# Patient Record
Sex: Female | Born: 1965 | Race: White | Hispanic: No | Marital: Married | Smoking: Never smoker
Health system: Southern US, Community
[De-identification: ages and names within clinical notes are randomized; demographics above are authoritative.]

## PROBLEM LIST (undated history)

## (undated) DIAGNOSIS — F32A Depression, unspecified: Secondary | ICD-10-CM

## (undated) DIAGNOSIS — I10 Essential (primary) hypertension: Secondary | ICD-10-CM

## (undated) DIAGNOSIS — E785 Hyperlipidemia, unspecified: Secondary | ICD-10-CM

## (undated) DIAGNOSIS — T7840XA Allergy, unspecified, initial encounter: Secondary | ICD-10-CM

## (undated) DIAGNOSIS — F419 Anxiety disorder, unspecified: Secondary | ICD-10-CM

## (undated) DIAGNOSIS — M109 Gout, unspecified: Secondary | ICD-10-CM

## (undated) HISTORY — PX: CHOLECYSTECTOMY: SHX55

## (undated) HISTORY — DX: Depression, unspecified: F32.A

## (undated) HISTORY — PX: OTHER SURGICAL HISTORY: SHX169

## (undated) HISTORY — DX: Anxiety disorder, unspecified: F41.9

## (undated) HISTORY — DX: Hyperlipidemia, unspecified: E78.5

## (undated) HISTORY — PX: NOSE SURGERY: SHX723

## (undated) HISTORY — DX: Allergy, unspecified, initial encounter: T78.40XA

## (undated) HISTORY — DX: Gout, unspecified: M10.9

## (undated) HISTORY — PX: VAGINAL HYSTERECTOMY: SUR661

## (undated) HISTORY — PX: ABDOMINAL HYSTERECTOMY: SHX81

## (undated) HISTORY — DX: Essential (primary) hypertension: I10

---

## 2004-12-30 ENCOUNTER — Emergency Department: Payer: Self-pay | Admitting: Emergency Medicine

## 2007-03-03 ENCOUNTER — Observation Stay: Payer: Self-pay | Admitting: Internal Medicine

## 2007-03-03 ENCOUNTER — Other Ambulatory Visit: Payer: Self-pay

## 2007-05-15 ENCOUNTER — Ambulatory Visit: Payer: Self-pay | Admitting: Internal Medicine

## 2007-06-01 ENCOUNTER — Ambulatory Visit: Payer: Self-pay | Admitting: General Surgery

## 2007-12-26 ENCOUNTER — Ambulatory Visit: Payer: Self-pay | Admitting: Internal Medicine

## 2008-04-01 ENCOUNTER — Ambulatory Visit: Payer: Self-pay | Admitting: Cardiology

## 2008-04-01 ENCOUNTER — Ambulatory Visit: Payer: Self-pay | Admitting: Unknown Physician Specialty

## 2008-04-11 ENCOUNTER — Ambulatory Visit: Payer: Self-pay | Admitting: Unknown Physician Specialty

## 2008-10-14 ENCOUNTER — Ambulatory Visit: Payer: Self-pay | Admitting: Unknown Physician Specialty

## 2008-11-05 ENCOUNTER — Ambulatory Visit: Payer: Self-pay | Admitting: Unknown Physician Specialty

## 2008-12-20 IMAGING — CR DG CHEST 2V
1 series · 2 of 2 positions shown · non-contrast
Comparison: none

REASON FOR EXAM: Hypertension
COMMENTS:

[Series 1: view not recorded · 0.17mm/px · 2 of 2 slices shown]
[im 1/2]
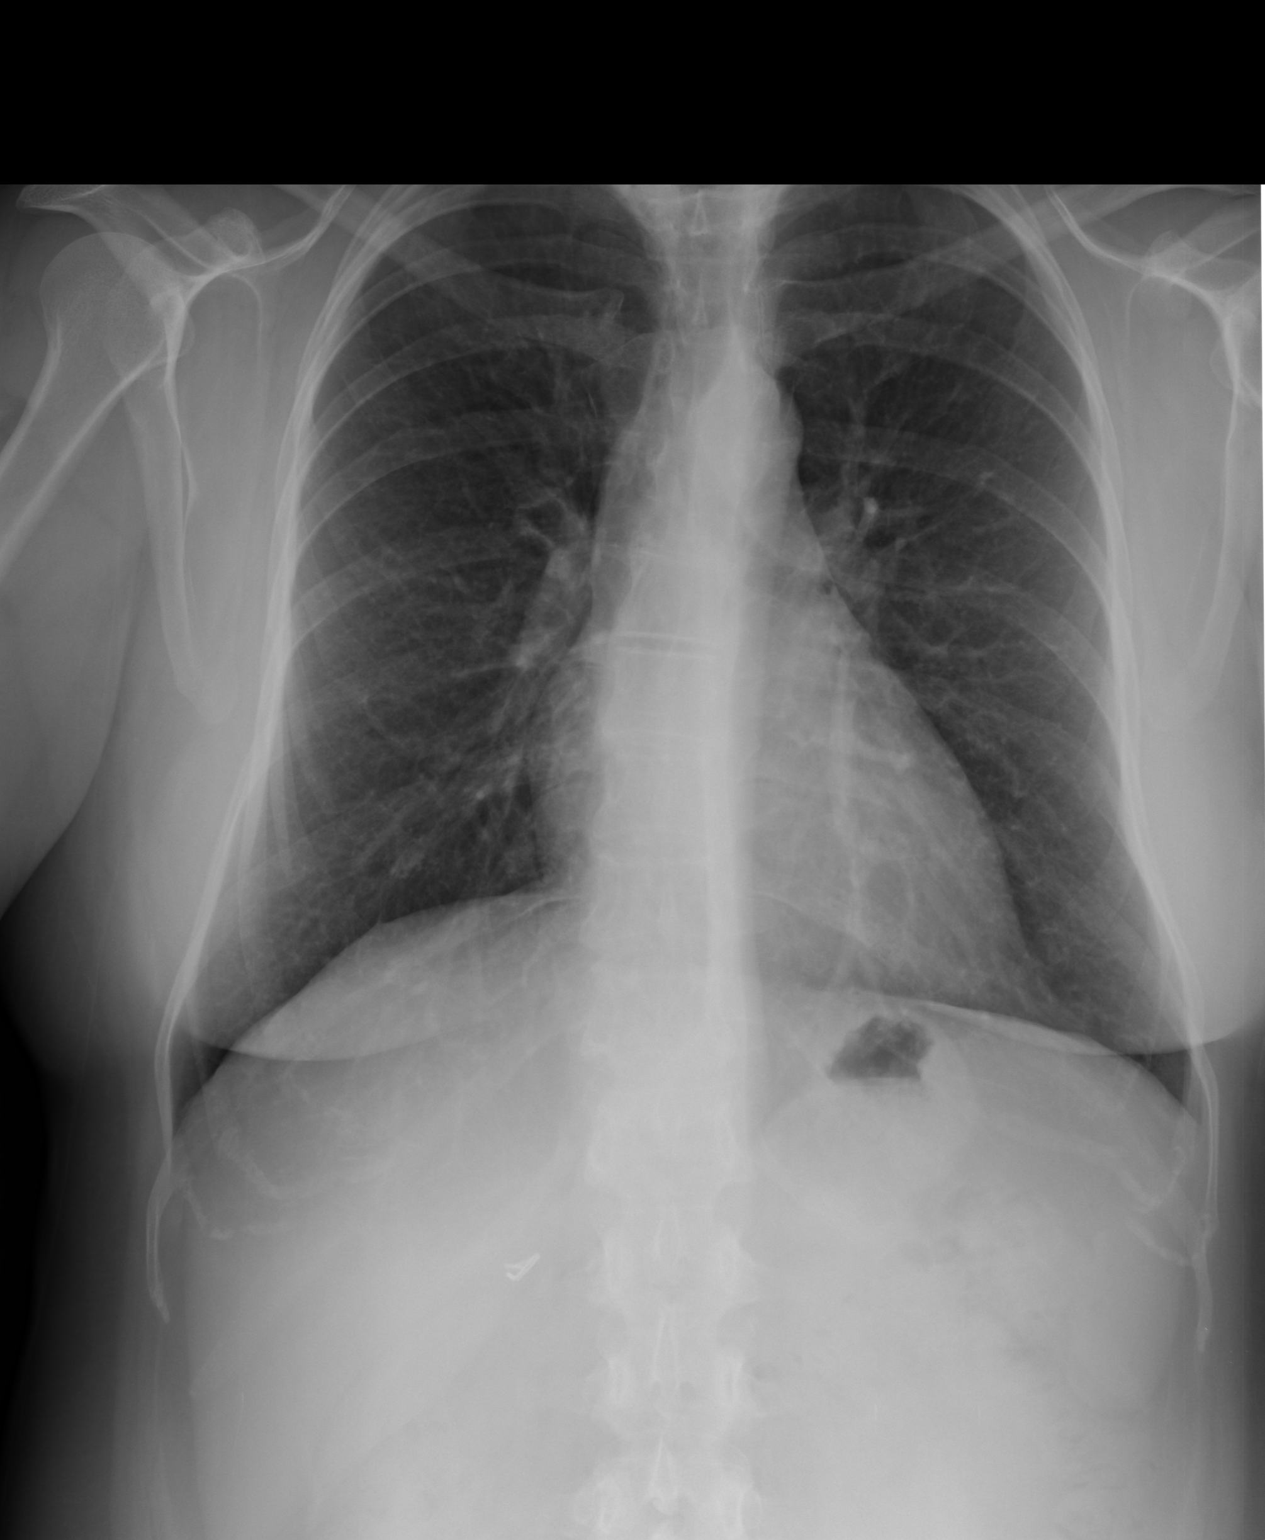
[im 2/2]
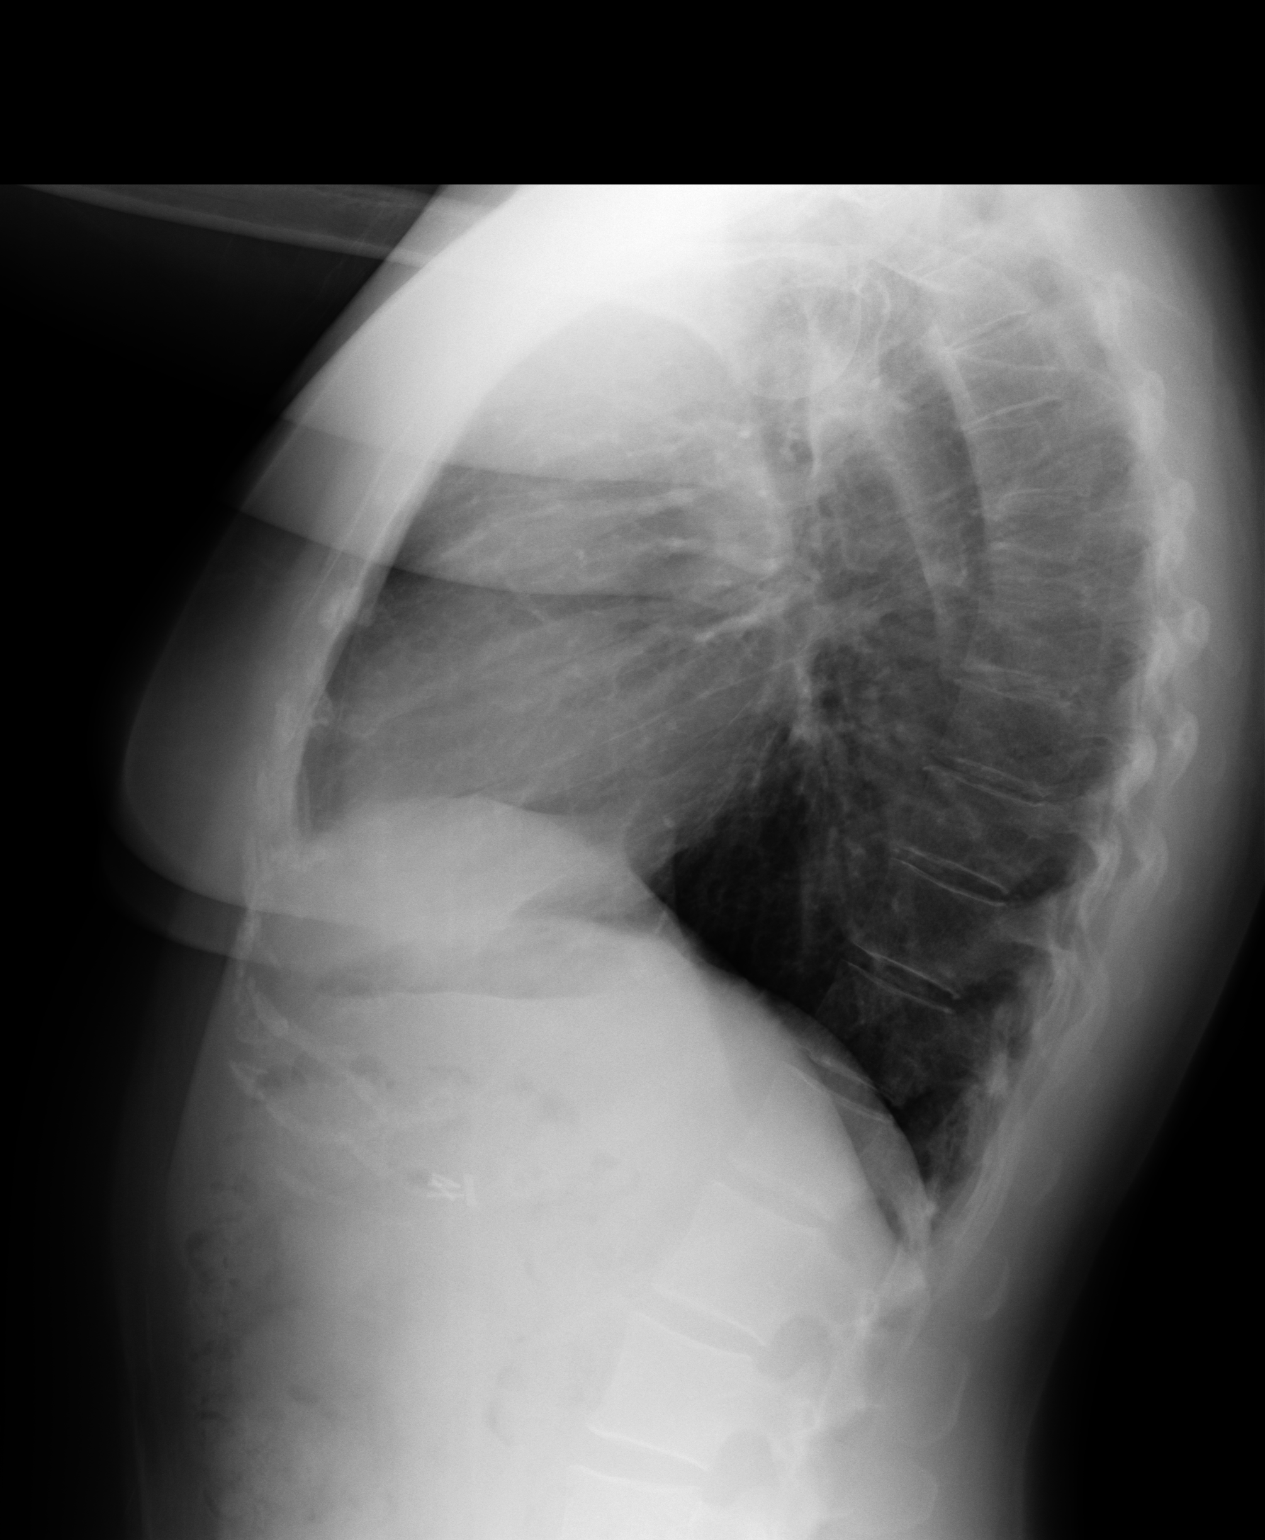

[2 of 2 positions shown; findings below may reference images not displayed]

PROCEDURE:     DXR - DXR CHEST PA (OR AP) AND LATERAL  - April 01, 2008 [DATE]

RESULT:     Comparison is made to the prior exam of 03/03/2007.

The lung fields are clear. The heart and pulmonary vasculature show no
significant abnormalities. The mediastinal and osseous structures show no
acute changes. Incidentally noted is a very slight thoracic scoliosis with
convexity to the RIGHT.
IMPRESSION: No acute changes are identified.

## 2010-02-05 ENCOUNTER — Ambulatory Visit: Payer: Self-pay | Admitting: Internal Medicine

## 2010-06-09 ENCOUNTER — Ambulatory Visit: Payer: Self-pay

## 2010-07-29 ENCOUNTER — Ambulatory Visit: Payer: Self-pay | Admitting: Internal Medicine

## 2010-08-03 ENCOUNTER — Ambulatory Visit: Payer: Self-pay

## 2011-03-16 ENCOUNTER — Ambulatory Visit: Payer: Self-pay | Admitting: Internal Medicine

## 2012-03-29 ENCOUNTER — Ambulatory Visit: Payer: Self-pay | Admitting: Anesthesiology

## 2012-03-29 DIAGNOSIS — I10 Essential (primary) hypertension: Secondary | ICD-10-CM

## 2012-03-31 ENCOUNTER — Ambulatory Visit: Payer: Self-pay | Admitting: General Surgery

## 2012-12-28 ENCOUNTER — Ambulatory Visit: Payer: Self-pay

## 2014-01-03 ENCOUNTER — Ambulatory Visit: Payer: Self-pay | Admitting: Physician Assistant

## 2014-09-10 NOTE — Op Note (Signed)
PATIENT NAME:  Lisa Osborn, LYNE MR#:  [id number] DATE OF BIRTH:  1965/08/08  DATE OF PROCEDURE:  03/31/2012  PREOPERATIVE DIAGNOSIS: Large skin cyst, low back area.   POSTOPERATIVE DIAGNOSIS: Large skin cyst, low back area.  PROCEDURE PERFORMED:  Excision of large skin cyst.   SURGEON: Mckinley Jewel, M.D.   ANESTHESIA: Monitored care with local anesthetic of 0.5% Marcaine and 1% Xylocaine used, total of 15 mL.   COMPLICATIONS: None.   ESTIMATED BLOOD LOSS: Minimal.   DRAINS: None.   DESCRIPTION OF PROCEDURE: The patient was placed in the prone position on the operating table, and with adequate sedation and monitoring the area of concern over the low back just to the right of the midline was prepped and draped out as a sterile field. The patient had an approximately 4 to 5 cm sized cystic mass attached to the skin. Local anesthetic was then instilled to obtain an adequate block all around this cystic area and in the skin. A transverse elliptical skin incision was made overlying the mass, and after the skin was incised the cyst was identified and carefully freed from the surrounding subcutaneous tissue, and it seemed to go to a depth of about 2 cm. It was circumferentially freed and excised out completely. Bleeding was controlled with cautery. The wound was irrigated and closed with 3-0 Vicryl in the deeper tissue. The skin closed with subcuticular 4-0 Vicryl. Steri-Strips, tincture of benzoin, and a sealed dressing was placed. The patient tolerated the procedure well. There were no immediate problems. She was returned to the recovery room in stable condition.  ____________________________ S.Robinette Haines, MD sgs:cbb D: 03/31/2012 08:11:01 ET T: 03/31/2012 11:45:38 ET JOB#: [id number] Robinette Haines MD ELECTRONICALLY SIGNED 04/10/2012 11:00

## 2016-06-21 ENCOUNTER — Other Ambulatory Visit: Payer: Self-pay | Admitting: Nurse Practitioner

## 2016-06-21 DIAGNOSIS — Z1231 Encounter for screening mammogram for malignant neoplasm of breast: Secondary | ICD-10-CM

## 2016-07-20 ENCOUNTER — Ambulatory Visit: Admission: RE | Admit: 2016-07-20 | Payer: BC Managed Care – PPO | Source: Ambulatory Visit

## 2016-07-27 ENCOUNTER — Other Ambulatory Visit: Payer: Self-pay | Admitting: Nurse Practitioner

## 2016-07-27 ENCOUNTER — Ambulatory Visit
Admission: RE | Admit: 2016-07-27 | Discharge: 2016-07-27 | Disposition: A | Discharge status: Home / Self Care | Payer: BC Managed Care – PPO | Source: Ambulatory Visit | Attending: Nurse Practitioner | Admitting: Nurse Practitioner

## 2016-07-27 DIAGNOSIS — Z1231 Encounter for screening mammogram for malignant neoplasm of breast: Secondary | ICD-10-CM

## 2017-05-29 ENCOUNTER — Other Ambulatory Visit: Payer: Self-pay | Admitting: Internal Medicine

## 2017-10-18 ENCOUNTER — Encounter: Payer: Self-pay | Admitting: Nurse Practitioner

## 2017-10-18 ENCOUNTER — Ambulatory Visit: Payer: BC Managed Care – PPO | Admitting: Nurse Practitioner

## 2017-10-18 VITALS — BP 129/78 | HR 93 | Temp 98.9°F | Resp 16 | Ht 63.0 in | Wt 138.6 lb

## 2017-10-18 DIAGNOSIS — J029 Acute pharyngitis, unspecified: Secondary | ICD-10-CM | POA: Diagnosis not present

## 2017-10-18 DIAGNOSIS — R059 Cough, unspecified: Secondary | ICD-10-CM

## 2017-10-18 DIAGNOSIS — J069 Acute upper respiratory infection, unspecified: Secondary | ICD-10-CM | POA: Diagnosis not present

## 2017-10-18 DIAGNOSIS — I10 Essential (primary) hypertension: Secondary | ICD-10-CM | POA: Diagnosis not present

## 2017-10-18 DIAGNOSIS — R6889 Other general symptoms and signs: Secondary | ICD-10-CM | POA: Diagnosis not present

## 2017-10-18 DIAGNOSIS — R05 Cough: Secondary | ICD-10-CM

## 2017-10-18 DIAGNOSIS — F5101 Primary insomnia: Secondary | ICD-10-CM | POA: Diagnosis not present

## 2017-10-18 LAB — POCT INFLUENZA A/B
INFLUENZA B, POC: NEGATIVE
Influenza A, POC: NEGATIVE

## 2017-10-18 LAB — POCT RAPID STREP A (OFFICE): Rapid Strep A Screen: NEGATIVE

## 2017-10-18 MED ORDER — HYDROCHLOROTHIAZIDE 12.5 MG PO TABS: 12.5000 mg | ORAL_TABLET | Freq: Every day | ORAL | 5 refills | Status: DC

## 2017-10-18 MED ORDER — ZOLPIDEM TARTRATE 10 MG PO TABS: 10.0000 mg | ORAL_TABLET | Freq: Every evening | ORAL | 3 refills | Status: DC | PRN

## 2017-10-18 MED ORDER — OSELTAMIVIR PHOSPHATE 75 MG PO CAPS: 75.0000 mg | ORAL_CAPSULE | Freq: Two times a day (BID) | ORAL | 0 refills | Status: DC

## 2017-10-18 MED ORDER — AZITHROMYCIN 250 MG PO TABS: ORAL_TABLET | ORAL | 0 refills | Status: DC

## 2017-10-18 MED ORDER — AMLODIPINE BESYLATE 10 MG PO TABS: 10.0000 mg | ORAL_TABLET | Freq: Every day | ORAL | 5 refills | Status: DC

## 2017-10-18 NOTE — Progress Notes (Signed)
[facility] [place], [place] [address]  Internal MEDICINE  Office Visit Note  Patient Name: Lisa Osborn  [id number]  [id number]  Date of Service: 10/18/2017   Pt is here for a sick visit.   Chief Complaint  Patient presents with  . URI    cough, fever, headache, otalgia, congestion, sore throat.     URI   This is a new problem. The current episode started in the past 7 days. The problem has been gradually worsening. The maximum temperature recorded prior to her arrival was 100.4 - 100.9 F. The fever has been present for 1 to 2 days. Associated symptoms include congestion, coughing, ear pain, headaches, nausea, a plugged ear sensation, rhinorrhea, sinus pain, a sore throat and swollen glands. Pertinent negatives include no chest pain, rash or vomiting. She has tried acetaminophen, decongestant and antihistamine for the symptoms. The treatment provided mild relief.       Current Medication:  Outpatient Encounter Medications as of 10/18/2017  Medication Sig  . amLODipine (NORVASC) 10 MG tablet Take 1 tablet (10 mg total) by mouth daily.  Marland Kitchen EPINEPHrine (EPIPEN 2-PAK) 0.3 mg/0.3 mL IJ SOAJ injection Inject into the muscle once.  . fluticasone (FLONASE) 50 MCG/ACT nasal spray Place into both nostrils daily.  Marland Kitchen zolpidem (AMBIEN) 10 MG tablet Take 1 tablet (10 mg total) by mouth at bedtime as needed for sleep.  . [DISCONTINUED] amLODipine (NORVASC) 10 MG tablet Take 10 mg by mouth daily.  . [DISCONTINUED] zolpidem (AMBIEN) 10 MG tablet Take 10 mg by mouth at bedtime as needed for sleep.  Marland Kitchen azithromycin (ZITHROMAX) 250 MG tablet z-pack - take as directed for 5 days  . hydrochlorothiazide (HYDRODIURIL) 12.5 MG tablet Take 1 tablet (12.5 mg total) by mouth daily.  Marland Kitchen oseltamivir (TAMIFLU) 75 MG capsule Take 1 capsule (75 mg total) by mouth 2 (two) times daily.  . [DISCONTINUED] hydrochlorothiazide (HYDRODIURIL) 12.5 MG tablet TAKE 1 TABLET(S) BY MOUTH DAILY FOR  HYPERTENSION   No facility-administered encounter medications on file as of 10/18/2017.       Medical History: Past Medical History:  Diagnosis Date  . Allergy   . Hypertension    Today's Vitals   10/18/17 1024  BP: 129/78  Pulse: 93  Resp: 16  Temp: 98.9 F (37.2 C)  SpO2: 99%  Weight: 138 lb 9.6 oz (62.9 kg)  Height: 5\' 3"  (1.6 m)    Review of Systems  Constitutional: Positive for activity change, chills, fatigue and fever.  HENT: Positive for congestion, ear pain, rhinorrhea, sinus pain and sore throat.   Eyes: Negative.   Respiratory: Positive for cough.   Cardiovascular: Negative for chest pain and palpitations.  Gastrointestinal: Positive for nausea. Negative for vomiting.  Endocrine: Negative for cold intolerance, heat intolerance, polydipsia, polyphagia and polyuria.  Genitourinary: Negative.   Musculoskeletal: Positive for arthralgias and myalgias.  Skin: Negative for rash.  Allergic/Immunologic: Positive for environmental allergies.  Neurological: Positive for headaches.  Psychiatric/Behavioral: Negative for dysphoric mood. The patient is not nervous/anxious.     Physical Exam  Constitutional: She is oriented to person, place, and time. She appears well-developed and well-nourished. She appears ill. No distress.  HENT:  Head: Normocephalic and atraumatic.  Right Ear: There is tenderness. Tympanic membrane is erythematous.  Left Ear: External ear and ear canal normal.  Nose: Rhinorrhea present. Right sinus exhibits maxillary sinus tenderness and frontal sinus tenderness.  Mouth/Throat: Mucous membranes are normal. Posterior oropharyngeal erythema present. No oropharyngeal exudate.  Copious post nasal drip present.   Eyes: Pupils are equal, round, and reactive to light. EOM are normal.  Neck: Normal range of motion. Neck supple. No JVD present. No tracheal deviation present. No thyromegaly present.  Cardiovascular: Normal rate, regular rhythm and normal  heart sounds. Exam reveals no gallop and no friction rub.  No murmur heard. Pulmonary/Chest: Effort normal and breath sounds normal. No respiratory distress. She has no wheezes. She has no rales. She exhibits no tenderness.  Congested, non-productive cough present.   Abdominal: Soft. Bowel sounds are normal. There is no tenderness.  Musculoskeletal: Normal range of motion.  Lymphadenopathy:    She has cervical adenopathy.  Neurological: She is alert and oriented to person, place, and time. No cranial nerve deficit.  Skin: Skin is warm and dry. She is not diaphoretic.  Psychiatric: She has a normal mood and affect. Her behavior is normal. Judgment and thought content normal.  Nursing note and vitals reviewed.  Assessment/Plan: 1. Sore throat - POCT strep A - NEGATIVE   2. Flu-like symptoms Flu test negative but will treat as though positive based on rapid onset and likely contacts with flu. tamiflu twice daily for 5 days. Rest, increase fluids. OTC meds to help with symptoms - oseltamivir (TAMIFLU) 75 MG capsule; Take 1 capsule (75 mg total) by mouth 2 (two) times daily.  Dispense: 10 capsule; Refill: 0  3. Acute upper respiratory infection z-pack. Take as directed for 5 days.  - azithromycin (ZITHROMAX) 250 MG tablet; z-pack - take as directed for 5 days  Dispense: 6 tablet; Refill: 0  4. Coughing - POCT rapid flu a/b - negative  5. Essential hypertension - amLODipine (NORVASC) 10 MG tablet; Take 1 tablet (10 mg total) by mouth daily.  Dispense: 30 tablet; Refill: 5 - hydrochlorothiazide (HYDRODIURIL) 12.5 MG tablet; Take 1 tablet (12.5 mg total) by mouth daily.  Dispense: 30 tablet; Refill: 5  6. Primary insomnia - zolpidem (AMBIEN) 10 MG tablet; Take 1 tablet (10 mg total) by mouth at bedtime as needed for sleep.  Dispense: 30 tablet; Refill: 3   General Counseling: Magaline verbalizes understanding of the findings of todays visit and agrees with plan of treatment. I have discussed  any further diagnostic evaluation that may be needed or ordered today. We also reviewed her medications today. she has been encouraged to call the office with any questions or concerns that should arise related to todays visit.  Rest and increase fluids. Continue using OTC medication to control symptoms.   This patient was seen by Leretha Pol, FNP- C in Collaboration with Dr Lavera Guise as a part of collaborative care agreement     Orders Placed This Encounter  Procedures  . POCT rapid strep A  . POCT Influenza A/B    Meds ordered this encounter  Medications  . azithromycin (ZITHROMAX) 250 MG tablet    Sig: z-pack - take as directed for 5 days    Dispense:  6 tablet    Refill:  0    Order Specific Question:   Supervising Provider    Answer:   Lavera Guise Quinn  . oseltamivir (TAMIFLU) 75 MG capsule    Sig: Take 1 capsule (75 mg total) by mouth 2 (two) times daily.    Dispense:  10 capsule    Refill:  0    Order Specific Question:   Supervising Provider    Answer:   Lavera Guise [[id number]]  . amLODipine (NORVASC) 10 MG tablet  Sig: Take 1 tablet (10 mg total) by mouth daily.    Dispense:  30 tablet    Refill:  5    Order Specific Question:   Supervising Provider    Answer:   Lavera Guise [[id number]]  . zolpidem (AMBIEN) 10 MG tablet    Sig: Take 1 tablet (10 mg total) by mouth at bedtime as needed for sleep.    Dispense:  30 tablet    Refill:  3    Please do not run with insurance. Will have [place] card.    Order Specific Question:   Supervising Provider    Answer:   Lavera Guise [[id number]]  . hydrochlorothiazide (HYDRODIURIL) 12.5 MG tablet    Sig: Take 1 tablet (12.5 mg total) by mouth daily.    Dispense:  30 tablet    Refill:  5    Order Specific Question:   Supervising Provider    Answer:   Lavera Guise [[id number]]    Time spent: 20 Minutes

## 2017-12-27 ENCOUNTER — Ambulatory Visit: Payer: BC Managed Care – PPO | Admitting: Nurse Practitioner

## 2017-12-27 ENCOUNTER — Encounter: Payer: Self-pay | Admitting: Nurse Practitioner

## 2017-12-27 VITALS — BP 138/81 | HR 75 | Resp 16 | Ht 63.0 in | Wt 142.0 lb

## 2017-12-27 DIAGNOSIS — I10 Essential (primary) hypertension: Secondary | ICD-10-CM | POA: Diagnosis not present

## 2017-12-27 DIAGNOSIS — Z1239 Encounter for other screening for malignant neoplasm of breast: Secondary | ICD-10-CM

## 2017-12-27 DIAGNOSIS — F410 Panic disorder [episodic paroxysmal anxiety] without agoraphobia: Secondary | ICD-10-CM | POA: Diagnosis not present

## 2017-12-27 DIAGNOSIS — Z1231 Encounter for screening mammogram for malignant neoplasm of breast: Secondary | ICD-10-CM

## 2017-12-27 MED ORDER — ALPRAZOLAM 0.25 MG PO TABS: 0.2500 mg | ORAL_TABLET | Freq: Two times a day (BID) | ORAL | 1 refills | Status: DC | PRN
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
## 2018-01-06 DIAGNOSIS — F410 Panic disorder [episodic paroxysmal anxiety] without agoraphobia: Secondary | ICD-10-CM | POA: Insufficient documentation

## 2018-01-06 DIAGNOSIS — Z0001 Encounter for general adult medical examination with abnormal findings: Secondary | ICD-10-CM | POA: Insufficient documentation

## 2018-01-06 DIAGNOSIS — Z1239 Encounter for other screening for malignant neoplasm of breast: Secondary | ICD-10-CM

## 2018-03-15 ENCOUNTER — Other Ambulatory Visit: Payer: Self-pay

## 2018-03-15 DIAGNOSIS — I10 Essential (primary) hypertension: Secondary | ICD-10-CM

## 2018-03-15 MED ORDER — HYDROCHLOROTHIAZIDE 12.5 MG PO TABS: 12.5000 mg | ORAL_TABLET | Freq: Every day | ORAL | 5 refills | Status: DC

## 2018-05-15 ENCOUNTER — Ambulatory Visit
Admission: EM | Admit: 2018-05-15 | Discharge: 2018-05-15 | Disposition: A | Discharge status: Home / Self Care | Payer: BC Managed Care – PPO | Attending: Family Medicine | Admitting: Family Medicine

## 2018-05-15 ENCOUNTER — Encounter: Payer: Self-pay | Admitting: Emergency Medicine

## 2018-05-15 ENCOUNTER — Other Ambulatory Visit: Payer: Self-pay

## 2018-05-15 DIAGNOSIS — B9789 Other viral agents as the cause of diseases classified elsewhere: Secondary | ICD-10-CM

## 2018-05-15 DIAGNOSIS — J029 Acute pharyngitis, unspecified: Secondary | ICD-10-CM | POA: Diagnosis not present

## 2018-05-15 MED ORDER — DEXAMETHASONE SODIUM PHOSPHATE 10 MG/ML IJ SOLN
10.0000 mg | Freq: Once | INTRAMUSCULAR | Status: AC
  Administered 2018-05-15: 10 mg via INTRAMUSCULAR

## 2018-05-15 NOTE — Discharge Instructions (Addendum)
Continue medication as prescribed. Rest. Drink plenty of fluids.   Follow up with your primary care physician this week as needed. Return to Urgent care for new or worsening concerns.

## 2018-05-15 NOTE — ED Triage Notes (Signed)
Patient in today c/o sore throat, headache, body aches x 3 days. Patient saw [facility] on Saturday (05/13/18) and was tested for flu and strep. Flu was negative and strep was positive. Patient PCN 500 mg 1 bid x 10 days. Patient states she is not any better.

## 2018-05-15 NOTE — ED Provider Notes (Signed)
[facility] ____________________________________________  Time seen: Approximately 12:58 PM  I have reviewed the triage vital signs and the nursing notes.   HISTORY  Chief Complaint Sore Throat and Generalized Body Aches   HPI Lisa Osborn is a 52 y.o. female presenting for reevaluation of continued sore throat.  Patient reports this past Friday she began having sore throat, body aches and some rundown feeling as well as possible low-grade fevers.  States that [facility] was [facility] had influenza test that was negative but was positive for strep throat.  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  Patient reports that she was then started on penicillin 500 mg twice a day.  States that she has had 5 doses of antibiotic but continues with sore throat, fatigue and overall not feeling well prompting her to come in.  Continues to drink fluids well, painful to swallow and eat.  No accompanying cough or nasal congestion.  Denies chest pain, shortness of breath abdominal pain.  Denies other aggravating alleviating factors.  Lavera Guise, MD: PCP    Past Medical History:  Diagnosis Date  . Allergy   . Hypertension     Patient Active Problem List   Diagnosis Date Noted  . Panic disorder 01/06/2018  . Screening for breast cancer 01/06/2018  . Sore throat 10/18/2017  . Flu-like symptoms 10/18/2017  . Acute upper respiratory infection 10/18/2017  . Coughing 10/18/2017  . Essential hypertension 10/18/2017  . Primary insomnia 10/18/2017    Past Surgical History:  Procedure Laterality Date  . ABDOMINAL HYSTERECTOMY       No current facility-administered medications for this encounter.   Current Outpatient Medications:  .  ALPRAZolam (XANAX) 0.25 MG tablet, Take 1 tablet (0.25 mg total) by mouth 2 (two) times daily as needed for anxiety., Disp: 45 tablet, Rfl: 1 .  amLODipine (NORVASC) 10 MG  tablet, Take 1 tablet (10 mg total) by mouth daily., Disp: 30 tablet, Rfl: 5 .  EPINEPHrine (EPIPEN 2-PAK) 0.3 mg/0.3 mL IJ SOAJ injection, Inject into the muscle once., Disp: , Rfl:  .  hydrochlorothiazide (HYDRODIURIL) 12.5 MG tablet, Take 1 tablet (12.5 mg total) by mouth daily., Disp: 30 tablet, Rfl: 5 .  zolpidem (AMBIEN) 10 MG tablet, Take 1 tablet (10 mg total) by mouth at bedtime as needed for sleep., Disp: 30 tablet, Rfl: 3  Allergies Bee venom; Shellfish allergy; Compazine  [prochlorperazine edisylate]; and Codeine  Family History  Problem Relation Age of Onset  . Breast cancer Mother   . Hyperlipidemia Mother   . Breast cancer Paternal Aunt   . Breast cancer Maternal Grandmother   . Breast cancer Cousin        pat cousin  . Cancer Father   . Hyperlipidemia Father   . Hypertension Father   . Brain cancer Brother     Social History Social History   Tobacco Use  . Smoking status: Never Smoker  . Smokeless tobacco: Never Used  Substance Use Topics  . Alcohol use: Yes    Comment: socially   . Drug use: Never    Review of Systems Constitutional: Body aches. Recent fever.  ENT: positive sore throat. Cardiovascular: Denies chest pain. Respiratory: Denies shortness of breath. Gastrointestinal: No abdominal pain.   Musculoskeletal: Negative for back pain. Skin: Negative for rash.  ____________________________________________   PHYSICAL EXAM:  VITAL SIGNS: ED Triage Vitals [05/15/18 1213]  Enc Vitals Group     BP (!) 129/93     Pulse Rate 93     Resp 16     Temp 98.6 F (37 C)     Temp Source Oral     SpO2 99 %     Weight 139 lb (63 kg)     Height 5\' 3"  (1.6 m)     Head Circumference      Peak Flow      Pain Score 8     Pain Loc      Pain Edu?      Excl. in [place]?     Constitutional: Alert and oriented. Well appearing and in no acute distress. Eyes: Conjunctivae are normal.  Head: Atraumatic. No sinus tenderness to palpation. No swelling. No  erythema.  Ears: no erythema, normal TMs bilaterally.   Nose:No nasal congestion  Mouth/Throat: Mucous membranes are moist. Moderate pharyngeal erythema.  Tonsillectomy. Neck: No stridor.  No cervical spine tenderness to palpation. Hematological/Lymphatic/Immunilogical: Bilateral anterior cervical lymphadenopathy. Cardiovascular: Normal rate, regular rhythm. Grossly normal heart sounds.  Good peripheral circulation. Respiratory: Normal respiratory effort.  No retractions. No wheezes, rales or rhonchi. Good air movement.  Musculoskeletal: Ambulatory with steady gait. No cervical, thoracic or lumbar tenderness to palpation. Neurologic:  Normal speech and language. No gait instability. Skin:  Skin appears warm, dry and intact. No rash noted. Psychiatric: Mood and affect are normal. Speech and behavior are normal.  ___________________________________________   LABS (all labs ordered are listed, but only abnormal results are displayed)  Labs Reviewed - No data to display   PROCEDURES Procedures    INITIAL IMPRESSION / ASSESSMENT AND PLAN / ED COURSE  Pertinent labs & imaging results that were available during my care of the patient were reviewed by me and considered in my medical decision making (see chart for details).  Overall well-appearing patient.  Recent positive diagnosis of strep and currently on penicillin.  Continue antibiotic.  10 mg IM Decadron given once in urgent care.  Encourage rest, fluids, supportive care.Discussed indication, risks and benefits of medications with patient.  Discussed follow up with Primary care physician this week. Discussed follow up and return parameters including no resolution or any worsening concerns. Patient verbalized understanding and agreed to plan.   ____________________________________________   FINAL CLINICAL IMPRESSION(S) / ED DIAGNOSES  Final diagnoses:  Pharyngitis, unspecified etiology     ED Discharge Orders    None        Note: This dictation was prepared with Dragon dictation along with smaller phrase technology. Any transcriptional errors that result from this process are unintentional.         Marylene Land, NP 05/15/18 1404

## 2018-05-23 ENCOUNTER — Encounter: Payer: Self-pay | Admitting: Nurse Practitioner

## 2018-05-23 ENCOUNTER — Ambulatory Visit: Payer: BC Managed Care – PPO | Admitting: Nurse Practitioner

## 2018-05-23 VITALS — BP 134/80 | HR 84 | Resp 16 | Ht 63.0 in | Wt 138.6 lb

## 2018-05-23 DIAGNOSIS — F5101 Primary insomnia: Secondary | ICD-10-CM | POA: Diagnosis not present

## 2018-05-23 DIAGNOSIS — J029 Acute pharyngitis, unspecified: Secondary | ICD-10-CM | POA: Diagnosis not present

## 2018-05-23 MED ORDER — ZOLPIDEM TARTRATE 10 MG PO TABS: 10.0000 mg | ORAL_TABLET | Freq: Every evening | ORAL | 3 refills | Status: DC | PRN

## 2018-05-23 NOTE — Progress Notes (Signed)
[facility] [place], [place] [address]  Internal MEDICINE  Office Visit Note  Patient Name: Lisa Osborn  [id number]  [id number]  Date of Service: 05/23/2018  Chief Complaint  Patient presents with  . Medical Management of Chronic Issues    medication refill     The patient is here for routine follow up. She is currently on penicillin for strep throat. 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 She is feeling much better. Has one day left for her antibiotics.  She needs to have refills for her ambien. Takes this at night as needed. Helps her to fall asleep and stay asleep. Is able to wake up and function without grogginess the next day.       Current Medication: Outpatient Encounter Medications as of 05/23/2018  Medication Sig  . ALPRAZolam (XANAX) 0.25 MG tablet Take 1 tablet (0.25 mg total) by mouth 2 (two) times daily as needed for anxiety.  Marland Kitchen amLODipine (NORVASC) 10 MG tablet Take 1 tablet (10 mg total) by mouth daily.  Marland Kitchen EPINEPHrine (EPIPEN 2-PAK) 0.3 mg/0.3 mL IJ SOAJ injection Inject into the muscle once.  . hydrochlorothiazide (HYDRODIURIL) 12.5 MG tablet Take 1 tablet (12.5 mg total) by mouth daily.  Marland Kitchen zolpidem (AMBIEN) 10 MG tablet Take 1 tablet (10 mg total) by mouth at bedtime as needed for sleep.  . [DISCONTINUED] zolpidem (AMBIEN) 10 MG tablet Take 1 tablet (10 mg total) by mouth at bedtime as needed for sleep.   No facility-administered encounter medications on file as of 05/23/2018.     Surgical History: Past Surgical History:  Procedure Laterality Date  . ABDOMINAL HYSTERECTOMY      Medical History: Past Medical History:  Diagnosis Date  . Allergy   . Hypertension     Family History: Family History  Problem Relation Age of Onset  . Breast cancer Mother   . Hyperlipidemia Mother   . Breast cancer Paternal Aunt   . Breast cancer Maternal Grandmother   . Breast  cancer Cousin        pat cousin  . Cancer Father   . Hyperlipidemia Father   . Hypertension Father   . Brain cancer Brother     Social History   Socioeconomic History  . Marital status: Married    Spouse name: Not on file  . Number of children: Not on file  . Years of education: Not on file  . Highest education level: Not on file  Occupational History  . Not on file  Social Needs  . Financial resource strain: Not on file  . Food insecurity:    Worry: Not on file    Inability: Not on file  . Transportation needs:    Medical: Not on file    Non-medical: Not on file  Tobacco Use  . Smoking status: Never Smoker  . Smokeless tobacco: Never Used  Substance and Sexual Activity  . Alcohol use: Yes    Comment: socially   . Drug use: Never  . Sexual activity: Not on file  Lifestyle  . Physical activity:    Days per week: Not on file    Minutes per session: Not on file  . Stress: Not on file  Relationships  . Social connections:    Talks on phone: Not on file    Gets together: Not on file    Attends religious service: Not on file    Active  member of club or organization: Not on file    Attends meetings of clubs or organizations: Not on file    Relationship status: Not on file  . Intimate partner violence:    Fear of current or ex partner: Not on file    Emotionally abused: Not on file    Physically abused: Not on file    Forced sexual activity: Not on file  Other Topics Concern  . Not on file  Social History Narrative  . Not on file      Review of Systems  Constitutional: Negative for activity change, chills, fatigue and fever.  HENT: Negative for congestion, ear pain, rhinorrhea, sinus pain and sore throat.   Respiratory: Negative for cough, chest tightness, shortness of breath and wheezing.   Cardiovascular: Negative for chest pain and palpitations.  Gastrointestinal: Negative for nausea and vomiting.  Genitourinary: Negative.   Musculoskeletal: Positive for  myalgias. Negative for arthralgias.  Skin: Negative for rash.  Allergic/Immunologic: Negative for environmental allergies.  Neurological: Negative for headaches.  Hematological: Negative for adenopathy.  Psychiatric/Behavioral: Positive for sleep disturbance. Negative for dysphoric mood.    Vital Signs: BP 134/80 (BP Location: Left Arm, Patient Position: Sitting, Cuff Size: Large)   Pulse 84   Resp 16   Ht 5\' 3"  (1.6 m)   Wt 138 lb 9.6 oz (62.9 kg)   SpO2 99%   BMI 24.55 kg/m    Physical Exam Vitals signs and nursing note reviewed.  Constitutional:      General: She is not in acute distress.    Appearance: Normal appearance. She is well-developed. She is not diaphoretic.  HENT:     Head: Normocephalic and atraumatic.     Mouth/Throat:     Pharynx: No oropharyngeal exudate.  Eyes:     Extraocular Movements: Extraocular movements intact.     Pupils: Pupils are equal, round, and reactive to light.  Neck:     Thyroid: No thyromegaly.     Vascular: No JVD.     Trachea: No tracheal deviation.  Cardiovascular:     Rate and Rhythm: Normal rate and regular rhythm.     Heart sounds: Normal heart sounds. No murmur. No friction rub. No gallop.   Pulmonary:     Effort: Pulmonary effort is normal. No respiratory distress.     Breath sounds: Normal breath sounds. No wheezing or rales.  Chest:     Chest wall: No tenderness.  Abdominal:     General: Bowel sounds are normal.     Palpations: Abdomen is soft.  Lymphadenopathy:     Cervical: No cervical adenopathy.  Skin:    General: Skin is warm and dry.  Neurological:     Mental Status: She is alert and oriented to person, place, and time.     Cranial Nerves: No cranial nerve deficit.  Psychiatric:        Behavior: Behavior normal.        Thought Content: Thought content normal.        Judgment: Judgment normal.    Assessment/Plan: 1. Sore throat Diagnosed with strep throat. Improving. Continue treatment with penicillin  until antibiotics gone.   2. Primary insomnia Refilled ambien. Take at night as needed. Refills sent to her pharmacy today.  - zolpidem (AMBIEN) 10 MG tablet; Take 1 tablet (10 mg total) by mouth at bedtime as needed for sleep.  Dispense: 30 tablet; Refill: 3  General Counseling: [name] understanding of the findings of todays visit and agrees  with plan of treatment. I have discussed any further diagnostic evaluation that may be needed or ordered today. We also reviewed her medications today. she has been encouraged to call the office with any questions or concerns that should arise related to todays visit.  This patient was seen by [place] with Dr Lavera Guise as a part of collaborative care agreement  Meds ordered this encounter  Medications  . zolpidem (AMBIEN) 10 MG tablet    Sig: Take 1 tablet (10 mg total) by mouth at bedtime as needed for sleep.    Dispense:  30 tablet    Refill:  3    Please do not run with insurance. Will have [facility] card.    Order Specific Question:   Supervising Provider    Answer:   Lavera Guise [[id number]]    Time spent: 73 Minutes      Dr Lavera Guise Internal medicine

## 2018-05-24 DIAGNOSIS — M109 Gout, unspecified: Secondary | ICD-10-CM

## 2018-05-24 HISTORY — DX: Gout, unspecified: M10.9

## 2018-05-26 ENCOUNTER — Other Ambulatory Visit: Payer: Self-pay

## 2018-05-26 MED ORDER — OSELTAMIVIR PHOSPHATE 75 MG PO CAPS: 75.0000 mg | ORAL_CAPSULE | Freq: Every day | ORAL | 0 refills | Status: AC

## 2018-06-30 ENCOUNTER — Encounter: Payer: Self-pay | Admitting: Nurse Practitioner

## 2018-07-17 ENCOUNTER — Other Ambulatory Visit: Payer: Self-pay

## 2018-07-17 DIAGNOSIS — I10 Essential (primary) hypertension: Secondary | ICD-10-CM

## 2018-07-17 MED ORDER — AMLODIPINE BESYLATE 10 MG PO TABS: 10.0000 mg | ORAL_TABLET | Freq: Every day | ORAL | 3 refills | Status: DC

## 2018-07-28 ENCOUNTER — Other Ambulatory Visit: Payer: Self-pay

## 2018-07-28 ENCOUNTER — Ambulatory Visit (INDEPENDENT_AMBULATORY_CARE_PROVIDER_SITE_OTHER): Payer: BC Managed Care – PPO | Admitting: Nurse Practitioner

## 2018-07-28 ENCOUNTER — Encounter: Payer: Self-pay | Admitting: Nurse Practitioner

## 2018-07-28 VITALS — BP 130/75 | HR 79 | Temp 97.9°F | Resp 16 | Ht 65.0 in | Wt 138.6 lb

## 2018-07-28 DIAGNOSIS — F5101 Primary insomnia: Secondary | ICD-10-CM

## 2018-07-28 DIAGNOSIS — J069 Acute upper respiratory infection, unspecified: Secondary | ICD-10-CM

## 2018-07-28 DIAGNOSIS — R52 Pain, unspecified: Secondary | ICD-10-CM

## 2018-07-28 DIAGNOSIS — R05 Cough: Secondary | ICD-10-CM

## 2018-07-28 DIAGNOSIS — R059 Cough, unspecified: Secondary | ICD-10-CM

## 2018-07-28 DIAGNOSIS — R6889 Other general symptoms and signs: Secondary | ICD-10-CM

## 2018-07-28 LAB — POCT INFLUENZA A/B
INFLUENZA A, POC: NEGATIVE
INFLUENZA B, POC: NEGATIVE

## 2018-07-28 MED ORDER — AZITHROMYCIN 250 MG PO TABS: ORAL_TABLET | ORAL | 0 refills | Status: DC

## 2018-07-28 MED ORDER — HYDROCOD POLST-CPM POLST ER 10-8 MG/5ML PO SUER
5.0000 mL | Freq: Two times a day (BID) | ORAL | 0 refills | Status: DC | PRN
Start: 2018-07-28 — End: 2018-12-18

## 2018-07-28 MED ORDER — ZOLPIDEM TARTRATE 10 MG PO TABS: 10.0000 mg | ORAL_TABLET | Freq: Every evening | ORAL | 3 refills | Status: DC | PRN

## 2018-07-28 NOTE — Progress Notes (Signed)
[facility] [place], [place] [address]  Internal MEDICINE  Office Visit Note  Patient Name: Lisa Osborn  [id number]  [id number]  Date of Service: 08/12/2018   Pt is here for a sick visit.  Chief Complaint  Patient presents with  . Diarrhea  . Cough    pt works in the school system, [place] got over strep throat not too long ago, there has been positive cases of the flu, pt had not noticed a fever but does state that she s having body aches, and had a little diarrhea  . 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 Three were type B and one was Type A.   Cough  This is a new problem. The current episode started in the past 7 days. The problem has been rapidly worsening. The problem occurs every few minutes. The cough is non-productive. Associated symptoms include chills, ear pain, headaches, myalgias, nasal congestion, postnasal drip, rhinorrhea and a sore throat. Pertinent negatives include no chest pain, fever or wheezing. She has tried OTC cough suppressant for the symptoms. The treatment provided mild relief.        Current Medication:  Outpatient Encounter Medications as of 07/28/2018  Medication Sig  . amLODipine (NORVASC) 10 MG tablet Take 1 tablet (10 mg total) by mouth daily.  Marland Kitchen EPINEPHrine (EPIPEN 2-PAK) 0.3 mg/0.3 mL IJ SOAJ injection Inject into the muscle once.  . hydrochlorothiazide (HYDRODIURIL) 12.5 MG tablet Take 1 tablet (12.5 mg total) by mouth daily.  Marland Kitchen zolpidem (AMBIEN) 10 MG tablet Take 1 tablet (10 mg total) by mouth at bedtime as needed for sleep.  . [DISCONTINUED] zolpidem (AMBIEN) 10 MG tablet Take 1 tablet (10 mg total) by mouth at bedtime as needed for sleep.  Marland Kitchen ALPRAZolam (XANAX) 0.25 MG tablet Take 1 tablet (0.25 mg total) by mouth 2 (two) times daily as needed for anxiety. (Patient not taking: Reported on 07/28/2018)  . azithromycin (ZITHROMAX) 250 MG  tablet z-pack - take as directed for 5 days  . chlorpheniramine-HYDROcodone (TUSSIONEX PENNKINETIC ER) 10-8 MG/5ML SUER Take 5 mLs by mouth every 12 (twelve) hours as needed for cough.   No facility-administered encounter medications on file as of 07/28/2018.       Medical History: Past Medical History:  Diagnosis Date  . Allergy   . Gout   . Hypertension     Today's Vitals   07/28/18 1521  BP: 130/75  Pulse: 79  Resp: 16  Temp: 97.9 F (36.6 C)  SpO2: 99%  Weight: 138 lb 9.6 oz (62.9 kg)  Height: 5\' 5"  (1.651 m)   Body mass index is 23.06 kg/m.  Review of Systems  Constitutional: Positive for chills and fatigue. Negative for fever.  HENT: Positive for congestion, ear pain, postnasal drip, rhinorrhea, sinus pain, sore throat and voice change.   Respiratory: Positive for cough. Negative for wheezing.   Cardiovascular: Negative for chest pain and palpitations.  Gastrointestinal: Positive for nausea.  Endocrine: Negative.   Musculoskeletal: Positive for myalgias.  Skin: Negative.   Allergic/Immunologic: Negative.   Neurological: Positive for headaches.  Hematological: Positive for adenopathy.    Physical Exam Vitals signs and nursing note reviewed.  Constitutional:      General: She is not in acute distress.    Appearance: She is well-developed. She is ill-appearing. She is not diaphoretic.  HENT:     Head: Normocephalic and  atraumatic.     Right Ear: Tympanic membrane is bulging.     Left Ear: Tympanic membrane is bulging.     Nose: Congestion present.     Right Turbinates: Swollen.     Left Turbinates: Swollen.     Right Sinus: Maxillary sinus tenderness and frontal sinus tenderness present.     Left Sinus: Maxillary sinus tenderness and frontal sinus tenderness present.     Mouth/Throat:     Pharynx: Posterior oropharyngeal erythema present. No oropharyngeal exudate.  Eyes:     Pupils: Pupils are equal, round, and reactive to light.  Neck:      Musculoskeletal: Normal range of motion and neck supple.     Thyroid: No thyromegaly.     Vascular: No JVD.     Trachea: No tracheal deviation.  Cardiovascular:     Rate and Rhythm: Normal rate and regular rhythm.     Heart sounds: Normal heart sounds. No murmur. No friction rub. No gallop.   Pulmonary:     Effort: Pulmonary effort is normal. No respiratory distress.     Breath sounds: Wheezing present. No rales.     Comments: Congested, non-productive cough. Chest:     Chest wall: No tenderness.  Abdominal:     General: Bowel sounds are normal.     Palpations: Abdomen is soft.  Musculoskeletal: Normal range of motion.  Lymphadenopathy:     Cervical: Cervical adenopathy present.  Skin:    General: Skin is warm and dry.  Neurological:     Mental Status: She is alert and oriented to person, place, and time.     Cranial Nerves: No cranial nerve deficit.  Psychiatric:        Behavior: Behavior normal.        Thought Content: Thought content normal.        Judgment: Judgment normal.   Assessment/Plan: 1. Acute upper respiratory infection Start z-pack. Take as directed for 5 days. Rest and increase fluids. Recommend use of OTC medication to improve symptoms.  - azithromycin (ZITHROMAX) 250 MG tablet; z-pack - take as directed for 5 days  Dispense: 6 tablet; Refill: 0  2. Flu-like symptoms Flu swab negative today. Advised patient to continue to monitor for symptoms.   3. Body aches - POCT Influenza A/B negative   4. Coughing May take tussionex twice daily as needed for cough. Advised patient not to overuse this medicine and not to mix with other medications or alcohol as it can cause respiratory distress, sleepiness or dizziness. Should also avoid driving. Patient voiced understanding and agreement.  - chlorpheniramine-HYDROcodone (TUSSIONEX PENNKINETIC ER) 10-8 MG/5ML SUER; Take 5 mLs by mouth every 12 (twelve) hours as needed for cough.  Dispense: 115 mL; Refill: 0  5. Primary  insomnia Ok to continue zolpidem 10mg  at bedtime as needed for acute insomnia. New prescription sent to pharmacy today.  - zolpidem (AMBIEN) 10 MG tablet; Take 1 tablet (10 mg total) by mouth at bedtime as needed for sleep.  Dispense: 30 tablet; Refill: 3  General Counseling: Aneth verbalizes understanding of the findings of todays visit and agrees with plan of treatment. I have discussed any further diagnostic evaluation that may be needed or ordered today. We also reviewed her medications today. she has been encouraged to call the office with any questions or concerns that should arise related to todays visit.    Counseling:  Rest and increase fluids. Continue using OTC medication to control symptoms.   This patient was seen by  Leretha Pol FNP Collaboration with Dr Lavera Guise as a part of collaborative care agreement  Orders Placed This Encounter  Procedures  . POCT Influenza A/B    Meds ordered this encounter  Medications  . azithromycin (ZITHROMAX) 250 MG tablet    Sig: z-pack - take as directed for 5 days    Dispense:  6 tablet    Refill:  0    Order Specific Question:   Supervising Provider    Answer:   Lavera Guise [place]  . chlorpheniramine-HYDROcodone (TUSSIONEX PENNKINETIC ER) 10-8 MG/5ML SUER    Sig: Take 5 mLs by mouth every 12 (twelve) hours as needed for cough.    Dispense:  115 mL    Refill:  0    Order Specific Question:   Supervising Provider    Answer:   Lavera Guise [[id number]]  . zolpidem (AMBIEN) 10 MG tablet    Sig: Take 1 tablet (10 mg total) by mouth at bedtime as needed for sleep.    Dispense:  30 tablet    Refill:  3    Please do not run with insurance. Will have [facility] card.    Order Specific Question:   Supervising Provider    Answer:   Lavera Guise [[id number]]    Time spent: 25 Minutes

## 2018-08-12 DIAGNOSIS — R52 Pain, unspecified: Secondary | ICD-10-CM | POA: Insufficient documentation

## 2018-09-14 ENCOUNTER — Other Ambulatory Visit: Payer: Self-pay | Admitting: Nurse Practitioner

## 2018-09-14 DIAGNOSIS — I10 Essential (primary) hypertension: Secondary | ICD-10-CM

## 2018-09-14 MED ORDER — HYDROCHLOROTHIAZIDE 12.5 MG PO TABS: 12.5000 mg | ORAL_TABLET | Freq: Every day | ORAL | 5 refills | Status: DC

## 2018-11-30 ENCOUNTER — Encounter: Payer: Self-pay | Admitting: Nurse Practitioner

## 2018-12-18 ENCOUNTER — Ambulatory Visit: Payer: BC Managed Care – PPO | Admitting: Nurse Practitioner

## 2018-12-18 ENCOUNTER — Encounter: Payer: Self-pay | Admitting: Nurse Practitioner

## 2018-12-18 ENCOUNTER — Other Ambulatory Visit: Payer: Self-pay

## 2018-12-18 VITALS — BP 122/79 | HR 80 | Resp 16 | Ht 63.0 in | Wt 138.2 lb

## 2018-12-18 DIAGNOSIS — F5101 Primary insomnia: Secondary | ICD-10-CM | POA: Diagnosis not present

## 2018-12-18 DIAGNOSIS — I10 Essential (primary) hypertension: Secondary | ICD-10-CM | POA: Diagnosis not present

## 2018-12-18 DIAGNOSIS — G43919 Migraine, unspecified, intractable, without status migrainosus: Secondary | ICD-10-CM

## 2018-12-18 DIAGNOSIS — R300 Dysuria: Secondary | ICD-10-CM

## 2018-12-18 DIAGNOSIS — Z0001 Encounter for general adult medical examination with abnormal findings: Secondary | ICD-10-CM | POA: Diagnosis not present

## 2018-12-18 MED ORDER — UBRELVY 50 MG PO TABS: 1.0000 | ORAL_TABLET | Freq: Every day | ORAL | 3 refills | Status: DC | PRN

## 2018-12-18 MED ORDER — ZOLPIDEM TARTRATE 10 MG PO TABS: 10.0000 mg | ORAL_TABLET | Freq: Every evening | ORAL | 3 refills | Status: DC | PRN

## 2018-12-18 MED ORDER — HYDROCHLOROTHIAZIDE 12.5 MG PO TABS: 12.5000 mg | ORAL_TABLET | Freq: Every day | ORAL | 5 refills | Status: DC

## 2018-12-18 NOTE — Progress Notes (Signed)
[facility] [place], [place] [address]  Internal MEDICINE  Office Visit Note  Patient Name: Lisa Osborn  [id number]  [id number]  Date of Service: 12/31/2018   Pt is here for routine health maintenance examination  Chief Complaint  Patient presents with  . Annual Exam  . Allergies  . Hypertension  . Migraine    while working at [employer] pt gets migraines from wearing mask     The patient is here for health maintenance exam. The patient is complaining of increased frequency of migraine headaches. They are particularly bad on weekends when she is working in [employer] and having to wear mask for 9 hours at a time. Headaches are unilateral. She is sensitive to light and sound. Currently using ibuprofen to help, but only effective sometimes.     Current Medication: Outpatient Encounter Medications as of 12/18/2018  Medication Sig  . amLODipine (NORVASC) 10 MG tablet Take 1 tablet (10 mg total) by mouth daily.  Marland Kitchen EPINEPHrine (EPIPEN 2-PAK) 0.3 mg/0.3 mL IJ SOAJ injection Inject into the muscle once.  . hydrochlorothiazide (HYDRODIURIL) 12.5 MG tablet Take 1 tablet (12.5 mg total) by mouth daily.  Marland Kitchen zolpidem (AMBIEN) 10 MG tablet Take 1 tablet (10 mg total) by mouth at bedtime as needed for sleep.  . [DISCONTINUED] hydrochlorothiazide (HYDRODIURIL) 12.5 MG tablet Take 1 tablet (12.5 mg total) by mouth daily.  . [DISCONTINUED] zolpidem (AMBIEN) 10 MG tablet Take 1 tablet (10 mg total) by mouth at bedtime as needed for sleep.  . [DISCONTINUED] zolpidem (AMBIEN) 10 MG tablet Take 1 tablet (10 mg total) by mouth at bedtime as needed for sleep.  Marland Kitchen Ubrogepant (UBRELVY) 50 MG TABS Take 1 tablet by mouth daily as needed.  . [DISCONTINUED] ALPRAZolam (XANAX) 0.25 MG tablet Take 1 tablet (0.25 mg total) by mouth 2 (two) times daily as needed for anxiety. (Patient not taking: Reported on 07/28/2018)  . [DISCONTINUED] azithromycin (ZITHROMAX) 250 MG tablet z-pack - take  as directed for 5 days (Patient not taking: Reported on 12/18/2018)  . [DISCONTINUED] chlorpheniramine-HYDROcodone (TUSSIONEX PENNKINETIC ER) 10-8 MG/5ML SUER Take 5 mLs by mouth every 12 (twelve) hours as needed for cough. (Patient not taking: Reported on 12/18/2018)   No facility-administered encounter medications on file as of 12/18/2018.     Surgical History: Past Surgical History:  Procedure Laterality Date  . ABDOMINAL HYSTERECTOMY      Medical History: Past Medical History:  Diagnosis Date  . Allergy   . Gout   . Hypertension     Family History: Family History  Problem Relation Age of Onset  . Breast cancer Mother   . Hyperlipidemia Mother   . Breast cancer Paternal Aunt   . Breast cancer Maternal Grandmother   . Breast cancer Cousin        pat cousin  . Cancer Father   . Hyperlipidemia Father   . Hypertension Father   . Brain cancer Brother       Review of Systems  Constitutional: Negative for activity change, chills, fatigue and fever.  HENT: Negative for congestion, ear pain, rhinorrhea, sinus pain and sore throat.   Respiratory: Negative for cough, chest tightness, shortness of breath and wheezing.   Cardiovascular: Negative for chest pain and palpitations.  Gastrointestinal: Negative for nausea and vomiting.  Endocrine: Negative for cold intolerance, heat intolerance, polydipsia and polyuria.  Musculoskeletal: Positive for myalgias. Negative for arthralgias.  Skin: Negative for rash.  Allergic/Immunologic: Negative for environmental allergies.  Neurological: Positive  for headaches.       Patient is having increased frequency of migraine headaches.   Hematological: Negative for adenopathy.  Psychiatric/Behavioral: Positive for sleep disturbance. Negative for dysphoric mood.    Today's Vitals   12/18/18 0909  BP: 122/79  Pulse: 80  Resp: 16  SpO2: 100%  Weight: 138 lb 3.2 oz (62.7 kg)  Height: 5\' 3"  (1.6 m)   Body mass index is 24.48  kg/m.  Physical Exam Vitals signs and nursing note reviewed.  Constitutional:      General: She is not in acute distress.    Appearance: Normal appearance. She is well-developed. She is not diaphoretic.  HENT:     Head: Normocephalic and atraumatic.     Mouth/Throat:     Pharynx: No oropharyngeal exudate.  Eyes:     Extraocular Movements: Extraocular movements intact.     Pupils: Pupils are equal, round, and reactive to light.  Neck:     Musculoskeletal: Normal range of motion and neck supple.     Thyroid: No thyromegaly.     Vascular: No carotid bruit or JVD.     Trachea: No tracheal deviation.  Cardiovascular:     Rate and Rhythm: Normal rate and regular rhythm.     Pulses: Normal pulses.     Heart sounds: Normal heart sounds. No murmur. No friction rub. No gallop.   Pulmonary:     Effort: Pulmonary effort is normal. No respiratory distress.     Breath sounds: Normal breath sounds. No wheezing or rales.  Chest:     Chest wall: No tenderness.     Breasts:        Right: Normal. No swelling, bleeding, inverted nipple, mass, nipple discharge, skin change or tenderness.        Left: Normal. No swelling, bleeding, inverted nipple, mass, nipple discharge, skin change or tenderness.  Abdominal:     General: Bowel sounds are normal.     Palpations: Abdomen is soft.     Tenderness: There is no abdominal tenderness.  Lymphadenopathy:     Cervical: No cervical adenopathy.  Skin:    General: Skin is warm and dry.  Neurological:     Mental Status: She is alert and oriented to person, place, and time.     Cranial Nerves: No cranial nerve deficit.  Psychiatric:        Behavior: Behavior normal.        Thought Content: Thought content normal.        Judgment: Judgment normal.   Assessment/Plan: 1. Encounter for general adult medical examination with abnormal findings Annual health maintenance exam today.  2. Essential hypertension Stable. Continue bp medication as prescribed  -  hydrochlorothiazide (HYDRODIURIL) 12.5 MG tablet; Take 1 tablet (12.5 mg total) by mouth daily.  Dispense: 30 tablet; Refill: 5  3. Intractable migraine without status migrainosus, unspecified migraine type New prescription for ubrelvy 50mg  tablets. Take 1 at onset of migraine and repeat as needed and as prescribed . - Ubrogepant (UBRELVY) 50 MG TABS; Take 1 tablet by mouth daily as needed.  Dispense: 10 tablet; Refill: 3  4. Primary insomnia May continue ambien 10mg  at bedtime as needed for insomnia. New prescription sent to her pharmacy.  - zolpidem (AMBIEN) 10 MG tablet; Take 1 tablet (10 mg total) by mouth at bedtime as needed for sleep.  Dispense: 30 tablet; Refill: 3  5. Dysuria - Urinalysis, Routine w reflex microscopic  General Counseling: [name] understanding of the findings of todays  visit and agrees with plan of treatment. I have discussed any further diagnostic evaluation that may be needed or ordered today. We also reviewed her medications today. she has been encouraged to call the office with any questions or concerns that should arise related to todays visit.    Counseling:  Hypertension Counseling:   The following hypertensive lifestyle modification were recommended and discussed:  1. Limiting alcohol intake to less than 1 oz/day of ethanol:(24 oz of beer or 8 oz of wine or 2 oz of 100-proof whiskey). 2. Take baby ASA 81 mg daily. 3. Importance of regular aerobic exercise and losing weight. 4. Reduce dietary saturated fat and cholesterol intake for overall cardiovascular health. 5. Maintaining adequate dietary potassium, calcium, and magnesium intake. 6. Regular monitoring of the blood pressure. 7. Reduce sodium intake to less than 100 mmol/day (less than 2.3 gm of sodium or less than 6 gm of sodium choride)   This patient was seen by [place] with Dr Lavera Guise as a part of collaborative care agreement  Orders Placed This Encounter   Procedures  . Urinalysis, Routine w reflex microscopic    Meds ordered this encounter  Medications  . hydrochlorothiazide (HYDRODIURIL) 12.5 MG tablet    Sig: Take 1 tablet (12.5 mg total) by mouth daily.    Dispense:  30 tablet    Refill:  5    Order Specific Question:   Supervising Provider    Answer:   Lavera Guise [[id number]]  . DISCONTD: zolpidem (AMBIEN) 10 MG tablet    Sig: Take 1 tablet (10 mg total) by mouth at bedtime as needed for sleep.    Dispense:  30 tablet    Refill:  3    Please do not run with insurance. Will have [place] card.    Order Specific Question:   Supervising Provider    Answer:   Lavera Guise [[id number]]  . Ubrogepant (UBRELVY) 50 MG TABS    Sig: Take 1 tablet by mouth daily as needed.    Dispense:  10 tablet    Refill:  3    Patient will be given copay card for this medication.    Order Specific Question:   Supervising Provider    Answer:   Lavera Guise [[id number]]  . zolpidem (AMBIEN) 10 MG tablet    Sig: Take 1 tablet (10 mg total) by mouth at bedtime as needed for sleep.    Dispense:  30 tablet    Refill:  3    Please do not run with insurance. Will have [place] card.    Order Specific Question:   Supervising Provider    Answer:   Lavera Guise [[id number]]    Time spent: [place], MD  Internal Medicine

## 2018-12-19 LAB — URINALYSIS, ROUTINE W REFLEX MICROSCOPIC
Bilirubin, UA: NEGATIVE
Glucose, UA: NEGATIVE
Ketones, UA: NEGATIVE
Leukocytes,UA: NEGATIVE
Nitrite, UA: NEGATIVE
Protein,UA: NEGATIVE
RBC, UA: NEGATIVE
Specific Gravity, UA: 1.025 (ref 1.005–1.030)
Urobilinogen, Ur: 0.2 mg/dL (ref 0.2–1.0)
pH, UA: 5 (ref 5.0–7.5)

## 2018-12-31 DIAGNOSIS — G43919 Migraine, unspecified, intractable, without status migrainosus: Secondary | ICD-10-CM | POA: Insufficient documentation

## 2018-12-31 DIAGNOSIS — R300 Dysuria: Secondary | ICD-10-CM | POA: Insufficient documentation

## 2019-04-18 ENCOUNTER — Telehealth: Payer: Self-pay

## 2019-04-18 NOTE — Telephone Encounter (Signed)
LMOM FOR PATIENT TO CONFIRM AND GO OVER COVID SCREENING QUESTIONS FOR 04-24-19 OV.

## 2019-04-23 ENCOUNTER — Telehealth: Payer: Self-pay

## 2019-04-23 NOTE — Telephone Encounter (Signed)
Confirmed appointment with appointment. klh

## 2019-04-24 ENCOUNTER — Ambulatory Visit (INDEPENDENT_AMBULATORY_CARE_PROVIDER_SITE_OTHER): Payer: BC Managed Care – PPO | Admitting: Nurse Practitioner

## 2019-04-24 ENCOUNTER — Other Ambulatory Visit: Payer: Self-pay | Admitting: Nurse Practitioner

## 2019-04-24 DIAGNOSIS — F5101 Primary insomnia: Secondary | ICD-10-CM

## 2019-04-24 DIAGNOSIS — I10 Essential (primary) hypertension: Secondary | ICD-10-CM | POA: Diagnosis not present

## 2019-04-24 MED ORDER — ZOLPIDEM TARTRATE 10 MG PO TABS: 10.0000 mg | ORAL_TABLET | Freq: Every evening | ORAL | 3 refills | Status: DC | PRN

## 2019-04-24 NOTE — Progress Notes (Signed)
Refilled ambien 10mg  at bedtime at night as needed for insomnia. Sent #30 days with three refills.

## 2019-04-25 ENCOUNTER — Encounter: Payer: Self-pay | Admitting: Nurse Practitioner

## 2019-04-25 NOTE — Progress Notes (Signed)
[facility] [place], [place] [address]  Internal MEDICINE  Telephone Visit  Patient Name: Lisa Osborn  [id number]  [id number]  Date of Service: 04/25/2019  I connected with the patient at 2:45pm by telephone and verified the patients identity using two identifiers.   I discussed the limitations, risks, security and privacy concerns of performing an evaluation and management service by telephone and the availability of in person appointments. I also discussed with the patient that there may be a patient responsible charge related to the service.  The patient expressed understanding and agrees to proceed.    Chief Complaint  Patient presents with  . Telephone Assessment  . Telephone Screen  . Hypertension    The patient has been contacted via telephone for follow up visit due to concerns for spread of novel coronavirus. The patient presents for follow up visit. She needs to have refill of her [place]. Has been without for several days and is not sleeping well. States that her blood pressure is well controlled.  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 Teachers are scheduled to go back to work in the buildings next week and will be having the children back to school in January. She is not sure she is comfortable with this. She does have hypertension and strong family history of coronary artery disease. The school system will allow teachers to work remotely if given a work note from providers stating that they are safer to work from home, as they have increased risk of complications if they were to [place] [address]. She is not sure if she needs this letter. Plans to go back to work as scheduled and will make further decisions based on the situation. Will provide her a letter if she feels remote working is more appropriate for her.       Current Medication: Outpatient Encounter Medications as of 04/24/2019  Medication Sig  . amLODipine (NORVASC) 10  MG tablet Take 1 tablet (10 mg total) by mouth daily.  Marland Kitchen EPINEPHrine (EPIPEN 2-PAK) 0.3 mg/0.3 mL IJ SOAJ injection Inject into the muscle once.  . hydrochlorothiazide (HYDRODIURIL) 12.5 MG tablet Take 1 tablet (12.5 mg total) by mouth daily.  Marland Kitchen Ubrogepant (UBRELVY) 50 MG TABS Take 1 tablet by mouth daily as needed.  . [DISCONTINUED] zolpidem (AMBIEN) 10 MG tablet Take 1 tablet (10 mg total) by mouth at bedtime as needed for sleep.   No facility-administered encounter medications on file as of 04/24/2019.     Surgical History: Past Surgical History:  Procedure Laterality Date  . ABDOMINAL HYSTERECTOMY      Medical History: Past Medical History:  Diagnosis Date  . Allergy   . Gout   . Hypertension     Family History: Family History  Problem Relation Age of Onset  . Breast cancer Mother   . Hyperlipidemia Mother   . Breast cancer Paternal Aunt   . Breast cancer Maternal Grandmother   . Breast cancer Cousin        pat cousin  . Cancer Father   . Hyperlipidemia Father   . Hypertension Father   . Brain cancer Brother     Social History   Socioeconomic History  . Marital status: Married    Spouse name: Not on file  . Number of children: Not on file  . Years of education: Not on file  . Highest education level: Not on file  Occupational History  . Not on file  Social Needs  . Financial resource strain: Not on file  . Food insecurity    Worry: Not on file    Inability: Not on file  . Transportation needs    Medical: Not on file    Non-medical: Not on file  Tobacco Use  . Smoking status: Never Smoker  . Smokeless tobacco: Never Used  Substance and Sexual Activity  . Alcohol use: Yes    Comment: ocassionally  . Drug use: Never  . Sexual activity: Not on file  Lifestyle  . Physical activity    Days per week: Not on file    Minutes per session: Not on file  . Stress: Not on file  Relationships  . Social Herbalist on phone: Not on file    Gets  together: Not on file    Attends religious service: Not on file    Active member of club or organization: Not on file    Attends meetings of clubs or organizations: Not on file    Relationship status: Not on file  . Intimate partner violence    Fear of current or ex partner: Not on file    Emotionally abused: Not on file    Physically abused: Not on file    Forced sexual activity: Not on file  Other Topics Concern  . Not on file  Social History Narrative  . Not on file      Review of Systems  Constitutional: Negative for activity change, chills, fatigue and fever.  HENT: Negative for congestion, ear pain, rhinorrhea, sinus pain and sore throat.   Respiratory: Negative for cough, chest tightness, shortness of breath and wheezing.   Cardiovascular: Negative for chest pain and palpitations.  Gastrointestinal: Negative for nausea and vomiting.  Endocrine: Negative for cold intolerance, heat intolerance, polydipsia and polyuria.  Musculoskeletal: Negative for arthralgias and myalgias.  Skin: Negative for rash.  Allergic/Immunologic: Negative for environmental allergies.  Neurological: Positive for headaches.  Hematological: Negative for adenopathy.  Psychiatric/Behavioral: Positive for sleep disturbance. Negative for dysphoric mood.    Vital Signs: There were no vitals taken for this visit.   Observation/Objective:   The patient is alert and oriented. She is pleasant and answers all questions appropriately. Breathing is non-labored. She is in no acute distress at this time.    Assessment/Plan:  1. Essential hypertension Blood pressure well controlled. Continue medication as prescribed   2. Primary insomnia May take ambien at bedtime as needed. A new prescription was sent to her pharmacy today.   General Counseling: Lisa Osborn understanding of the findings of today's phone visit and agrees with plan of treatment. I have discussed any further diagnostic evaluation  that may be needed or ordered today. We also reviewed her medications today. she has been encouraged to call the office with any questions or concerns that should arise related to todays visit.   This patient was seen by Leretha Pol FNP [facility] with Dr Lavera Guise as a part of collaborative care agreement  Time spent: 25 Minutes    Dr Lavera Guise Internal medicine

## 2019-08-21 ENCOUNTER — Other Ambulatory Visit: Payer: Self-pay | Admitting: Nurse Practitioner

## 2019-08-21 ENCOUNTER — Telehealth: Payer: Self-pay

## 2019-08-21 DIAGNOSIS — F5101 Primary insomnia: Secondary | ICD-10-CM

## 2019-08-21 MED ORDER — ZOLPIDEM TARTRATE 10 MG PO TABS: 10.0000 mg | ORAL_TABLET | Freq: Every evening | ORAL | 3 refills | Status: DC | PRN

## 2019-08-21 NOTE — Progress Notes (Signed)
Renewed ambien and sent to [facility]

## 2019-08-21 NOTE — Telephone Encounter (Signed)
Pt notified and also advised to keep follow up

## 2019-08-21 NOTE — Telephone Encounter (Signed)
Renewed ambien and sent to [facility]

## 2019-08-21 NOTE — Telephone Encounter (Signed)
CONFIRMED AND SCREENED FOR 08-23-19 OV. 

## 2019-08-23 ENCOUNTER — Ambulatory Visit (INDEPENDENT_AMBULATORY_CARE_PROVIDER_SITE_OTHER): Payer: BC Managed Care – PPO | Admitting: Nurse Practitioner

## 2019-08-23 ENCOUNTER — Other Ambulatory Visit: Payer: Self-pay

## 2019-08-23 ENCOUNTER — Encounter: Payer: Self-pay | Admitting: Nurse Practitioner

## 2019-08-23 VITALS — BP 127/77 | HR 76 | Temp 97.3°F | Resp 16 | Ht 63.0 in | Wt 137.6 lb

## 2019-08-23 DIAGNOSIS — G43919 Migraine, unspecified, intractable, without status migrainosus: Secondary | ICD-10-CM | POA: Diagnosis not present

## 2019-08-23 DIAGNOSIS — F5101 Primary insomnia: Secondary | ICD-10-CM | POA: Diagnosis not present

## 2019-08-23 DIAGNOSIS — I10 Essential (primary) hypertension: Secondary | ICD-10-CM | POA: Diagnosis not present

## 2019-08-23 MED ORDER — HYDROCHLOROTHIAZIDE 12.5 MG PO TABS: 12.5000 mg | ORAL_TABLET | Freq: Every day | ORAL | 5 refills | Status: DC

## 2019-08-23 MED ORDER — AMLODIPINE BESYLATE 10 MG PO TABS: 10.0000 mg | ORAL_TABLET | Freq: Every day | ORAL | 5 refills | Status: DC

## 2019-08-23 NOTE — Progress Notes (Signed)
[facility] [place], [place] [address]  Internal MEDICINE  Office Visit Note  Patient Name: Lisa Osborn  [id number]  [id number]  Date of Service: 09/05/2019  Chief Complaint  Patient presents with  . Hypertension  . Allergies    The patient is here for routine follow up. She denies concerns or complaints. Her blood pressure is well controlled. She does need refills for her bp medication today. She takes ambien at night to help with sleep. This was refilled earlier in the week with three additional refills.       Current Medication: Outpatient Encounter Medications as of 08/23/2019  Medication Sig  . EPINEPHrine (EPIPEN 2-PAK) 0.3 mg/0.3 mL IJ SOAJ injection Inject into the muscle once.  Marland Kitchen Ubrogepant (UBRELVY) 50 MG TABS Take 1 tablet by mouth daily as needed.  . zolpidem (AMBIEN) 10 MG tablet Take 1 tablet (10 mg total) by mouth at bedtime as needed for sleep.  . [DISCONTINUED] amLODipine (NORVASC) 10 MG tablet Take 1 tablet (10 mg total) by mouth daily.  . [DISCONTINUED] hydrochlorothiazide (HYDRODIURIL) 12.5 MG tablet Take 1 tablet (12.5 mg total) by mouth daily.  Marland Kitchen amLODipine (NORVASC) 10 MG tablet Take 1 tablet (10 mg total) by mouth daily.  . hydrochlorothiazide (HYDRODIURIL) 12.5 MG tablet Take 1 tablet (12.5 mg total) by mouth daily.   No facility-administered encounter medications on file as of 08/23/2019.    Surgical History: Past Surgical History:  Procedure Laterality Date  . ABDOMINAL HYSTERECTOMY      Medical History: Past Medical History:  Diagnosis Date  . Allergy   . Gout   . Hypertension     Family History: Family History  Problem Relation Age of Onset  . Breast cancer Mother   . Hyperlipidemia Mother   . Breast cancer Paternal Aunt   . Breast cancer Maternal Grandmother   . Breast cancer Cousin        pat cousin  . Cancer Father   . Hyperlipidemia Father   . Hypertension Father   . Brain cancer Brother      Social History   Socioeconomic History  . Marital status: Married    Spouse name: Not on file  . Number of children: Not on file  . Years of education: Not on file  . Highest education level: Not on file  Occupational History  . Not on file  Tobacco Use  . Smoking status: Never Smoker  . Smokeless tobacco: Never Used  Substance and Sexual Activity  . Alcohol use: Yes    Comment: ocassionally  . Drug use: Never  . Sexual activity: Not on file  Other Topics Concern  . Not on file  Social History Narrative  . Not on file   Social Determinants of Health   Financial Resource Strain:   . Difficulty of Paying Living Expenses:   Food Insecurity:   . Worried About Charity fundraiser in the Last Year:   . Arboriculturist in the Last Year:   Transportation Needs:   . Film/video editor (Medical):   Marland Kitchen Lack of Transportation (Non-Medical):   Physical Activity:   . Days of Exercise per Week:   . Minutes of Exercise per Session:   Stress:   . Feeling of Stress :   Social Connections:   . Frequency of Communication with Friends and Family:   . Frequency of Social Gatherings with Friends and Family:   . Attends Religious Services:   .  Active Member of Clubs or Organizations:   . Attends Archivist Meetings:   Marland Kitchen Marital Status:   Intimate Partner Violence:   . Fear of Current or Ex-Partner:   . Emotionally Abused:   Marland Kitchen Physically Abused:   . Sexually Abused:       Review of Systems  Constitutional: Negative for activity change, chills, fatigue and fever.  HENT: Negative for congestion, ear pain, rhinorrhea, sinus pain and sore throat.   Respiratory: Negative for cough, chest tightness, shortness of breath and wheezing.   Cardiovascular: Negative for chest pain and palpitations.  Gastrointestinal: Negative for nausea and vomiting.  Endocrine: Negative for cold intolerance, heat intolerance, polydipsia and polyuria.  Musculoskeletal: Negative for  arthralgias and myalgias.  Skin: Negative for rash.  Allergic/Immunologic: Negative for environmental allergies.  Neurological: Positive for headaches.  Hematological: Negative for adenopathy.  Psychiatric/Behavioral: Positive for sleep disturbance. Negative for dysphoric mood.    Today's Vitals   08/23/19 0933  BP: 127/77  Pulse: 76  Resp: 16  Temp: (!) 97.3 F (36.3 C)  SpO2: 100%  Weight: 137 lb 9.6 oz (62.4 kg)  Height: 5\' 3"  (1.6 m)   Body mass index is 24.37 kg/m.  Physical Exam Vitals and nursing note reviewed.  Constitutional:      General: She is not in acute distress.    Appearance: Normal appearance. She is well-developed. She is not diaphoretic.  HENT:     Head: Normocephalic and atraumatic.     Mouth/Throat:     Pharynx: No oropharyngeal exudate.  Eyes:     Pupils: Pupils are equal, round, and reactive to light.  Neck:     Thyroid: No thyromegaly.     Vascular: No JVD.     Trachea: No tracheal deviation.  Cardiovascular:     Rate and Rhythm: Normal rate and regular rhythm.     Heart sounds: Normal heart sounds. No murmur. No friction rub. No gallop.   Pulmonary:     Effort: Pulmonary effort is normal. No respiratory distress.     Breath sounds: Normal breath sounds. No wheezing or rales.  Chest:     Chest wall: No tenderness.  Abdominal:     Palpations: Abdomen is soft.  Musculoskeletal:        General: Normal range of motion.     Cervical back: Normal range of motion and neck supple.  Lymphadenopathy:     Cervical: No cervical adenopathy.  Skin:    General: Skin is warm and dry.  Neurological:     Mental Status: She is alert and oriented to person, place, and time.     Cranial Nerves: No cranial nerve deficit.  Psychiatric:        Mood and Affect: Mood normal.        Behavior: Behavior normal.        Thought Content: Thought content normal.        Judgment: Judgment normal.    Assessment/Plan: 1. Essential hypertension Stable. Continue  blood pressure medication as prescribed. Refills provided today. - amLODipine (NORVASC) 10 MG tablet; Take 1 tablet (10 mg total) by mouth daily.  Dispense: 30 tablet; Refill: 5 - hydrochlorothiazide (HYDRODIURIL) 12.5 MG tablet; Take 1 tablet (12.5 mg total) by mouth daily.  Dispense: 30 tablet; Refill: 5  2. Intractable migraine without status migrainosus, unspecified migraine type ubrelvy working for acute headaches.   3. Primary insomnia May continue ambien at bedtime as needed.   General Counseling: [name] understanding of  the findings of todays visit and agrees with plan of treatment. I have discussed any further diagnostic evaluation that may be needed or ordered today. We also reviewed her medications today. she has been encouraged to call the office with any questions or concerns that should arise related to todays visit.   This patient was seen by [place] with Dr Lavera Guise as a part of collaborative care agreement  Meds ordered this encounter  Medications  . amLODipine (NORVASC) 10 MG tablet    Sig: Take 1 tablet (10 mg total) by mouth daily.    Dispense:  30 tablet    Refill:  5    Order Specific Question:   Supervising Provider    Answer:   Lavera Guise [id number]  . hydrochlorothiazide (HYDRODIURIL) 12.5 MG tablet    Sig: Take 1 tablet (12.5 mg total) by mouth daily.    Dispense:  30 tablet    Refill:  5    Order Specific Question:   Supervising Provider    Answer:   Lavera Guise [id number]    Total time spent: 20 Minutes  Time spent includes review of chart, medications, test results, and follow up plan with the patient.      Dr Lavera Guise Internal medicine

## 2019-08-24 ENCOUNTER — Ambulatory Visit: Payer: BC Managed Care – PPO | Admitting: Nurse Practitioner

## 2019-09-11 ENCOUNTER — Ambulatory Visit: Payer: BC Managed Care – PPO | Admitting: Nurse Practitioner

## 2019-12-21 ENCOUNTER — Telehealth: Payer: Self-pay

## 2019-12-21 ENCOUNTER — Other Ambulatory Visit: Payer: Self-pay

## 2019-12-21 DIAGNOSIS — F5101 Primary insomnia: Secondary | ICD-10-CM

## 2019-12-21 MED ORDER — ZOLPIDEM TARTRATE 10 MG PO TABS: 10.0000 mg | ORAL_TABLET | Freq: Every evening | ORAL | 3 refills | Status: DC | PRN

## 2019-12-21 NOTE — Telephone Encounter (Signed)
Confirmed and screened for 12-25-19 ov. 

## 2019-12-21 NOTE — Telephone Encounter (Signed)
lmom to call  Back

## 2019-12-25 ENCOUNTER — Ambulatory Visit (INDEPENDENT_AMBULATORY_CARE_PROVIDER_SITE_OTHER): Payer: BC Managed Care – PPO | Admitting: Hospice and Palliative Medicine

## 2019-12-25 ENCOUNTER — Encounter: Payer: Self-pay | Admitting: Nurse Practitioner

## 2019-12-25 ENCOUNTER — Other Ambulatory Visit: Payer: Self-pay

## 2019-12-25 DIAGNOSIS — R300 Dysuria: Secondary | ICD-10-CM | POA: Diagnosis not present

## 2019-12-25 DIAGNOSIS — I10 Essential (primary) hypertension: Secondary | ICD-10-CM | POA: Diagnosis not present

## 2019-12-25 DIAGNOSIS — Z1231 Encounter for screening mammogram for malignant neoplasm of breast: Secondary | ICD-10-CM | POA: Diagnosis not present

## 2019-12-25 DIAGNOSIS — Z0000 Encounter for general adult medical examination without abnormal findings: Secondary | ICD-10-CM | POA: Diagnosis not present

## 2019-12-25 MED ORDER — HYDROCHLOROTHIAZIDE 12.5 MG PO TABS: 12.5000 mg | ORAL_TABLET | Freq: Every day | ORAL | 5 refills | Status: DC

## 2019-12-25 NOTE — Progress Notes (Signed)
[facility] [place], [place] [address]  Internal MEDICINE  Office Visit Note  Patient Name: Lisa Osborn  [id number]  [id number]  Date of Service: 12/29/2019  Chief Complaint  Patient presents with   Annual Exam   Hypertension    HPI Pt is here for routine health maintenance examination. She reports overall things have been well. Denies any new medical diagnoses or taking any medications. BP well managed. Ambien is taken as needed at night to help improve her sleep. Labs will be ordered for annual testing and reviewed at next follow-up visit. Mammogram to be ordered to get patient up to date on PHM.  Current Medication: Outpatient Encounter Medications as of 12/25/2019  Medication Sig   amLODipine (NORVASC) 10 MG tablet Take 1 tablet (10 mg total) by mouth daily.   EPINEPHrine (EPIPEN 2-PAK) 0.3 mg/0.3 mL IJ SOAJ injection Inject into the muscle once.   hydrochlorothiazide (HYDRODIURIL) 12.5 MG tablet Take 1 tablet (12.5 mg total) by mouth daily.   zolpidem (AMBIEN) 10 MG tablet Take 1 tablet (10 mg total) by mouth at bedtime as needed for sleep.   [DISCONTINUED] hydrochlorothiazide (HYDRODIURIL) 12.5 MG tablet Take 1 tablet (12.5 mg total) by mouth daily.   [DISCONTINUED] Ubrogepant (UBRELVY) 50 MG TABS Take 1 tablet by mouth daily as needed.   No facility-administered encounter medications on file as of 12/25/2019.    Surgical History: Past Surgical History:  Procedure Laterality Date   ABDOMINAL HYSTERECTOMY      Medical History: Past Medical History:  Diagnosis Date   Allergy    Gout    Hypertension     Family History: Family History  Problem Relation Age of Onset   Breast cancer Mother    Hyperlipidemia Mother    Breast cancer Paternal Aunt    Breast cancer Maternal Grandmother    Breast cancer Cousin        pat cousin   Cancer Father    Hyperlipidemia Father    Hypertension Father    Brain cancer Brother      Review of Systems  Constitutional: Negative.        For chills, fever, fatigue.  HENT: Negative.        For sinus pain, sinus pressure, sore throat, trouble swallowing.  Eyes: Negative.        For changes in vision or visual disturbances.  Respiratory: Negative.        For chest tightness, cough, shortness of breath, wheezing.  Cardiovascular: Negative.        For chest pain, ankle swelling, palpitations.  Gastrointestinal: Negative.        For abdominal pain, constipation, nausea, vomiting, diarrhea.  Endocrine: Negative.        For polydipsia, polyphagia, polyuria.  Genitourinary: Negative.        For dysuria, flank pain, hematuria, increased frequency, urgency.  Musculoskeletal: Negative.        For arthralgias, myalgias, back pain, neck pain, gait disturbances.  Skin:       For rash, wound.  Allergic/Immunologic: Negative.   Neurological: Negative.        For dizziness, headaches, tremors, weakness.  Hematological: Negative.   Psychiatric/Behavioral: Negative.        For confusion, depression, anxiety, sleep disturbances.     Vital Signs: BP 122/74    Pulse 80    Temp (!) 97.3 F (36.3 C)    Resp 16    Ht 5\' 3"  (1.6 m)  Wt 139 lb 9.6 oz (63.3 kg)    SpO2 99%    BMI 24.73 kg/m    Physical Exam Vitals reviewed.  Constitutional:      Appearance: Normal appearance. She is normal weight.  HENT:     Head: Normocephalic.     Nose: Nose normal.     Mouth/Throat:     Mouth: Mucous membranes are moist.     Pharynx: Oropharynx is clear.  Cardiovascular:     Rate and Rhythm: Normal rate and regular rhythm.     Pulses: Normal pulses.     Heart sounds: Normal heart sounds.  Pulmonary:     Effort: Pulmonary effort is normal.     Breath sounds: Normal breath sounds.  Abdominal:     General: Abdomen is flat. Bowel sounds are normal.     Palpations: Abdomen is soft.  Musculoskeletal:        General: Normal range of motion.     Cervical back: Normal range of motion.   Skin:    General: Skin is warm and dry.  Neurological:     General: No focal deficit present.     Mental Status: She is alert and oriented to person, place, and time. Mental status is at baseline.  Psychiatric:        Mood and Affect: Mood normal.        Behavior: Behavior normal.        Thought Content: Thought content normal.    LABS: Recent Results (from the past 2160 hour(s))  UA/M w/rflx Culture, Routine     Status: None   Collection Time: 12/25/19 12:00 AM   Specimen: Urine   Urine  Result Value Ref Range   Specific Gravity, UA 1.021 1.005 - 1.030   pH, UA 7.5 5.0 - 7.5   Color, UA Yellow Yellow   Appearance Ur Clear Clear   Leukocytes,UA Negative Negative   Protein,UA Negative Negative/Trace   Glucose, UA Negative Negative   Ketones, UA Negative Negative   RBC, UA Negative Negative   Bilirubin, UA Negative Negative   Urobilinogen, Ur 1.0 0.2 - 1.0 mg/dL   Nitrite, UA Negative Negative   Microscopic Examination Comment     Comment: Microscopic follows if indicated.   Microscopic Examination See below:     Comment: Microscopic was indicated and was performed.   Urinalysis Reflex Comment     Comment: This specimen will not reflex to a Urine Culture.  Microscopic Examination     Status: None   Collection Time: 12/25/19 12:00 AM   Urine  Result Value Ref Range   WBC, UA None seen 0 - 5 /hpf   RBC None seen 0 - 2 /hpf   Epithelial Cells (non renal) None seen 0 - 10 /hpf   Casts None seen None seen /lpf   Bacteria, UA None seen None seen/Few    Assessment/Plan: 1. Encounter for health maintenance examination Well appearing 54 year old female, up to date on PHM. - CBC with Differential/Platelet - Lipid Panel With LDL/HDL Ratio - TSH - T4, free - Comprehensive metabolic panel - Fe+TIBC+Fer - Vitamin D 1,25 dihydroxy; Future - B12  2. Essential hypertension Stable at this time, continue with current therapy. - hydrochlorothiazide (HYDRODIURIL) 12.5 MG  tablet; Take 1 tablet (12.5 mg total) by mouth daily.  Dispense: 30 tablet; Refill: 5  3. Dysuria - UA/M w/rflx Culture, Routine - Microscopic Examination  4. Encounter for screening mammogram for breast cancer - MM DIGITAL SCREENING  BILATERAL; Future  General Counseling: disha cottam understanding of the findings of todays visit and agrees with plan of treatment. I have discussed any further diagnostic evaluation that may be needed or ordered today. We also reviewed her medications today. she has been encouraged to call the office with any questions or concerns that should arise related to todays visit.  Orders Placed This Encounter  Procedures   Microscopic Examination   MM DIGITAL SCREENING BILATERAL   UA/M w/rflx Culture, Routine   CBC with Differential/Platelet   Lipid Panel With LDL/HDL Ratio   TSH   T4, free   Comprehensive metabolic panel   Fe+TIBC+Fer   Vitamin D 1,25 dihydroxy   B12    Meds ordered this encounter  Medications   hydrochlorothiazide (HYDRODIURIL) 12.5 MG tablet    Sig: Take 1 tablet (12.5 mg total) by mouth daily.    Dispense:  30 tablet    Refill:  5    Total time spent: 30 Minutes  This patient was seen by Theodoro Grist, AGNP-C in collaboration with Dr. Lavera Guise as part of a collaborative care agreement.  Time spent includes review of chart, medications, test results, and follow up plan with the patient.   Tanna Furry Kenton Kingfisher, AGNP-C  Lavera Guise, MD Internal Medicine

## 2019-12-26 LAB — UA/M W/RFLX CULTURE, ROUTINE
Bilirubin, UA: NEGATIVE
Glucose, UA: NEGATIVE
Ketones, UA: NEGATIVE
Leukocytes,UA: NEGATIVE
Nitrite, UA: NEGATIVE
Protein,UA: NEGATIVE
RBC, UA: NEGATIVE
Specific Gravity, UA: 1.021 (ref 1.005–1.030)
Urobilinogen, Ur: 1 mg/dL (ref 0.2–1.0)
pH, UA: 7.5 (ref 5.0–7.5)

## 2019-12-26 LAB — MICROSCOPIC EXAMINATION
Bacteria, UA: NONE SEEN
Casts: NONE SEEN /lpf
Epithelial Cells (non renal): NONE SEEN /hpf (ref 0–10)
RBC, Urine: NONE SEEN /hpf (ref 0–2)
WBC, UA: NONE SEEN /hpf (ref 0–5)

## 2020-02-08 ENCOUNTER — Telehealth: Payer: Self-pay

## 2020-02-08 NOTE — Telephone Encounter (Signed)
LMOM for OV on 9/21 

## 2020-02-12 ENCOUNTER — Encounter: Payer: Self-pay | Admitting: Hospice and Palliative Medicine

## 2020-02-12 ENCOUNTER — Ambulatory Visit: Payer: BC Managed Care – PPO | Admitting: Hospice and Palliative Medicine

## 2020-02-12 ENCOUNTER — Other Ambulatory Visit: Payer: Self-pay

## 2020-02-12 DIAGNOSIS — Z0000 Encounter for general adult medical examination without abnormal findings: Secondary | ICD-10-CM

## 2020-02-12 DIAGNOSIS — F5101 Primary insomnia: Secondary | ICD-10-CM | POA: Diagnosis not present

## 2020-02-12 DIAGNOSIS — I10 Essential (primary) hypertension: Secondary | ICD-10-CM | POA: Diagnosis not present

## 2020-02-12 DIAGNOSIS — Z780 Asymptomatic menopausal state: Secondary | ICD-10-CM

## 2020-02-12 DIAGNOSIS — Z23 Encounter for immunization: Secondary | ICD-10-CM

## 2020-02-12 DIAGNOSIS — Z1231 Encounter for screening mammogram for malignant neoplasm of breast: Secondary | ICD-10-CM

## 2020-02-12 MED ORDER — HYDROCHLOROTHIAZIDE 12.5 MG PO TABS: 12.5000 mg | ORAL_TABLET | Freq: Every day | ORAL | 2 refills | Status: DC

## 2020-02-12 MED ORDER — ZOLPIDEM TARTRATE 10 MG PO TABS: 10.0000 mg | ORAL_TABLET | Freq: Every evening | ORAL | 3 refills | Status: DC | PRN

## 2020-02-12 NOTE — Progress Notes (Signed)
[facility] [place], [place] [address]  Internal MEDICINE  Office Visit Note  Patient Name: Lisa Osborn  [id number]  [id number]  Date of Service: 02/12/2020  Chief Complaint  Patient presents with  . Follow-up  . Hypertension  . Medication Refill  . Quality Metric Gaps    hep C,covid    HPI Patient is here for routine follow-up  She has not had a chance to have her lab work completed yet, she has also called to set up her mammogram but was placed on hold and she did not wait, she has plans to have labs drawn and mammogram scheduled by the end of this week She is requesting refills of her Ambien today-she has been taking Ambien to help with her sleep for several years. She struggles with falling asleep due to getting her mind to relax and stop thinking about things. She does not take Ambien every night but in a weeks time she usually takes 3-4 nights. She goes to bed around 10 pm, wakes up every morning at 4 am to go to the bathroom, then usually falls back asleep and gets up around 8 am. She has never been told that she snores, she does not wake up with headaches and she says she does feel rested during the day.  She has never had a bone density scan, she is also not sure is she is has truly gone through menopause. She had a partial hysterectomy about 10 years ago, she says she has never had symptoms of menopause.  Current Medication: Outpatient Encounter Medications as of 02/12/2020  Medication Sig  . amLODipine (NORVASC) 10 MG tablet Take 1 tablet (10 mg total) by mouth daily.  Marland Kitchen EPINEPHrine (EPIPEN 2-PAK) 0.3 mg/0.3 mL IJ SOAJ injection Inject into the muscle once.  . hydrochlorothiazide (HYDRODIURIL) 12.5 MG tablet Take 1 tablet (12.5 mg total) by mouth daily.  Marland Kitchen zolpidem (AMBIEN) 10 MG tablet Take 1 tablet (10 mg total) by mouth at bedtime as needed for sleep.  . [DISCONTINUED] hydrochlorothiazide (HYDRODIURIL) 12.5 MG tablet Take 1 tablet (12.5 mg  total) by mouth daily.  . [DISCONTINUED] zolpidem (AMBIEN) 10 MG tablet Take 1 tablet (10 mg total) by mouth at bedtime as needed for sleep.   No facility-administered encounter medications on file as of 02/12/2020.    Surgical History: Past Surgical History:  Procedure Laterality Date  . ABDOMINAL HYSTERECTOMY      Medical History: Past Medical History:  Diagnosis Date  . Allergy   . Gout   . Hypertension     Family History: Family History  Problem Relation Age of Onset  . Breast cancer Mother   . Hyperlipidemia Mother   . Breast cancer Paternal Aunt   . Breast cancer Maternal Grandmother   . Breast cancer Cousin        pat cousin  . Cancer Father   . Hyperlipidemia Father   . Hypertension Father   . Brain cancer Brother     Social History   Socioeconomic History  . Marital status: Married    Spouse name: Not on file  . Number of children: Not on file  . Years of education: Not on file  . Highest education level: Not on file  Occupational History  . Not on file  Tobacco Use  . Smoking status: Never Smoker  . Smokeless tobacco: Never Used  Vaping Use  . Vaping Use: Never used  Substance and Sexual Activity  . Alcohol use:  Yes    Comment: ocassionally  . Drug use: Never  . Sexual activity: Not on file  Other Topics Concern  . Not on file  Social History Narrative  . Not on file   Social Determinants of Health   Financial Resource Strain:   . Difficulty of Paying Living Expenses: Not on file  Food Insecurity:   . Worried About Charity fundraiser in the Last Year: Not on file  . Ran Out of Food in the Last Year: Not on file  Transportation Needs:   . Lack of Transportation (Medical): Not on file  . Lack of Transportation (Non-Medical): Not on file  Physical Activity:   . Days of Exercise per Week: Not on file  . Minutes of Exercise per Session: Not on file  Stress:   . Feeling of Stress : Not on file  Social Connections:   . Frequency of  Communication with Friends and Family: Not on file  . Frequency of Social Gatherings with Friends and Family: Not on file  . Attends Religious Services: Not on file  . Active Member of Clubs or Organizations: Not on file  . Attends Archivist Meetings: Not on file  . Marital Status: Not on file  Intimate Partner Violence:   . Fear of Current or Ex-Partner: Not on file  . Emotionally Abused: Not on file  . Physically Abused: Not on file  . Sexually Abused: Not on file      Review of Systems  Constitutional: Negative for chills, diaphoresis and fatigue.  HENT: Negative for ear pain, postnasal drip and sinus pressure.   Eyes: Negative for photophobia, discharge, redness, itching and visual disturbance.  Respiratory: Negative for cough, shortness of breath and wheezing.   Cardiovascular: Negative for chest pain, palpitations and leg swelling.  Gastrointestinal: Negative for abdominal pain, constipation, diarrhea, nausea and vomiting.  Genitourinary: Negative for dysuria and flank pain.  Musculoskeletal: Negative for arthralgias, back pain, gait problem and neck pain.  Skin: Negative for color change.  Allergic/Immunologic: Negative for environmental allergies and food allergies.  Neurological: Negative for dizziness and headaches.  Hematological: Does not bruise/bleed easily.  Psychiatric/Behavioral: Negative for agitation, behavioral problems (depression) and hallucinations.    Vital Signs: BP 118/77   Pulse 78   Temp (!) 97.4 F (36.3 C)   Resp 16   Ht 5\' 3"  (1.6 m)   Wt 140 lb 9.6 oz (63.8 kg)   SpO2 99%   BMI 24.91 kg/m    Physical Exam Vitals reviewed.  Constitutional:      Appearance: Normal appearance. She is normal weight.  HENT:     Nose: Nose normal.     Mouth/Throat:     Mouth: Mucous membranes are moist.     Pharynx: Oropharynx is clear.  Cardiovascular:     Rate and Rhythm: Normal rate and regular rhythm.     Pulses: Normal pulses.     Heart  sounds: Normal heart sounds.  Pulmonary:     Effort: Pulmonary effort is normal.     Breath sounds: Normal breath sounds.  Abdominal:     General: Abdomen is flat.     Palpations: Abdomen is soft.  Musculoskeletal:        General: Normal range of motion.     Cervical back: Normal range of motion.  Skin:    General: Skin is warm.  Neurological:     General: No focal deficit present.     Mental Status: She is  alert and oriented to person, place, and time. Mental status is at baseline.  Psychiatric:        Mood and Affect: Mood normal.        Behavior: Behavior normal.        Thought Content: Thought content normal.    Assessment/Plan: 1. Primary insomnia Requesting refills of Ambien today. Encouraged her to take only as needed and to continue only taking medication a few times per week. At this time, based on her symptoms she does not need OSA testing. May need to consider overnight pulse oximetry at next visit. - zolpidem (AMBIEN) 10 MG tablet; Take 1 tablet (10 mg total) by mouth at bedtime as needed for sleep.  Dispense: 30 tablet; Refill: 3  2. Menopause Will assess hormone levels as she is wanting to know if she has been through menopause. Will also assess levels and determine if patient needs bone density scan for baseline fracture risk screening. - FSH/LH  3. Essential hypertension Requesting refills of HCTZ today. BP and HR stable, will continue with current therapy and continue with routine monitoring. - hydrochlorothiazide (HYDRODIURIL) 12.5 MG tablet; Take 1 tablet (12.5 mg total) by mouth daily.  Dispense: 90 tablet; Refill: 2  4. Needs flu shot - Flu Vaccine MDCK QUAD PF  General Counseling: Myrene verbalizes understanding of the findings of todays visit and agrees with plan of treatment. I have discussed any further diagnostic evaluation that may be needed or ordered today. We also reviewed her medications today. she has been encouraged to call the office with any  questions or concerns that should arise related to todays visit.    Orders Placed This Encounter  Procedures  . Flu Vaccine MDCK QUAD PF  . FSH/LH    Meds ordered this encounter  Medications  . hydrochlorothiazide (HYDRODIURIL) 12.5 MG tablet    Sig: Take 1 tablet (12.5 mg total) by mouth daily.    Dispense:  90 tablet    Refill:  2  . zolpidem (AMBIEN) 10 MG tablet    Sig: Take 1 tablet (10 mg total) by mouth at bedtime as needed for sleep.    Dispense:  30 tablet    Refill:  3    Please do not run with insurance. Will have [facility] card.    Time spent: 30 Minutes   This patient was seen by Theodoro Grist AGNP-C in Collaboration with Dr Lavera Guise as a part of collaborative care agreement     Tanna Furry. Lyndal Reggio AGNP-C Internal medicine

## 2020-03-17 ENCOUNTER — Other Ambulatory Visit: Payer: Self-pay

## 2020-03-17 DIAGNOSIS — I10 Essential (primary) hypertension: Secondary | ICD-10-CM

## 2020-03-17 MED ORDER — AMLODIPINE BESYLATE 10 MG PO TABS: 10.0000 mg | ORAL_TABLET | Freq: Every day | ORAL | 5 refills | Status: DC

## 2020-06-10 ENCOUNTER — Ambulatory Visit: Payer: BC Managed Care – PPO | Admitting: Hospice and Palliative Medicine

## 2020-08-18 ENCOUNTER — Other Ambulatory Visit: Payer: Self-pay

## 2020-08-18 ENCOUNTER — Encounter: Payer: Self-pay | Admitting: Hospice and Palliative Medicine

## 2020-08-18 ENCOUNTER — Ambulatory Visit (INDEPENDENT_AMBULATORY_CARE_PROVIDER_SITE_OTHER): Payer: BC Managed Care – PPO | Admitting: Hospice and Palliative Medicine

## 2020-08-18 VITALS — BP 118/72 | HR 76 | Temp 97.5°F | Resp 16 | Ht 63.0 in | Wt 141.2 lb

## 2020-08-18 DIAGNOSIS — R5383 Other fatigue: Secondary | ICD-10-CM | POA: Diagnosis not present

## 2020-08-18 DIAGNOSIS — F5101 Primary insomnia: Secondary | ICD-10-CM | POA: Diagnosis not present

## 2020-08-18 DIAGNOSIS — I10 Essential (primary) hypertension: Secondary | ICD-10-CM | POA: Diagnosis not present

## 2020-08-18 DIAGNOSIS — Z1231 Encounter for screening mammogram for malignant neoplasm of breast: Secondary | ICD-10-CM | POA: Diagnosis not present

## 2020-08-18 MED ORDER — ZOLPIDEM TARTRATE 10 MG PO TABS: 10.0000 mg | ORAL_TABLET | Freq: Every evening | ORAL | 3 refills | Status: DC | PRN

## 2020-08-18 NOTE — Progress Notes (Signed)
[facility] [place], [place] [address]  Internal MEDICINE  Office Visit Note  Patient Name: Lisa Osborn  [id number]  [id number]  Date of Service: 08/19/2020  Chief Complaint  Patient presents with  . Follow-up    Refill request   . Hypertension  . Quality Metric Gaps    Mammogram     HPI Patient is here for routine follow-up Again, reminded that she needs to have her labs drawn--ordered initially several months ago Also reminded that she needs to call and schedule screening mammogram  Requesting refills of Ambien--takes about 3-4 nights per week for insomnia Has had a sleep study in the past which was normal--since she has not had significant weight gain or increased issues with insomnia Last refill in September has lasted her up until this point  BP remains well controlled  No acute issues or complaints to discuss today  Current Medication: Outpatient Encounter Medications as of 08/18/2020  Medication Sig  . amLODipine (NORVASC) 10 MG tablet Take 1 tablet (10 mg total) by mouth daily.  Marland Kitchen EPINEPHrine 0.3 mg/0.3 mL IJ SOAJ injection Inject into the muscle once.  . hydrochlorothiazide (HYDRODIURIL) 12.5 MG tablet Take 1 tablet (12.5 mg total) by mouth daily.  . [DISCONTINUED] zolpidem (AMBIEN) 10 MG tablet Take 1 tablet (10 mg total) by mouth at bedtime as needed for sleep.  Marland Kitchen zolpidem (AMBIEN) 10 MG tablet Take 1 tablet (10 mg total) by mouth at bedtime as needed for sleep.   No facility-administered encounter medications on file as of 08/18/2020.    Surgical History: Past Surgical History:  Procedure Laterality Date  . ABDOMINAL HYSTERECTOMY      Medical History: Past Medical History:  Diagnosis Date  . Allergy   . Gout   . Hypertension     Family History: Family History  Problem Relation Age of Onset  . Breast cancer Mother   . Hyperlipidemia Mother   . Breast cancer Paternal Aunt   . Breast cancer Maternal Grandmother   .  Breast cancer Cousin        pat cousin  . Cancer Father   . Hyperlipidemia Father   . Hypertension Father   . Brain cancer Brother     Social History   Socioeconomic History  . Marital status: Married    Spouse name: Not on file  . Number of children: Not on file  . Years of education: Not on file  . Highest education level: Not on file  Occupational History  . Not on file  Tobacco Use  . Smoking status: Never Smoker  . Smokeless tobacco: Never Used  Vaping Use  . Vaping Use: Never used  Substance and Sexual Activity  . Alcohol use: Yes    Comment: ocassionally  . Drug use: Never  . Sexual activity: Not on file  Other Topics Concern  . Not on file  Social History Narrative  . Not on file   Social Determinants of Health   Financial Resource Strain: Not on file  Food Insecurity: Not on file  Transportation Needs: Not on file  Physical Activity: Not on file  Stress: Not on file  Social Connections: Not on file  Intimate Partner Violence: Not on file      Review of Systems  Constitutional: Negative for chills, diaphoresis and fatigue.  HENT: Negative for ear pain, postnasal drip and sinus pressure.   Eyes: Negative for photophobia, discharge, redness, itching and visual disturbance.  Respiratory: Negative for cough,  shortness of breath and wheezing.   Cardiovascular: Negative for chest pain, palpitations and leg swelling.  Gastrointestinal: Negative for abdominal pain, constipation, diarrhea, nausea and vomiting.  Genitourinary: Negative for dysuria and flank pain.  Musculoskeletal: Negative for arthralgias, back pain, gait problem and neck pain.  Skin: Negative for color change.  Allergic/Immunologic: Negative for environmental allergies and food allergies.  Neurological: Negative for dizziness and headaches.  Hematological: Does not bruise/bleed easily.  Psychiatric/Behavioral: Negative for agitation, behavioral problems (depression) and hallucinations.     Vital Signs: BP 118/72   Pulse 76   Temp (!) 97.5 F (36.4 C)   Resp 16   Ht 5\' 3"  (1.6 m)   Wt 141 lb 3.2 oz (64 kg)   SpO2 99%   BMI 25.01 kg/m    Physical Exam Vitals reviewed.  Constitutional:      Appearance: Normal appearance. She is normal weight.  Cardiovascular:     Rate and Rhythm: Normal rate and regular rhythm.     Pulses: Normal pulses.     Heart sounds: Normal heart sounds.  Pulmonary:     Effort: Pulmonary effort is normal.     Breath sounds: Normal breath sounds.  Abdominal:     General: Abdomen is flat.     Palpations: Abdomen is soft.  Musculoskeletal:        General: Normal range of motion.     Cervical back: Normal range of motion.  Skin:    General: Skin is warm.  Neurological:     General: No focal deficit present.     Mental Status: She is alert and oriented to person, place, and time. Mental status is at baseline.  Psychiatric:        Mood and Affect: Mood normal.        Behavior: Behavior normal.        Thought Content: Thought content normal.        Judgment: Judgment normal.    Assessment/Plan: 1. Essential hypertension BP and HR remain well controlled, continue to monitor  2. Encounter for screening mammogram for malignant neoplasm of breast New order placed and reminded to call and schedule appointment - MM Digital Screening; Future  3. Primary insomnia May continue with Ambien as needed for insomnia, if use of medication increases will consider alternative therapies [place] Controlled Substance Database was reviewed by me for overdose risk score (ORS) - zolpidem (AMBIEN) 10 MG tablet; Take 1 tablet (10 mg total) by mouth at bedtime as needed for sleep.  Dispense: 30 tablet; Refill: 3  4. Other fatigue Labs reordered and print out order requisition given for a reminder to have labs completed - CBC w/Diff/Platelet - Comprehensive Metabolic Panel (CMET) - Lipid Panel With LDL/HDL Ratio - TSH + free T4 - FSH/LH - Vitamin D (25  hydroxy) - B12  General Counseling: Dustina verbalizes understanding of the findings of todays visit and agrees with plan of treatment. I have discussed any further diagnostic evaluation that may be needed or ordered today. We also reviewed her medications today. she has been encouraged to call the office with any questions or concerns that should arise related to todays visit.    Orders Placed This Encounter  Procedures  . MM Digital Screening  . CBC w/Diff/Platelet  . Comprehensive Metabolic Panel (CMET)  . Lipid Panel With LDL/HDL Ratio  . TSH + free T4  . FSH/LH  . Vitamin D (25 hydroxy)  . B12    Meds ordered this encounter  Medications  .  zolpidem (AMBIEN) 10 MG tablet    Sig: Take 1 tablet (10 mg total) by mouth at bedtime as needed for sleep.    Dispense:  30 tablet    Refill:  3    Please do not run with insurance. Will have [facility] card.    Time spent: 30 Minutes Time spent includes review of chart, medications, test results and follow-up plan with the patient.  This patient was seen by Theodoro Grist AGNP-C in Collaboration with Dr Lavera Guise as a part of collaborative care agreement     Tanna Furry. Costantino Kohlbeck AGNP-C Internal medicine

## 2020-08-19 ENCOUNTER — Encounter: Payer: Self-pay | Admitting: Hospice and Palliative Medicine

## 2020-10-08 ENCOUNTER — Other Ambulatory Visit: Payer: Self-pay | Admitting: Nurse Practitioner

## 2020-10-08 DIAGNOSIS — I10 Essential (primary) hypertension: Secondary | ICD-10-CM

## 2020-11-27 ENCOUNTER — Other Ambulatory Visit: Payer: Self-pay

## 2020-11-27 ENCOUNTER — Telehealth: Payer: Self-pay

## 2020-11-27 ENCOUNTER — Other Ambulatory Visit: Payer: Self-pay | Admitting: Nurse Practitioner

## 2020-11-27 DIAGNOSIS — I10 Essential (primary) hypertension: Secondary | ICD-10-CM

## 2020-11-27 MED ORDER — AMLODIPINE BESYLATE 10 MG PO TABS: 10.0000 mg | ORAL_TABLET | Freq: Every day | ORAL | 0 refills | Status: DC

## 2020-11-27 NOTE — Telephone Encounter (Signed)
Spoke pt that she can call us back for ambien when 7 days left and we will send until her appt and also amlodipine to her phar

## 2020-11-27 NOTE — Telephone Encounter (Signed)
Patient called about getting refills for her medications. I sent message to [place]

## 2020-12-11 ENCOUNTER — Telehealth: Payer: Self-pay

## 2020-12-11 ENCOUNTER — Other Ambulatory Visit: Payer: Self-pay | Admitting: Internal Medicine

## 2020-12-11 DIAGNOSIS — F5101 Primary insomnia: Secondary | ICD-10-CM

## 2020-12-11 MED ORDER — ZOLPIDEM TARTRATE 10 MG PO TABS: 10.0000 mg | ORAL_TABLET | Freq: Every evening | ORAL | 0 refills | Status: DC | PRN

## 2020-12-11 NOTE — Telephone Encounter (Signed)
Pt informed that her Lorrin Mais was sent to pharmacy

## 2020-12-26 ENCOUNTER — Encounter: Payer: Self-pay | Admitting: Nurse Practitioner

## 2020-12-31 ENCOUNTER — Other Ambulatory Visit: Payer: Self-pay

## 2020-12-31 ENCOUNTER — Encounter: Payer: Self-pay | Admitting: Nurse Practitioner

## 2020-12-31 ENCOUNTER — Ambulatory Visit (INDEPENDENT_AMBULATORY_CARE_PROVIDER_SITE_OTHER): Payer: BC Managed Care – PPO | Admitting: Nurse Practitioner

## 2020-12-31 VITALS — BP 130/78 | HR 85 | Temp 98.1°F | Resp 16 | Ht 63.0 in | Wt 145.8 lb

## 2020-12-31 DIAGNOSIS — F5101 Primary insomnia: Secondary | ICD-10-CM

## 2020-12-31 DIAGNOSIS — G43919 Migraine, unspecified, intractable, without status migrainosus: Secondary | ICD-10-CM | POA: Diagnosis not present

## 2020-12-31 DIAGNOSIS — F411 Generalized anxiety disorder: Secondary | ICD-10-CM

## 2020-12-31 DIAGNOSIS — Z1231 Encounter for screening mammogram for malignant neoplasm of breast: Secondary | ICD-10-CM

## 2020-12-31 DIAGNOSIS — E559 Vitamin D deficiency, unspecified: Secondary | ICD-10-CM

## 2020-12-31 DIAGNOSIS — Z8639 Personal history of other endocrine, nutritional and metabolic disease: Secondary | ICD-10-CM

## 2020-12-31 DIAGNOSIS — R5383 Other fatigue: Secondary | ICD-10-CM

## 2020-12-31 DIAGNOSIS — I10 Essential (primary) hypertension: Secondary | ICD-10-CM

## 2020-12-31 DIAGNOSIS — Z0001 Encounter for general adult medical examination with abnormal findings: Secondary | ICD-10-CM | POA: Diagnosis not present

## 2020-12-31 DIAGNOSIS — Z780 Asymptomatic menopausal state: Secondary | ICD-10-CM

## 2020-12-31 DIAGNOSIS — R300 Dysuria: Secondary | ICD-10-CM

## 2020-12-31 MED ORDER — AMLODIPINE BESYLATE 10 MG PO TABS: 10.0000 mg | ORAL_TABLET | Freq: Every day | ORAL | 1 refills | Status: DC

## 2020-12-31 MED ORDER — VENLAFAXINE HCL ER 37.5 MG PO CP24: 37.5000 mg | ORAL_CAPSULE | Freq: Every day | ORAL | 1 refills | Status: DC

## 2020-12-31 MED ORDER — HYDROCHLOROTHIAZIDE 12.5 MG PO TABS: 12.5000 mg | ORAL_TABLET | Freq: Every day | ORAL | 2 refills | Status: DC

## 2020-12-31 MED ORDER — ZOLPIDEM TARTRATE 10 MG PO TABS: 10.0000 mg | ORAL_TABLET | Freq: Every evening | ORAL | 2 refills | Status: DC | PRN

## 2020-12-31 NOTE — Progress Notes (Signed)
[facility] [place], [place] [address]  Internal MEDICINE  Office Visit Note  Patient Name: Lisa Osborn  [id number]  [id number]  Date of Service: 12/31/2020  Chief Complaint  Patient presents with   Annual Exam    Med refill, labs need to be ordered   Hypertension    HPI Kristyna presents for an annual well visit and physical exam. she has a history of allergies, gout and hypertension. She has declined the COVID vaccine. Her screening colonoscopy is not due until 2026. Her mammogram was ordered in March this year but has not been scheduled yet, reminded patient to call [facility] to schedule the mammogram. 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 She lives at home with family. She denies any pain today and does not have any other questions or concerns when asked.  She is due for routine labs. She has declined the shingles and pneumonia vaccines.  Her blood pressure is well controlled.    Current Medication: Outpatient Encounter Medications as of 12/31/2020  Medication Sig   EPINEPHrine 0.3 mg/0.3 mL IJ SOAJ injection Inject into the muscle once.   venlafaxine XR (EFFEXOR XR) 37.5 MG 24 hr capsule Take 1 capsule (37.5 mg total) by mouth daily with breakfast.   [DISCONTINUED] amLODipine (NORVASC) 10 MG tablet Take 1 tablet (10 mg total) by mouth daily.   [DISCONTINUED] hydrochlorothiazide (HYDRODIURIL) 12.5 MG tablet Take 1 tablet (12.5 mg total) by mouth daily.   [DISCONTINUED] zolpidem (AMBIEN) 10 MG tablet Take 1 tablet (10 mg total) by mouth at bedtime as needed for sleep.   amLODipine (NORVASC) 10 MG tablet Take 1 tablet (10 mg total) by mouth daily.   hydrochlorothiazide (HYDRODIURIL) 12.5 MG tablet Take 1 tablet (12.5 mg total) by mouth daily.   zolpidem (AMBIEN) 10 MG tablet Take 1 tablet (10 mg total) by mouth at bedtime as needed for sleep.   No facility-administered encounter medications on file as of 12/31/2020.    Surgical History: Past  Surgical History:  Procedure Laterality Date   ABDOMINAL HYSTERECTOMY      Medical History: Past Medical History:  Diagnosis Date   Allergy    Gout    Hypertension     Family History: Family History  Problem Relation Age of Onset   Breast cancer Mother    Hyperlipidemia Mother    Breast cancer Paternal Aunt    Breast cancer Maternal Grandmother    Breast cancer Cousin        pat cousin   Cancer Father    Hyperlipidemia Father    Hypertension Father    Brain cancer Brother     Social History   Socioeconomic History   Marital status: Married    Spouse name: Not on file   Number of children: Not on file   Years of education: Not on file   Highest education level: Not on file  Occupational History   Not on file  Tobacco Use   Smoking status: Never   Smokeless tobacco: Never  Vaping Use   Vaping Use: Never used  Substance and Sexual Activity   Alcohol use: Yes    Comment: ocassionally   Drug use: Never   Sexual activity: Not on file  Other Topics Concern   Not on file  Social History Narrative   Not on file   Social Determinants of Health   Financial Resource Strain: Not on file  Food Insecurity: Not on file  Transportation Needs: Not on file  Physical Activity: Not on file  Stress: Not on file  Social Connections: Not on file  Intimate Partner Violence: Not on file      Review of Systems  Constitutional:  Negative for activity change, appetite change, chills, fatigue, fever and unexpected weight change.  HENT: Negative.  Negative for congestion, ear pain, rhinorrhea, sore throat and trouble swallowing.   Eyes: Negative.   Respiratory: Negative.  Negative for cough, chest tightness, shortness of breath and wheezing.   Cardiovascular: Negative.  Negative for chest pain.  Gastrointestinal: Negative.  Negative for abdominal pain, blood in stool, constipation, diarrhea, nausea and vomiting.  Endocrine: Negative.   Genitourinary: Negative.  Negative for  difficulty urinating, dysuria, frequency, hematuria and urgency.  Musculoskeletal: Negative.  Negative for arthralgias, back pain, joint swelling, myalgias and neck pain.  Skin: Negative.  Negative for rash and wound.  Allergic/Immunologic: Negative.  Negative for immunocompromised state.  Neurological: Negative.  Negative for dizziness, seizures, numbness and headaches.  Hematological: Negative.   Psychiatric/Behavioral: Negative.  Negative for behavioral problems, self-injury and suicidal ideas. The patient is not nervous/anxious.    Vital Signs: BP 130/78   Pulse 85   Temp 98.1 F (36.7 C)   Resp 16   Ht 5' 3"  (1.6 m)   Wt 145 lb 12.8 oz (66.1 kg)   SpO2 99%   BMI 25.83 kg/m    Physical Exam Vitals reviewed.  Constitutional:      General: She is awake. She is not in acute distress.    Appearance: Normal appearance. She is well-developed and normal weight. She is not ill-appearing or diaphoretic.  HENT:     Head: Normocephalic and atraumatic.     Right Ear: Hearing, tympanic membrane, ear canal and external ear normal.     Left Ear: Hearing, tympanic membrane, ear canal and external ear normal.     Nose: Nose normal. No congestion or rhinorrhea.     Mouth/Throat:     Lips: Pink.     Mouth: Mucous membranes are moist.     Pharynx: Oropharynx is clear. No oropharyngeal exudate or posterior oropharyngeal erythema.  Eyes:     General: Lids are normal. Vision grossly intact. Gaze aligned appropriately. No scleral icterus.       Right eye: No discharge.        Left eye: No discharge.     Extraocular Movements: Extraocular movements intact.     Conjunctiva/sclera: Conjunctivae normal.     Pupils: Pupils are equal, round, and reactive to light.     Funduscopic exam:    Right eye: Red reflex present.        Left eye: Red reflex present. Neck:     Thyroid: No thyromegaly.     Vascular: No JVD.     Trachea: Trachea and phonation normal. No tracheal deviation.  Cardiovascular:      Rate and Rhythm: Normal rate and regular rhythm.     Pulses: Normal pulses.     Heart sounds: Normal heart sounds, S1 normal and S2 normal. No murmur heard.   No friction rub. No gallop.  Pulmonary:     Effort: Pulmonary effort is normal. No accessory muscle usage or respiratory distress.     Breath sounds: Normal breath sounds. No stridor. No wheezing or rales.  Chest:     Chest wall: No tenderness.  Abdominal:     General: Bowel sounds are normal. There is no distension.     Palpations: Abdomen is soft. There is no  mass.     Tenderness: There is no abdominal tenderness. There is no guarding or rebound.  Musculoskeletal:        General: No tenderness or deformity. Normal range of motion.     Cervical back: Normal range of motion and neck supple.  Lymphadenopathy:     Cervical: No cervical adenopathy.  Skin:    General: Skin is warm and dry.     Capillary Refill: Capillary refill takes less than 2 seconds.     Coloration: Skin is not pale.     Findings: No erythema or rash.  Neurological:     Mental Status: She is alert and oriented to person, place, and time.     Motor: No abnormal muscle tone.     Coordination: Coordination normal.     Deep Tendon Reflexes: Reflexes are normal and symmetric.  Psychiatric:        Mood and Affect: Mood normal.        Behavior: Behavior normal. Behavior is cooperative.        Thought Content: Thought content normal.        Judgment: Judgment normal.     Assessment/Plan: 1. Encounter for general adult medical examination with abnormal findings Age-appropriate preventive screenings and vaccinations discussed, annual physical exam completed. Routine labs for health maintenance ordered, see below. PHM updated.    2. Essential hypertension Stable, refills ordered - hydrochlorothiazide (HYDRODIURIL) 12.5 MG tablet; Take 1 tablet (12.5 mg total) by mouth daily.  Dispense: 90 tablet; Refill: 2 - amLODipine (NORVASC) 10 MG tablet; Take 1 tablet  (10 mg total) by mouth daily.  Dispense: 90 tablet; Refill: 1 - Lipid Profile  3. Intractable migraine without status migrainosus, unspecified migraine type Currently uses OTC ibuprofen or tylenol as needed and this is enough to get her through an acute migraine. She takes venlafaxine for anxiety which can also be prescribed for migraine prevention.   4. Primary insomnia Stable with ambien, refill ordered - zolpidem (AMBIEN) 10 MG tablet; Take 1 tablet (10 mg total) by mouth at bedtime as needed for sleep.  Dispense: 30 tablet; Refill: 2  5. Generalized anxiety disorder Stable, taking venlafaxine, refill ordered - venlafaxine XR (EFFEXOR XR) 37.5 MG 24 hr capsule; Take 1 capsule (37.5 mg total) by mouth daily with breakfast.  Dispense: 30 capsule; Refill: 1  6. Other fatigue Having more fatigue, feeling tired, could be related to menopause. Labs ordered to rule out hypothyroidism, electrolyte imbalance and impaired fasting glucose.  - CMP14+EGFR - TSH + free T4  7. Menopause Labs ordered to evaluate menopausal state and cause of increased fatigue - FSH/LH - Estradiol  8. History of iron deficiency History of low iron, due to increased fatigue, labs ordered to rule out anemia,  - CBC with Differential/Platelet - Iron, TIBC and Ferritin Panel - B12 and Folate Panel  9. Vitamin D deficiency Rule out low vitamin  D - Vitamin D (25 hydroxy)  10. Encounter for screening mammogram for malignant neoplasm of breast Mammogram previously ordered, reminded patient to call [facility] to schedule  11. Dysuria Routine urinalysis done. - UA/M w/rflx Culture, Routine      General Counseling: [name] understanding of the findings of todays visit and agrees with plan of treatment. I have discussed any further diagnostic evaluation that may be needed or ordered today. We also reviewed her medications today. she has been encouraged to call the office with any questions or concerns that  should arise related to todays visit.  Orders Placed This Encounter  Procedures   Microscopic Examination   Urine Culture, Reflex   UA/M w/rflx Culture, Routine   CBC with Differential/Platelet   CMP14+EGFR   Lipid Profile   TSH + free T4   Vitamin D (25 hydroxy)   Iron, TIBC and Ferritin Panel   B12 and Folate Panel   FSH/LH   Estradiol    Meds ordered this encounter  Medications   zolpidem (AMBIEN) 10 MG tablet    Sig: Take 1 tablet (10 mg total) by mouth at bedtime as needed for sleep.    Dispense:  30 tablet    Refill:  2    Please do not run with insurance. Will have Davis card.   hydrochlorothiazide (HYDRODIURIL) 12.5 MG tablet    Sig: Take 1 tablet (12.5 mg total) by mouth daily.    Dispense:  90 tablet    Refill:  2   amLODipine (NORVASC) 10 MG tablet    Sig: Take 1 tablet (10 mg total) by mouth daily.    Dispense:  90 tablet    Refill:  1   venlafaxine XR (EFFEXOR XR) 37.5 MG 24 hr capsule    Sig: Take 1 capsule (37.5 mg total) by mouth daily with breakfast.    Dispense:  30 capsule    Refill:  1    Return in about 3 months (around 04/02/2021) for F/U, med refill, Ronelle Smallman PCP.   Total time spent:30 Minutes Time spent includes review of chart, medications, test results, and follow up plan with the patient.   [place] Controlled Substance Database was reviewed by me.  This patient was seen by Jonetta Osgood, FNP-C in collaboration with Dr. Clayborn Bigness as a part of collaborative care agreement.  Henslee Lottman R. Valetta Fuller, MSN, FNP-C Internal medicine

## 2021-01-01 LAB — CBC WITH DIFFERENTIAL/PLATELET
Basophils Absolute: 0.1 10*3/uL (ref 0.0–0.2)
Basos: 1 %
EOS (ABSOLUTE): 0.2 10*3/uL (ref 0.0–0.4)
Eos: 2 %
Hematocrit: 40 % (ref 34.0–46.6)
Hemoglobin: 13.7 g/dL (ref 11.1–15.9)
Immature Grans (Abs): 0 10*3/uL (ref 0.0–0.1)
Immature Granulocytes: 1 %
Lymphocytes Absolute: 1.8 10*3/uL (ref 0.7–3.1)
Lymphs: 21 %
MCH: 30.7 pg (ref 26.6–33.0)
MCHC: 34.3 g/dL (ref 31.5–35.7)
MCV: 90 fL (ref 79–97)
Monocytes Absolute: 0.6 10*3/uL (ref 0.1–0.9)
Monocytes: 7 %
Neutrophils Absolute: 6 10*3/uL (ref 1.4–7.0)
Neutrophils: 68 %
Platelets: 333 10*3/uL (ref 150–450)
RBC: 4.46 x10E6/uL (ref 3.77–5.28)
RDW: 12 % (ref 11.7–15.4)
WBC: 8.8 10*3/uL (ref 3.4–10.8)

## 2021-01-01 LAB — CMP14+EGFR
ALT: 15 IU/L (ref 0–32)
AST: 17 IU/L (ref 0–40)
Albumin/Globulin Ratio: 1.4 (ref 1.2–2.2)
Albumin: 4 g/dL (ref 3.8–4.9)
Alkaline Phosphatase: 52 IU/L (ref 44–121)
BUN/Creatinine Ratio: 21 (ref 9–23)
BUN: 14 mg/dL (ref 6–24)
Bilirubin Total: 0.3 mg/dL (ref 0.0–1.2)
CO2: 20 mmol/L (ref 20–29)
Calcium: 9.4 mg/dL (ref 8.7–10.2)
Chloride: 101 mmol/L (ref 96–106)
Creatinine, Ser: 0.66 mg/dL (ref 0.57–1.00)
Globulin, Total: 2.9 g/dL (ref 1.5–4.5)
Glucose: 96 mg/dL (ref 65–99)
Potassium: 4 mmol/L (ref 3.5–5.2)
Sodium: 137 mmol/L (ref 134–144)
Total Protein: 6.9 g/dL (ref 6.0–8.5)
eGFR: 104 mL/min/{1.73_m2} (ref >59)

## 2021-01-01 LAB — TSH+FREE T4
Free T4: 1.21 ng/dL (ref 0.82–1.77)
TSH: 2.42 u[IU]/mL (ref 0.450–4.500)

## 2021-01-01 LAB — LIPID PANEL
Chol/HDL Ratio: 3.1 ratio (ref 0.0–4.4)
Cholesterol, Total: 228 mg/dL — ABNORMAL HIGH (ref 100–199)
HDL: 73 mg/dL (ref >39)
LDL Chol Calc (NIH): 136 mg/dL — ABNORMAL HIGH (ref 0–99)
Triglycerides: 107 mg/dL (ref 0–149)
VLDL Cholesterol Cal: 19 mg/dL (ref 5–40)

## 2021-01-01 LAB — IRON,TIBC AND FERRITIN PANEL
Ferritin: 186 ng/mL — ABNORMAL HIGH (ref 15–150)
Iron Saturation: 18 % (ref 15–55)
Iron: 74 ug/dL (ref 27–159)
Total Iron Binding Capacity: 415 ug/dL (ref 250–450)
UIBC: 341 ug/dL (ref 131–425)

## 2021-01-01 LAB — B12 AND FOLATE PANEL
Folate: 6.1 ng/mL (ref >3.0)
Vitamin B-12: 281 pg/mL (ref 232–1245)

## 2021-01-01 LAB — FSH/LH
FSH: <0.3 m[IU]/mL
LH: <0.3 m[IU]/mL

## 2021-01-01 LAB — VITAMIN D 25 HYDROXY (VIT D DEFICIENCY, FRACTURES): Vit D, 25-Hydroxy: 37.1 ng/mL (ref 30.0–100.0)

## 2021-01-01 LAB — ESTRADIOL: Estradiol: 2590 pg/mL

## 2021-01-03 LAB — UA/M W/RFLX CULTURE, ROUTINE
Bilirubin, UA: NEGATIVE
Glucose, UA: NEGATIVE
Ketones, UA: NEGATIVE
Nitrite, UA: NEGATIVE
Protein,UA: NEGATIVE
RBC, UA: NEGATIVE
Specific Gravity, UA: 1.022 (ref 1.005–1.030)
Urobilinogen, Ur: 0.2 mg/dL (ref 0.2–1.0)
pH, UA: 6 (ref 5.0–7.5)

## 2021-01-03 LAB — URINE CULTURE, REFLEX

## 2021-01-03 LAB — MICROSCOPIC EXAMINATION
Bacteria, UA: NONE SEEN
Casts: NONE SEEN /lpf
Epithelial Cells (non renal): NONE SEEN /hpf (ref 0–10)
RBC, Urine: NONE SEEN /hpf (ref 0–2)

## 2021-01-19 ENCOUNTER — Encounter: Payer: Self-pay | Admitting: Internal Medicine

## 2021-01-19 ENCOUNTER — Other Ambulatory Visit: Payer: Self-pay

## 2021-01-19 ENCOUNTER — Ambulatory Visit (INDEPENDENT_AMBULATORY_CARE_PROVIDER_SITE_OTHER): Payer: BC Managed Care – PPO | Admitting: Internal Medicine

## 2021-01-19 VITALS — BP 118/80 | HR 81 | Temp 97.8°F | Resp 16 | Ht 63.0 in | Wt 137.0 lb

## 2021-01-19 DIAGNOSIS — Z1211 Encounter for screening for malignant neoplasm of colon: Secondary | ICD-10-CM | POA: Diagnosis not present

## 2021-01-19 DIAGNOSIS — R7989 Other specified abnormal findings of blood chemistry: Secondary | ICD-10-CM | POA: Diagnosis not present

## 2021-01-19 DIAGNOSIS — E538 Deficiency of other specified B group vitamins: Secondary | ICD-10-CM | POA: Diagnosis not present

## 2021-01-19 DIAGNOSIS — E782 Mixed hyperlipidemia: Secondary | ICD-10-CM | POA: Diagnosis not present

## 2021-01-19 MED ORDER — CYANOCOBALAMIN 1000 MCG/ML IJ SOLN
1000.0000 ug | Freq: Once | INTRAMUSCULAR | Status: AC
Start: 2021-01-19 — End: 2021-01-19
  Administered 2021-01-19: 1000 ug via INTRAMUSCULAR

## 2021-01-19 NOTE — Progress Notes (Signed)
[facility] [place], [place] [address]  Internal MEDICINE  Office Visit Note  Patient Name: Lisa Osborn  [id number]  [id number]  Date of Service: 01/23/2021  Chief Complaint  Patient presents with   Follow-up    labs   Hypertension   Quality Metric Gaps    Colonoscopy     HPI Pt is seen for follow up on multiple abnormal labs  Pt has high estradiol level along with elevated FSH/LH level. Has h/o hysterectomy  B12 level is low and Ferritin is elevated  Abnormal lipid profile as well  Will be scheduled for  Lipid Panel     Component Value Date/Time   CHOL 228 (H) 12/31/2020 1109   TRIG 107 12/31/2020 1109   HDL 73 12/31/2020 1109   CHOLHDL 3.1 12/31/2020 1109   LDLCALC 136 (H) 12/31/2020 1109   LABVLDL 19 12/31/2020 1109     Current Medication: Outpatient Encounter Medications as of 01/19/2021  Medication Sig   amLODipine (NORVASC) 10 MG tablet Take 1 tablet (10 mg total) by mouth daily.   EPINEPHrine 0.3 mg/0.3 mL IJ SOAJ injection Inject into the muscle once.   hydrochlorothiazide (HYDRODIURIL) 12.5 MG tablet Take 1 tablet (12.5 mg total) by mouth daily.   zolpidem (AMBIEN) 10 MG tablet Take 1 tablet (10 mg total) by mouth at bedtime as needed for sleep.   [DISCONTINUED] venlafaxine XR (EFFEXOR XR) 37.5 MG 24 hr capsule Take 1 capsule (37.5 mg total) by mouth daily with breakfast.   [EXPIRED] cyanocobalamin ((VITAMIN B-12)) injection 1,000 mcg    No facility-administered encounter medications on file as of 01/19/2021.    Surgical History: Past Surgical History:  Procedure Laterality Date   ABDOMINAL HYSTERECTOMY      Medical History: Past Medical History:  Diagnosis Date   Allergy    Gout    Hypertension     Family History: Family History  Problem Relation Age of Onset   Breast cancer Mother    Hyperlipidemia Mother    Breast cancer Paternal Aunt    Breast cancer Maternal Grandmother    Breast cancer Cousin        pat  cousin   Cancer Father    Hyperlipidemia Father    Hypertension Father    Brain cancer Brother     Social History   Socioeconomic History   Marital status: Married    Spouse name: Not on file   Number of children: Not on file   Years of education: Not on file   Highest education level: Not on file  Occupational History   Not on file  Tobacco Use   Smoking status: Never   Smokeless tobacco: Never  Vaping Use   Vaping Use: Never used  Substance and Sexual Activity   Alcohol use: Yes    Comment: ocassionally   Drug use: Never   Sexual activity: Not on file  Other Topics Concern   Not on file  Social History Narrative   Not on file   Social Determinants of Health   Financial Resource Strain: Not on file  Food Insecurity: Not on file  Transportation Needs: Not on file  Physical Activity: Not on file  Stress: Not on file  Social Connections: Not on file  Intimate Partner Violence: Not on file      Review of Systems  Constitutional:  Negative for fatigue and fever.  HENT:  Negative for congestion, mouth sores and postnasal drip.   Respiratory:  Negative for  cough.   Cardiovascular:  Negative for chest pain.  Genitourinary:  Negative for flank pain.  Psychiatric/Behavioral: Negative.     Vital Signs: BP 118/80   Pulse 81   Temp 97.8 F (36.6 C)   Resp 16   Ht _0  (1.6 m)   Wt 137 lb (62.1 kg)   SpO2 98%   BMI 24.27 kg/m    Physical Exam Constitutional:      Appearance: Normal appearance.  HENT:     Head: Normocephalic and atraumatic.     Nose: Nose normal.     Mouth/Throat:     Mouth: Mucous membranes are moist.     Pharynx: No posterior oropharyngeal erythema.  Eyes:     Extraocular Movements: Extraocular movements intact.     Pupils: Pupils are equal, round, and reactive to light.  Cardiovascular:     Pulses: Normal pulses.     Heart sounds: Normal heart sounds.  Pulmonary:     Effort: Pulmonary effort is normal.     Breath sounds: Normal  breath sounds.  Abdominal:     General: There is distension.     Palpations: There is no mass.  Neurological:     General: No focal deficit present.     Mental Status: She is alert.  Psychiatric:        Mood and Affect: Mood normal.        Behavior: Behavior normal.     Assessment/Plan: 1. High serum estradiol Will repeat all labs ( central or end organ 0 - US PELVIS (TRANSABDOMINAL ONLY); Future - CA 125 - Prolactin - 17-Hydroxyprogesterone - Sed Rate (ESR) - C-reactive protein  2. B12 deficiency Will start on monthly B12 injection  - cyanocobalamin ((VITAMIN B-12)) injection 1,000 mcg  3. Elevated ferritin level Will monitor   4. Mixed hyperlipidemia Life style and dietary modification for now. Will add therapy after pelvic US results are available   5. Screening for colon cancer Needs colon cancer screening  - Ambulatory referral to Gastroenterology   General Counseling: Lisa Osborn understanding of the findings of todays visit and agrees with plan of treatment. I have discussed any further diagnostic evaluation that may be needed or ordered today. We also reviewed her medications today. she has been encouraged to call the office with any questions or concerns that should arise related to todays visit.    Orders Placed This Encounter  Procedures   US PELVIS (TRANSABDOMINAL ONLY)   CA 125   Prolactin   17-Hydroxyprogesterone   Sed Rate (ESR)   C-reactive protein   Ambulatory referral to Gastroenterology    Meds ordered this encounter  Medications   cyanocobalamin ((VITAMIN B-12)) injection 1,000 mcg    Total time spent:35 Minutes Time spent includes review of chart, medications, test results, and follow up plan with the patient.   [place] Controlled Substance Database was reviewed by me.   Dr Lavera Guise Internal medicine

## 2021-01-21 ENCOUNTER — Other Ambulatory Visit (INDEPENDENT_AMBULATORY_CARE_PROVIDER_SITE_OTHER): Payer: Self-pay

## 2021-01-21 ENCOUNTER — Other Ambulatory Visit: Payer: Self-pay | Admitting: Gastroenterology

## 2021-01-21 DIAGNOSIS — Z1211 Encounter for screening for malignant neoplasm of colon: Secondary | ICD-10-CM

## 2021-01-21 MED ORDER — CLENPIQ 10-3.5-12 MG-GM -GM/160ML PO SOLN: 320.0000 mL | Freq: Once | ORAL | 0 refills | Status: AC

## 2021-01-21 MED ORDER — NA SULFATE-K SULFATE-MG SULF 17.5-3.13-1.6 GM/177ML PO SOLN: 1.0000 | Freq: Once | ORAL | 0 refills | Status: AC

## 2021-01-21 NOTE — Progress Notes (Signed)
Gastroenterology Pre-Procedure Review  Request Date: 02/16/21 Requesting Physician: Dr. Marius Ditch  PATIENT REVIEW QUESTIONS: The patient responded to the following health history questions as indicated:    1. Are you having any GI issues? no 2. Do you have a personal history of Polyps? no 3. Do you have a family history of Colon Cancer or Polyps? yes (Father colon polyps) 4. Diabetes Mellitus? no 5. Joint replacements in the past 12 months?no 6. Major health problems in the past 3 months?no 7. Any artificial heart valves, MVP, or defibrillator?no    MEDICATIONS & ALLERGIES:    Patient reports the following regarding taking any anticoagulation/antiplatelet therapy:   Plavix, Coumadin, Eliquis, Xarelto, Lovenox, Pradaxa, Brilinta, or Effient? no Aspirin? no  Patient confirms/reports the following medications:  Current Outpatient Medications  Medication Sig Dispense Refill   amLODipine (NORVASC) 10 MG tablet Take 1 tablet (10 mg total) by mouth daily. 90 tablet 1   EPINEPHrine 0.3 mg/0.3 mL IJ SOAJ injection Inject into the muscle once.     hydrochlorothiazide (HYDRODIURIL) 12.5 MG tablet Take 1 tablet (12.5 mg total) by mouth daily. 90 tablet 2   venlafaxine XR (EFFEXOR XR) 37.5 MG 24 hr capsule Take 1 capsule (37.5 mg total) by mouth daily with breakfast. 30 capsule 1   zolpidem (AMBIEN) 10 MG tablet Take 1 tablet (10 mg total) by mouth at bedtime as needed for sleep. 30 tablet 2   No current facility-administered medications for this visit.    Patient confirms/reports the following allergies:  Allergies  Allergen Reactions   Bee Venom Anaphylaxis   Shellfish Allergy Anaphylaxis   Compazine  [Prochlorperazine Edisylate]    Codeine Itching and Rash    No orders of the defined types were placed in this encounter.   AUTHORIZATION INFORMATION Primary Insurance: 1D#: Group #:  Secondary Insurance: 1D#: Group #:  SCHEDULE INFORMATION: Date: 02/16/21 Time: Location:  [place]

## 2021-01-22 ENCOUNTER — Other Ambulatory Visit: Payer: Self-pay | Admitting: Nurse Practitioner

## 2021-01-22 DIAGNOSIS — F411 Generalized anxiety disorder: Secondary | ICD-10-CM

## 2021-01-22 LAB — 17-HYDROXYPROGESTERONE: 17-Hydroxyprogesterone: 342 ng/dL

## 2021-01-22 LAB — CA 125: Cancer Antigen (CA) 125: 10.6 U/mL (ref 0.0–38.1)

## 2021-01-22 LAB — SEDIMENTATION RATE: Sed Rate: 4 mm/h (ref 0–40)

## 2021-01-22 LAB — PROLACTIN: Prolactin: 22.6 ng/mL (ref 4.8–23.3)

## 2021-01-22 LAB — C-REACTIVE PROTEIN: CRP: 3 mg/L (ref 0–10)

## 2021-02-02 ENCOUNTER — Telehealth: Payer: Self-pay

## 2021-02-02 NOTE — Telephone Encounter (Signed)
Left vm for patient to return my call to see if she has scheduled u/s @ [facility] yet-Lisa Osborn

## 2021-02-02 NOTE — Telephone Encounter (Signed)
Patient returned call regarding u/s. She stated she has not yet been contacted to schedule appointment. I gave her tele # [phone] to schedule-Toni

## 2021-02-09 ENCOUNTER — Other Ambulatory Visit: Payer: Self-pay

## 2021-02-09 ENCOUNTER — Ambulatory Visit
Admission: RE | Admit: 2021-02-09 | Discharge: 2021-02-09 | Disposition: A | Discharge status: Home / Self Care | Payer: BC Managed Care – PPO | Source: Ambulatory Visit | Attending: Internal Medicine | Admitting: Internal Medicine

## 2021-02-09 ENCOUNTER — Other Ambulatory Visit: Payer: Self-pay | Admitting: Nurse Practitioner

## 2021-02-09 ENCOUNTER — Telehealth: Payer: Self-pay

## 2021-02-09 ENCOUNTER — Telehealth: Payer: Self-pay | Admitting: Nurse Practitioner

## 2021-02-09 DIAGNOSIS — R7989 Other specified abnormal findings of blood chemistry: Secondary | ICD-10-CM

## 2021-02-09 DIAGNOSIS — R1900 Intra-abdominal and pelvic swelling, mass and lump, unspecified site: Secondary | ICD-10-CM

## 2021-02-09 NOTE — Telephone Encounter (Signed)
Lisa Osborn had previous labs done that were concerning. Her estradiol level was 2590, FSH <0.3 and LH <0.3. To follow up on these abnormal lab results, a pelvic ultrasound was performed today, transabdominal and transvaginal. The results were called in today and are as follows:   FINDINGS: Uterus   Surgically absent   Endometrium   Surgically absent   Right ovary   Measurements: 2.2 x 1.1 x 1.3 cm = volume: 1.6 mL. Seen only on transabdominal imaging. Normal morphology without mass   Left ovary   Measurements: 1.9 x 0.7 x 1.2 cm = volume: 0.8 mL. Seen only on transabdominal imaging. Normal morphology without mass.   Other findings   No free pelvic fluid. Large complex solid mass with multiple tiny cystic foci identified at the midline in the pelvis, 11.6 x 8.7 x 10.4 cm, demonstrating internal blood flow on color Doppler imaging. Finding is consistent with a large neoplasm though organ of origin is uncertain, with what appear to be normal ovaries identified on transabdominal imaging.   IMPRESSION: Surgical absence of uterus with what appear to be normal appearing ovaries bilaterally.   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   --Concern for possible malignancy discussed with patient and need for surgical evaluation. She was informed that an urgent referral will be placed today for her to be seen ASAP.  She had many questions and all questions were answered appropriately. She reports that she does have a family history of cancer and a family member who died from a cancer involving a hormone-secreting tumor.   The ultrasound findings are inconclusive regarding the organ of origin. It may be  originating from an ovary but the ovaries were normal in size and structure. It may be non-gynecologic in origin. Will refer to OB-GYN for gynecologic surgical evaluation.   She is scheduled for a routine screening colonoscopy on 02/16/21. Reached out to Dr. Marius Ditch to determine if her procedure should be postponed.   1. Pelvic mass in female Referring to gynecologic oncology in [place]. Referral is urgent. [facility] in [place].  Providers: Dr. Janie Morning, Dr. Gillian Scarce, Dr. Everitt Amber, and Dr. Corey Skains Clarke-Pearson.  [employer]. [facility] [place], [place]  [phone]  - Ambulatory referral to Gynecology

## 2021-02-10 ENCOUNTER — Telehealth: Payer: Self-pay | Admitting: *Deleted

## 2021-02-10 NOTE — Telephone Encounter (Signed)
Spoke with the patient and scheduled a new patient appt on 9/23 at 9:45 am with Dr Berline Lopes. Patient given the address and phone number for the clinic; along with the policy for mask and visitors

## 2021-02-10 NOTE — Telephone Encounter (Signed)
Lisa Osborn spoke with pt yesterday and today

## 2021-02-13 ENCOUNTER — Inpatient Hospital Stay: Payer: BC Managed Care – PPO | Attending: Gynecologic Oncology | Admitting: Gynecologic Oncology

## 2021-02-13 ENCOUNTER — Encounter: Payer: Self-pay | Admitting: Gynecologic Oncology

## 2021-02-13 ENCOUNTER — Other Ambulatory Visit: Payer: Self-pay

## 2021-02-13 ENCOUNTER — Other Ambulatory Visit: Payer: Self-pay | Admitting: Gynecologic Oncology

## 2021-02-13 ENCOUNTER — Inpatient Hospital Stay (HOSPITAL_BASED_OUTPATIENT_CLINIC_OR_DEPARTMENT_OTHER): Payer: BC Managed Care – PPO | Admitting: Gynecologic Oncology

## 2021-02-13 ENCOUNTER — Inpatient Hospital Stay: Payer: BC Managed Care – PPO

## 2021-02-13 VITALS — BP 118/67 | HR 77 | Temp 98.3°F | Resp 18 | Ht 63.0 in | Wt 140.3 lb

## 2021-02-13 DIAGNOSIS — Z79899 Other long term (current) drug therapy: Secondary | ICD-10-CM | POA: Insufficient documentation

## 2021-02-13 DIAGNOSIS — F419 Anxiety disorder, unspecified: Secondary | ICD-10-CM | POA: Insufficient documentation

## 2021-02-13 DIAGNOSIS — Z807 Family history of other malignant neoplasms of lymphoid, hematopoietic and related tissues: Secondary | ICD-10-CM | POA: Diagnosis not present

## 2021-02-13 DIAGNOSIS — K219 Gastro-esophageal reflux disease without esophagitis: Secondary | ICD-10-CM | POA: Insufficient documentation

## 2021-02-13 DIAGNOSIS — Z90710 Acquired absence of both cervix and uterus: Secondary | ICD-10-CM | POA: Diagnosis not present

## 2021-02-13 DIAGNOSIS — R1900 Intra-abdominal and pelvic swelling, mass and lump, unspecified site: Secondary | ICD-10-CM | POA: Insufficient documentation

## 2021-02-13 DIAGNOSIS — R5383 Other fatigue: Secondary | ICD-10-CM | POA: Diagnosis not present

## 2021-02-13 DIAGNOSIS — E280 Estrogen excess: Secondary | ICD-10-CM

## 2021-02-13 DIAGNOSIS — R6881 Early satiety: Secondary | ICD-10-CM | POA: Insufficient documentation

## 2021-02-13 DIAGNOSIS — Z809 Family history of malignant neoplasm, unspecified: Secondary | ICD-10-CM | POA: Diagnosis not present

## 2021-02-13 DIAGNOSIS — Z800 Family history of malignant neoplasm of digestive organs: Secondary | ICD-10-CM | POA: Insufficient documentation

## 2021-02-13 DIAGNOSIS — Z808 Family history of malignant neoplasm of other organs or systems: Secondary | ICD-10-CM | POA: Insufficient documentation

## 2021-02-13 DIAGNOSIS — R634 Abnormal weight loss: Secondary | ICD-10-CM | POA: Insufficient documentation

## 2021-02-13 DIAGNOSIS — E785 Hyperlipidemia, unspecified: Secondary | ICD-10-CM | POA: Diagnosis not present

## 2021-02-13 DIAGNOSIS — Z8042 Family history of malignant neoplasm of prostate: Secondary | ICD-10-CM | POA: Diagnosis not present

## 2021-02-13 DIAGNOSIS — Z8051 Family history of malignant neoplasm of kidney: Secondary | ICD-10-CM | POA: Insufficient documentation

## 2021-02-13 DIAGNOSIS — F32A Depression, unspecified: Secondary | ICD-10-CM | POA: Diagnosis not present

## 2021-02-13 DIAGNOSIS — Z803 Family history of malignant neoplasm of breast: Secondary | ICD-10-CM | POA: Diagnosis not present

## 2021-02-13 DIAGNOSIS — I10 Essential (primary) hypertension: Secondary | ICD-10-CM | POA: Insufficient documentation

## 2021-02-13 LAB — BASIC METABOLIC PANEL
Anion gap: 9 (ref 5–15)
BUN: 11 mg/dL (ref 6–20)
CO2: 26 mmol/L (ref 22–32)
Calcium: 9.7 mg/dL (ref 8.9–10.3)
Chloride: 102 mmol/L (ref 98–111)
Creatinine, Ser: 0.68 mg/dL (ref 0.44–1.00)
GFR, Estimated: >60 mL/min (ref >60)
Glucose, Bld: 101 mg/dL — ABNORMAL HIGH (ref 70–99)
Potassium: 4.2 mmol/L (ref 3.5–5.1)
Sodium: 137 mmol/L (ref 135–145)

## 2021-02-13 MED ORDER — TRAMADOL HCL 50 MG PO TABS: 50.0000 mg | ORAL_TABLET | Freq: Four times a day (QID) | ORAL | 0 refills | Status: DC | PRN

## 2021-02-13 MED ORDER — IBUPROFEN 800 MG PO TABS: 800.0000 mg | ORAL_TABLET | Freq: Three times a day (TID) | ORAL | 0 refills | Status: AC | PRN

## 2021-02-13 MED ORDER — SENNOSIDES-DOCUSATE SODIUM 8.6-50 MG PO TABS: 2.0000 | ORAL_TABLET | Freq: Every day | ORAL | 0 refills | Status: DC

## 2021-02-13 NOTE — Progress Notes (Signed)
GYNECOLOGIC ONCOLOGY NEW PATIENT CONSULTATION   Patient Name: Lisa Osborn  Patient Age: 55 y.o. 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  The patient presented today with her daughter. The three of [place] discussed recent lab work as well as ultrasound. Given ultrasound findings of a pelvic mass and a very elevated estradiol level, this is concerning for an estrogen secreting tumor, such as a granulosa cell tumor. We will plan to get in inhibin B today and a CT scan was ordered.  In terms of management, I discussed that surgical excision is the only way to make a definitive diagnosis. I recommend that we move forward with surgery both for diagnosis and therapeutic benefit. I recommend diagnostic laparoscopy, robotic bilateral salpingo-oophorectomy, possible laparotomy, and possible staging. Plan will be for excision of the pelvic mass with placement and an Endo catch bag. Will plan to perform contained cyst drainage so that no cyst fluid is spilled within the pelvis. The mass will then be sent to pathology for frozen section. If no malignancy identified, then no additional surgery will be performed. If malignancy is identified, and additional surgical staging would be performed as indicated, likely involving mini laparotomy with intra-abdominal and pelvic organ palpation.  The risks of surgery were discussed in detail and she understands these to include infection; wound separation; hernia; injury to adjacent organs such as bowel, bladder, blood vessels, ureters and nerves; bleeding which may require blood transfusion; anesthesia risk; thromboembolic events; possible death; unforeseen complications; possible need for  re-exploration; medical complications such as heart attack, stroke, pleural effusion and pneumonia; and, if full lymphadenectomy is performed the risk of lymphedema and lymphocyst. The patient will receive DVT and antibiotic prophylaxis as indicated. She voiced a clear understanding. She had the opportunity to ask questions. Perioperative instructions were reviewed with her. Prescriptions for post-op medications were sent to her pharmacy of choice.  Will place genetics referral given family history significant for multiple BRCA-associated cancer diagnoses.   A copy of this note was sent to the patient's referring provider.   80 minutes of total time was spent for this patient encounter, including preparation, face-to-face counseling with the patient and coordination of care, and documentation of the encounter.   Jeral Pinch, MD  Division of Gynecologic Oncology  Department of Obstetrics and Gynecology  [facility]  ___________________________________________  Chief Complaint: Chief Complaint  Patient presents with   Pelvic mass    History of Present Illness:  Lisa Osborn is a 55 y.o. y.o. female who is seen in consultation at the request of Jonetta Osgood, NP for an evaluation of complex pelvic mass.  Patient was seen recently for an annual exam and mentions several symptoms that she think she has had for at least the last 6 months.  These include abdominal bloating, increased fatigue, weight loss followed by abdominal bloating, and mood swings.  She thought that maybe these symptoms were related to hormonal changes related to menopause.  She denied any hot flashes.  She underwent ultrasound which showed a pelvic mass.  Lab testing showed significantly depressed LH and FSH with elevated estrogen.  Patient endorses a good appetite although notes that she is always trying to diet.  She denies any nausea or emesis.  She endorses early satiety as well as  abdominal bloating.  She has a history of GERD and was previously on medications but is not currently.  She intermittently feels like food gets stuck in her throat when she is swallowing it.  She has constipation intermittently and since her cholecystectomy can have diarrhea around the time of eating.  She notes some dyspareunia as well as some difficulty emptying her bladder and her bowels.  She describes this as having to push to defecate or urinate.  She denies any abdominal pain or cramping.  Her GYN history is notable for vaginal hysterectomy about 10 years ago for uterine fibroids.  She also reports having "mesh" placed for her bladder secondary to incontinence.  Patient has a colonoscopy scheduled for next week and a mammogram scheduled in October.  Patient's family history is quite significant.  Her father had skin cancer, prostate cancer, and renal cell cancer.  Renal cell cancer is ultimately what caused his demise.  She has a brother who was treated for glioblastoma.  One of her paternal uncles had gastric cancer and currently has pancreatic cancer.  It sounds like he is in the process of getting genetic testing.  She has a paternal aunt with breast cancer and a paternal aunt with non-Hodgkin's lymphoma.  Patient lives in [place] with her husband.  She reports occasional alcohol use, denies tobacco use.  She just retired from [employer].  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  She had COVID 2 weeks ago - tested positive. Has taken home test since that was negative. No symptoms.  PAST MEDICAL HISTORY:  Past Medical History:  Diagnosis Date   Allergy    Anxiety    Depression    Gout    HLD (hyperlipidemia)    Hypertension      PAST SURGICAL HISTORY:  Past Surgical History:  Procedure Laterality Date   CESAREAN SECTION     CHOLECYSTECTOMY     NOSE SURGERY     Surgery for deviated septum   OTHER SURGICAL  HISTORY     Mesh placement for bladder prolapse versus urinary incontinence   VAGINAL HYSTERECTOMY      OB/GYN HISTORY:  OB History  Gravida Para Term Preterm AB Living  4            SAB IAB Ectopic Multiple Live Births               # Outcome Date GA Lbr Len/2nd Weight Sex Delivery Anes PTL Lv  4 Gravida           3 Gravida           2 Gravida           1 Gravida             No LMP recorded. Patient has had a hysterectomy.  Age at menarche: 19 Age at menopause: No menses since her hysterectomy, see HPI for other recent symptoms Hx of HRT: Denies Hx of STDs: Denies Last pap: Prior to her hysterectomy History of abnormal pap smears: Denies  SCREENING STUDIES:  Last mammogram: Scheduled for next month  Last colonoscopy: Scheduled next week  MEDICATIONS: Outpatient Encounter Medications as of 02/13/2021  Medication Sig   amLODipine (NORVASC) 10 MG tablet Take 1 tablet (10 mg total) by mouth daily.   CLENPIQ 10-3.5-12 MG-GM -GM/160ML SOLN SMARTSIG:320 Milliliter(s) By Mouth Once   EPINEPHrine 0.3 mg/0.3 mL  IJ SOAJ injection Inject into the muscle once.   hydrochlorothiazide (HYDRODIURIL) 12.5 MG tablet Take 1 tablet (12.5 mg total) by mouth daily.   ibuprofen (ADVIL) 800 MG tablet Take 1 tablet (800 mg total) by mouth every 8 (eight) hours as needed for moderate pain. For AFTER surgery only   senna-docusate (SENOKOT-S) 8.6-50 MG tablet Take 2 tablets by mouth at bedtime. For AFTER surgery, do not take if having diarrhea   traMADol (ULTRAM) 50 MG tablet Take 1 tablet (50 mg total) by mouth every 6 (six) hours as needed for severe pain. For AFTER surgery only, do not take and drive   venlafaxine XR (EFFEXOR-XR) 37.5 MG 24 hr capsule TAKE 1 CAPSULE BY MOUTH DAILY WITH BREAKFAST.   zolpidem (AMBIEN) 10 MG tablet Take 1 tablet (10 mg total) by mouth at bedtime as needed for sleep.   [DISCONTINUED] EPINEPHrine 0.3 mg/0.3 mL IJ SOAJ injection Inject into the muscle. (Patient not  taking: Reported on 02/13/2021)   No facility-administered encounter medications on file as of 02/13/2021.    ALLERGIES:  Allergies  Allergen Reactions   Bee Venom Anaphylaxis   Shellfish Allergy Anaphylaxis   Compazine  [Prochlorperazine Edisylate]    Codeine Itching and Rash     FAMILY HISTORY:  Family History  Problem Relation Age of Onset   Breast cancer Mother    Hyperlipidemia Mother    Cancer Father        RCC, prostate, skin   Hyperlipidemia Father    Hypertension Father    Brain cancer Brother    Breast cancer Maternal Grandmother    Breast cancer Paternal Aunt    Breast cancer Cousin        pat cousin   Pancreatic cancer Paternal Uncle    Gastric cancer Paternal Uncle      SOCIAL HISTORY:  Social Connections: Not on file    REVIEW OF SYSTEMS:  + Anxiety Denies appetite changes, fevers, chills, fatigue, unexplained weight changes. Denies hearing loss, neck lumps or masses, mouth sores, ringing in ears or voice changes. Denies cough or wheezing.  Denies shortness of breath. Denies chest pain or palpitations. Denies leg swelling. Denies abdominal distention, pain, blood in stools, constipation, diarrhea, nausea, vomiting, or early satiety. Denies pain with intercourse, dysuria, frequency, hematuria or incontinence. Denies hot flashes, pelvic pain, vaginal bleeding or vaginal discharge.   Denies joint pain, back pain or muscle pain/cramps. Denies itching, rash, or wounds. Denies dizziness, headaches, numbness or seizures. Denies swollen lymph nodes or glands, denies easy bruising or bleeding. Denies depression, confusion, or decreased concentration.  Physical Exam:  Vital Signs for this encounter:  Blood pressure 118/67, pulse 77, temperature 98.3 F (36.8 C), temperature source Oral, resp. rate 18, height _0  (1.6 m), weight 140 lb 4.8 oz (63.6 kg), SpO2 100 %. Body mass index is 24.85 kg/m. General: Alert, oriented, no acute distress.  HEENT:  Normocephalic, atraumatic. Sclera anicteric.  Chest: Clear to auscultation bilaterally. No wheezes, rhonchi, or rales. Cardiovascular: Regular rate and rhythm, no murmurs, rubs, or gallops.  Abdomen: Normoactive bowel sounds. Soft, nondistended, nontender to palpation. No masses or hepatosplenomegaly appreciated. No palpable fluid wave. Fullness in lower abdomen. Well healed lsc and Pfannenstiel incisions. Extremities: Grossly normal range of motion. Warm, well perfused. No edema bilaterally.  Skin: No rashes or lesions.  Lymphatics: No cervical, supraclavicular, or inguinal adenopathy.  GU:  Normal external female genitalia.  No lesions. No discharge or bleeding.  Bladder/urethra:  No lesions or masses.             Vagina: Well rugated, no lesions.             Cervix/uterus: surgically absent.             Adnexa: 10-12 cm mass appreciated at the top of the vaginal cuff and anterior, span almost up to the umbilicus in the mid pelvis, moderately mobile. No nodularity.  Rectal: No involvement, can't appreciate the mass rectally  LABORATORY AND RADIOLOGIC DATA:  Outside medical records were reviewed to synthesize the above history, along with the history and physical obtained during the visit.   Lab Results  Component Value Date   WBC 8.8 12/31/2020   HGB 13.7 12/31/2020   HCT 40.0 12/31/2020   PLT 333 12/31/2020   GLUCOSE 101 (H) 02/13/2021   CHOL 228 (H) 12/31/2020   TRIG 107 12/31/2020   HDL 73 12/31/2020   LDLCALC 136 (H) 12/31/2020   ALT 15 12/31/2020   AST 17 12/31/2020   NA 137 02/13/2021   K 4.2 02/13/2021   CL 102 02/13/2021   CREATININE 0.68 02/13/2021   BUN 11 02/13/2021   CO2 26 02/13/2021   TSH 2.420 12/31/2020   17OH P - 342 Prolactin - 22.6 CA-125: 10.6 Estradiol - 2,590 LH - <0.3 FSH - <0.3  Pelvic ultrasound on 9/19: IMPRESSION: Surgical absence of uterus with what appear to be normal appearing ovaries bilaterally.   However, an 11.6 x 8.7 x  10.4 cm diameter complex solid mass with internal blood flow and scattered tiny cystic foci is identified within the pelvis, question of ovarian or non gynecologic origin; in the setting of elevated estrogen levels, this could represent a functional ovarian tumor such as a granulosa cell tumor or a thecoma.

## 2021-02-13 NOTE — H&P (View-Only) (Signed)
GYNECOLOGIC ONCOLOGY NEW PATIENT CONSULTATION   Patient Name: Lisa Osborn  Patient Age: 55 y.o. 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  The patient presented today with her daughter. The three of us discussed recent lab work as well as ultrasound. Given ultrasound findings of a pelvic mass and a very elevated estradiol level, this is concerning for an estrogen secreting tumor, such as a granulosa cell tumor. We will plan to get in inhibin B today and a CT scan was ordered.  In terms of management, I discussed that surgical excision is the only way to make a definitive diagnosis. I recommend that we move forward with surgery both for diagnosis and therapeutic benefit. I recommend diagnostic laparoscopy, robotic bilateral salpingo-oophorectomy, possible laparotomy, and possible staging. Plan will be for excision of the pelvic mass with placement and an Endo catch bag. Will plan to perform contained cyst drainage so that no cyst fluid is spilled within the pelvis. The mass will then be sent to pathology for frozen section. If no malignancy identified, then no additional surgery will be performed. If malignancy is identified, and additional surgical staging would be performed as indicated, likely involving mini laparotomy with intra-abdominal and pelvic organ palpation.  The risks of surgery were discussed in detail and she understands these to include infection; wound separation; hernia; injury to adjacent organs such as bowel, bladder, blood vessels, ureters and nerves; bleeding which may require blood transfusion; anesthesia risk; thromboembolic events; possible death; unforeseen complications; possible need for  re-exploration; medical complications such as heart attack, stroke, pleural effusion and pneumonia; and, if full lymphadenectomy is performed the risk of lymphedema and lymphocyst. The patient will receive DVT and antibiotic prophylaxis as indicated. She voiced a clear understanding. She had the opportunity to ask questions. Perioperative instructions were reviewed with her. Prescriptions for post-op medications were sent to her pharmacy of choice.  Will place genetics referral given family history significant for multiple BRCA-associated cancer diagnoses.   A copy of this note was sent to the patient's referring provider.   80 minutes of total time was spent for this patient encounter, including preparation, face-to-face counseling with the patient and coordination of care, and documentation of the encounter.   Saliyah Gillin, MD  Division of Gynecologic Oncology  Department of Obstetrics and Gynecology  [facility]  ___________________________________________  Chief Complaint: Chief Complaint  Patient presents with   Pelvic mass    History of Present Illness:  Lisa Osborn is a 55 y.o. y.o. female who is seen in consultation at the request of Abernathy, Alyssa, NP for an evaluation of complex pelvic mass.  Patient was seen recently for an annual exam and mentions several symptoms that she think she has had for at least the last 6 months.  These include abdominal bloating, increased fatigue, weight loss followed by abdominal bloating, and mood swings.  She thought that maybe these symptoms were related to hormonal changes related to menopause.  She denied any hot flashes.  She underwent ultrasound which showed a pelvic mass.  Lab testing showed significantly depressed LH and FSH with elevated estrogen.  Patient endorses a good appetite although notes that she is always trying to diet.  She denies any nausea or emesis.    She endorses early satiety as well as  abdominal bloating.  She has a history of GERD and was previously on medications but is not currently.  She intermittently feels like food gets stuck in her throat when she is swallowing it.  She has constipation intermittently and since her cholecystectomy can have diarrhea around the time of eating.  She notes some dyspareunia as well as some difficulty emptying her bladder and her bowels.  She describes this as having to push to defecate or urinate.  She denies any abdominal pain or cramping.  Her GYN history is notable for vaginal hysterectomy about 10 years ago for uterine fibroids.  She also reports having "mesh" placed for her bladder secondary to incontinence.  Patient has a colonoscopy scheduled for next week and a mammogram scheduled in October.  Patient's family history is quite significant.  Her father had skin cancer, prostate cancer, and renal cell cancer.  Renal cell cancer is ultimately what caused his demise.  She has a brother who was treated for glioblastoma.  One of her paternal uncles had gastric cancer and currently has pancreatic cancer.  It sounds like he is in the process of getting genetic testing.  She has a paternal aunt with breast cancer and a paternal aunt with non-Hodgkin's lymphoma.  Patient lives in [place] with her husband.  She reports occasional alcohol use, denies tobacco use.  She just retired from teaching.  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  She had COVID 2 weeks ago - tested positive. Has taken home test since that was negative. No symptoms.  PAST MEDICAL HISTORY:  Past Medical History:  Diagnosis Date   Allergy    Anxiety    Depression    Gout    HLD (hyperlipidemia)    Hypertension      PAST SURGICAL HISTORY:  Past Surgical History:  Procedure Laterality Date   CESAREAN SECTION     CHOLECYSTECTOMY     NOSE SURGERY     Surgery for deviated septum   OTHER SURGICAL  HISTORY     Mesh placement for bladder prolapse versus urinary incontinence   VAGINAL HYSTERECTOMY      OB/GYN HISTORY:  OB History  Gravida Para Term Preterm AB Living  4            SAB IAB Ectopic Multiple Live Births               # Outcome Date GA Lbr Len/2nd Weight Sex Delivery Anes PTL Lv  4 Gravida           3 Gravida           2 Gravida           1 Gravida             No LMP recorded. Patient has had a hysterectomy.  Age at menarche: 10 Age at menopause: No menses since her hysterectomy, see HPI for other recent symptoms Hx of HRT: Denies Hx of STDs: Denies Last pap: Prior to her hysterectomy History of abnormal pap smears: Denies  SCREENING STUDIES:  Last mammogram: Scheduled for next month  Last colonoscopy: Scheduled next week  MEDICATIONS: Outpatient Encounter Medications as of 02/13/2021  Medication Sig   amLODipine (NORVASC) 10 MG tablet Take 1 tablet (10 mg total) by mouth daily.   CLENPIQ 10-3.5-12 MG-GM -GM/160ML SOLN SMARTSIG:320 Milliliter(s) By Mouth Once   EPINEPHrine 0.3 mg/0.3 mL   IJ SOAJ injection Inject into the muscle once.   hydrochlorothiazide (HYDRODIURIL) 12.5 MG tablet Take 1 tablet (12.5 mg total) by mouth daily.   ibuprofen (ADVIL) 800 MG tablet Take 1 tablet (800 mg total) by mouth every 8 (eight) hours as needed for moderate pain. For AFTER surgery only   senna-docusate (SENOKOT-S) 8.6-50 MG tablet Take 2 tablets by mouth at bedtime. For AFTER surgery, do not take if having diarrhea   traMADol (ULTRAM) 50 MG tablet Take 1 tablet (50 mg total) by mouth every 6 (six) hours as needed for severe pain. For AFTER surgery only, do not take and drive   venlafaxine XR (EFFEXOR-XR) 37.5 MG 24 hr capsule TAKE 1 CAPSULE BY MOUTH DAILY WITH BREAKFAST.   zolpidem (AMBIEN) 10 MG tablet Take 1 tablet (10 mg total) by mouth at bedtime as needed for sleep.   [DISCONTINUED] EPINEPHrine 0.3 mg/0.3 mL IJ SOAJ injection Inject into the muscle. (Patient not  taking: Reported on 02/13/2021)   No facility-administered encounter medications on file as of 02/13/2021.    ALLERGIES:  Allergies  Allergen Reactions   Bee Venom Anaphylaxis   Shellfish Allergy Anaphylaxis   Compazine  [Prochlorperazine Edisylate]    Codeine Itching and Rash     FAMILY HISTORY:  Family History  Problem Relation Age of Onset   Breast cancer Mother    Hyperlipidemia Mother    Cancer Father        RCC, prostate, skin   Hyperlipidemia Father    Hypertension Father    Brain cancer Brother    Breast cancer Maternal Grandmother    Breast cancer Paternal Aunt    Breast cancer Cousin        pat cousin   Pancreatic cancer Paternal Uncle    Gastric cancer Paternal Uncle      SOCIAL HISTORY:  Social Connections: Not on file    REVIEW OF SYSTEMS:  + Anxiety Denies appetite changes, fevers, chills, fatigue, unexplained weight changes. Denies hearing loss, neck lumps or masses, mouth sores, ringing in ears or voice changes. Denies cough or wheezing.  Denies shortness of breath. Denies chest pain or palpitations. Denies leg swelling. Denies abdominal distention, pain, blood in stools, constipation, diarrhea, nausea, vomiting, or early satiety. Denies pain with intercourse, dysuria, frequency, hematuria or incontinence. Denies hot flashes, pelvic pain, vaginal bleeding or vaginal discharge.   Denies joint pain, back pain or muscle pain/cramps. Denies itching, rash, or wounds. Denies dizziness, headaches, numbness or seizures. Denies swollen lymph nodes or glands, denies easy bruising or bleeding. Denies depression, confusion, or decreased concentration.  Physical Exam:  Vital Signs for this encounter:  Blood pressure 118/67, pulse 77, temperature 98.3 F (36.8 C), temperature source Oral, resp. rate 18, height 5' 3" (1.6 m), weight 140 lb 4.8 oz (63.6 kg), SpO2 100 %. Body mass index is 24.85 kg/m. General: Alert, oriented, no acute distress.  HEENT:  Normocephalic, atraumatic. Sclera anicteric.  Chest: Clear to auscultation bilaterally. No wheezes, rhonchi, or rales. Cardiovascular: Regular rate and rhythm, no murmurs, rubs, or gallops.  Abdomen: Normoactive bowel sounds. Soft, nondistended, nontender to palpation. No masses or hepatosplenomegaly appreciated. No palpable fluid wave. Fullness in lower abdomen. Well healed lsc and Pfannenstiel incisions. Extremities: Grossly normal range of motion. Warm, well perfused. No edema bilaterally.  Skin: No rashes or lesions.  Lymphatics: No cervical, supraclavicular, or inguinal adenopathy.  GU:  Normal external female genitalia.  No lesions. No discharge or bleeding.               Bladder/urethra:  No lesions or masses.             Vagina: Well rugated, no lesions.             Cervix/uterus: surgically absent.             Adnexa: 10-12 cm mass appreciated at the top of the vaginal cuff and anterior, span almost up to the umbilicus in the mid pelvis, moderately mobile. No nodularity.  Rectal: No involvement, can't appreciate the mass rectally  LABORATORY AND RADIOLOGIC DATA:  Outside medical records were reviewed to synthesize the above history, along with the history and physical obtained during the visit.   Lab Results  Component Value Date   WBC 8.8 12/31/2020   HGB 13.7 12/31/2020   HCT 40.0 12/31/2020   PLT 333 12/31/2020   GLUCOSE 101 (H) 02/13/2021   CHOL 228 (H) 12/31/2020   TRIG 107 12/31/2020   HDL 73 12/31/2020   LDLCALC 136 (H) 12/31/2020   ALT 15 12/31/2020   AST 17 12/31/2020   NA 137 02/13/2021   K 4.2 02/13/2021   CL 102 02/13/2021   CREATININE 0.68 02/13/2021   BUN 11 02/13/2021   CO2 26 02/13/2021   TSH 2.420 12/31/2020   17OH P - 342 Prolactin - 22.6 CA-125: 10.6 Estradiol - 2,590 LH - <0.3 FSH - <0.3  Pelvic ultrasound on 9/19: IMPRESSION: Surgical absence of uterus with what appear to be normal appearing ovaries bilaterally.   However, an 11.6 x 8.7 x  10.4 cm diameter complex solid mass with internal blood flow and scattered tiny cystic foci is identified within the pelvis, question of ovarian or non gynecologic origin; in the setting of elevated estrogen levels, this could represent a functional ovarian tumor such as a granulosa cell tumor or a thecoma.  

## 2021-02-13 NOTE — Patient Instructions (Signed)
Plan to have a CT scan before surgery and Dr. Berline Lopes will contact you with the results.    Preparing for your Surgery   Plan for surgery on March 03, 2021 with Dr. Jeral Pinch at [facility] will be scheduled for a robotic assisted laparoscopic bilateral salpingo-oophorectomy (removal of both ovaries and fallopian tubes), possible staging (taking biopsies, possibly sampling lymph nodes, removing omentum) if a cancer is identified, possible laparotomy (larger incision on your abdomen if needed).    Pre-operative Testing -You will receive a phone call from presurgical testing at [facility] to arrange for a pre-operative appointment and lab work.   -Bring your insurance card, copy of an advanced directive if applicable, medication list   -At that visit, you will be asked to sign a consent for a possible blood transfusion in case a transfusion becomes necessary during surgery.  The need for a blood transfusion is rare but having consent is a necessary part of your care.      -You should not be taking blood thinners or aspirin at least ten days prior to surgery unless instructed by your surgeon.   -Do not take supplements such as fish oil (omega 3), red yeast rice, turmeric before your surgery. You want to avoid medications with aspirin in them including headache powders such as BC or Goody's), Excedrin migraine.   Day Before Surgery at [facility] will be asked to take in a light diet the day before surgery. You will be advised you can have clear liquids up until 3 hours before your surgery.     Eat a light diet the day before surgery.  Examples including soups, broths, toast, yogurt, mashed potatoes.  AVOID GAS PRODUCING FOODS. Things to avoid include carbonated beverages (fizzy beverages, sodas), raw fruits and raw vegetables (uncooked), or beans.    If your bowels are filled with gas, your surgeon will have difficulty visualizing your pelvic organs which increases  your surgical risks.   Your role in recovery Your role is to become active as soon as directed by your doctor, while still giving yourself time to heal.  Rest when you feel tired. You will be asked to do the following in order to speed your recovery:   - Cough and breathe deeply. This helps to clear and expand your lungs and can prevent pneumonia after surgery.  - [place]. Do mild physical activity. Walking or moving your legs help your circulation and body functions return to normal. Do not try to get up or walk alone the first time after surgery.   -If you develop swelling on one leg or the other, pain in the back of your leg, redness/warmth in one of your legs, please call the office or go to the Emergency Room to have a doppler to rule out a blood clot. For shortness of breath, chest pain-seek care in the Emergency Room as soon as possible. - Actively manage your pain. Managing your pain lets you move in comfort. We will ask you to rate your pain on a scale of zero to 10. It is your responsibility to tell your doctor or nurse where and how much you hurt so your pain can be treated.   Special Considerations -If you are diabetic, you may be placed on insulin after surgery to have closer control over your blood sugars to promote healing and recovery.  This does not mean that you will be discharged on insulin.  If applicable,  your oral antidiabetics will be resumed when you are tolerating a solid diet.   -Your final pathology results from surgery should be available around one week after surgery and the results will be relayed to you when available.   -Dr. Lahoma Crocker is the surgeon that assists your GYN Oncologist with surgery.  If you end up staying the night, the next day after your surgery you will either see Dr. Denman George, Dr. Berline Lopes, or Dr. Lahoma Crocker.   -FMLA forms can be faxed to [phone] and please allow 5-7 business days for completion.   Pain  Management After Surgery -You have been prescribed your pain medication and bowel regimen medications before surgery so that you can have these available when you are discharged from the hospital. The pain medication is for use ONLY AFTER surgery and a new prescription will not be given.    -Make sure that you have Tylenol and Ibuprofen at home to use on a regular basis after surgery for pain control. We recommend alternating the medications every hour to six hours since they work differently and are processed in the body differently for pain relief.   -Review the attached handout on narcotic use and their risks and side effects.    Bowel Regimen -You have been prescribed Sennakot-S to take nightly to prevent constipation especially if you are taking the narcotic pain medication intermittently.  It is important to prevent constipation and drink adequate amounts of liquids. You can stop taking this medication when you are not taking pain medication and you are back on your normal bowel routine.   Risks of Surgery Risks of surgery are low but include bleeding, infection, damage to surrounding structures, re-operation, blood clots, and very rarely death.     Blood Transfusion Information (For the consent to be signed before surgery)   We will be checking your blood type before surgery so in case of emergencies, we will know what type of blood you would need.                                             WHAT IS A BLOOD TRANSFUSION?   A transfusion is the replacement of blood or some of its parts. Blood is made up of multiple cells which provide different functions. Red blood cells carry oxygen and are used for blood loss replacement. White blood cells fight against infection. Platelets control bleeding. Plasma helps clot blood. Other blood products are available for specialized needs, such as hemophilia or other clotting disorders. BEFORE THE TRANSFUSION  Who gives blood for transfusions?  You  may be able to donate blood to be used at a later date on yourself (autologous donation). Relatives can be asked to donate blood. This is generally not any safer than if you have received blood from a stranger. The same precautions are taken to ensure safety when a relative's blood is donated. Healthy volunteers who are fully evaluated to make sure their blood is safe. This is blood bank blood. Transfusion therapy is the safest it has ever been in the practice of medicine. Before blood is taken from a donor, a complete history is taken to make sure that person has no history of diseases nor engages in risky social behavior (examples are intravenous drug use or sexual activity with multiple partners). The donor's travel history is screened to minimize risk of transmitting infections,  such as malaria. The donated blood is tested for signs of infectious diseases, such as HIV and hepatitis. The blood is then tested to be sure it is compatible with you in order to minimize the chance of a transfusion reaction. If you or a relative donates blood, this is often done in anticipation of surgery and is not appropriate for emergency situations. It takes many days to process the donated blood. RISKS AND COMPLICATIONS Although transfusion therapy is very safe and saves many lives, the main dangers of transfusion include:  Getting an infectious disease. Developing a transfusion reaction. This is an allergic reaction to something in the blood you were given. Every precaution is taken to prevent this. The decision to have a blood transfusion has been considered carefully by your caregiver before blood is given. Blood is not given unless the benefits outweigh the risks.   AFTER SURGERY INSTRUCTIONS   Return to work: 4-6 weeks if applicable   Activity: 1. Be up and out of the bed during the day.  Take a nap if needed.  You may walk up steps but be careful and use the hand rail.  Stair climbing will tire you more than  you think, you may need to stop part way and rest.    2. No lifting or straining for 6 weeks over 10 pounds. No pushing, pulling, straining for 6 weeks.   3. No driving for 1 week(s).  Do not drive if you are taking narcotic pain medicine and make sure that your reaction time has returned.    4. You can shower as soon as the next day after surgery. Shower daily.  Use your regular soap and water (not directly on the incision) and pat your incision(s) dry afterwards; don't rub.  No tub baths or submerging your body in water until cleared by your surgeon. If you have the soap that was given to you by pre-surgical testing that was used before surgery, you do not need to use it afterwards because this can irritate your incisions.    5. No sexual activity and nothing in the vagina for 4 weeks.   6. You may experience a small amount of clear drainage from your incisions, which is normal.  If the drainage persists, increases, or changes color please call the office.   7. Do not use creams, lotions, or ointments such as neosporin on your incisions after surgery until advised by your surgeon because they can cause removal of the dermabond glue on your incisions.     8. Take Tylenol or ibuprofen first for pain and only use narcotic pain medication for severe pain not relieved by the Tylenol or Ibuprofen.  Monitor your Tylenol intake to a max of 4,000 mg in a 24 hour period. You can alternate these medications after surgery.   Diet: 1. Low sodium Heart Healthy Diet is recommended but you are cleared to resume your normal (before surgery) diet after your procedure.   2. It is safe to use a laxative, such as Miralax or Colace, if you have difficulty moving your bowels. You have been prescribed Sennakot at bedtime every evening to keep bowel movements regular and to prevent constipation.     Wound Care: 1. Keep clean and dry.  Shower daily.   Reasons to call the Doctor: Fever - Oral temperature greater  than 100.4 degrees Fahrenheit Foul-smelling vaginal discharge Difficulty urinating Nausea and vomiting Increased pain at the site of the incision that is unrelieved with pain medicine. Difficulty  breathing with or without chest pain New calf pain especially if only on one side Sudden, continuing increased vaginal bleeding with or without clots.   Contacts: For questions or concerns you should contact:   Dr. Jeral Pinch at [phone]   Joylene John, NP at [phone]   After Hours: call [phone] and have the GYN Oncologist paged/contacted (after 5 pm or on the weekends).   Messages sent via mychart are for non-urgent matters and are not responded to after hours so for urgent needs, please call the after hours number.

## 2021-02-13 NOTE — Patient Instructions (Addendum)
Plan to have a CT scan before surgery and Dr. Berline Lopes will contact you with the results.   Preparing for your Surgery  Plan for surgery on March 03, 2021 with Dr. Jeral Pinch at [facility] will be scheduled for a robotic assisted laparoscopic bilateral salpingo-oophorectomy (removal of both ovaries and fallopian tubes), possible staging (taking biopsies, possibly sampling lymph nodes, removing omentum) if a cancer is identified, possible laparotomy (larger incision on your abdomen if needed).   Pre-operative Testing -You will receive a phone call from presurgical testing at [facility] to arrange for a pre-operative appointment and lab work.  -Bring your insurance card, copy of an advanced directive if applicable, medication list  -At that visit, you will be asked to sign a consent for a possible blood transfusion in case a transfusion becomes necessary during surgery.  The need for a blood transfusion is rare but having consent is a necessary part of your care.     -You should not be taking blood thinners or aspirin at least ten days prior to surgery unless instructed by your surgeon.  -Do not take supplements such as fish oil (omega 3), red yeast rice, turmeric before your surgery. You want to avoid medications with aspirin in them including headache powders such as BC or Goody's), Excedrin migraine.  Day Before Surgery at [facility] will be asked to take in a light diet the day before surgery. You will be advised you can have clear liquids up until 3 hours before your surgery.    Eat a light diet the day before surgery.  Examples including soups, broths, toast, yogurt, mashed potatoes.  AVOID GAS PRODUCING FOODS. Things to avoid include carbonated beverages (fizzy beverages, sodas), raw fruits and raw vegetables (uncooked), or beans.   If your bowels are filled with gas, your surgeon will have difficulty visualizing your pelvic organs which increases your  surgical risks.  Your role in recovery Your role is to become active as soon as directed by your doctor, while still giving yourself time to heal.  Rest when you feel tired. You will be asked to do the following in order to speed your recovery:  - Cough and breathe deeply. This helps to clear and expand your lungs and can prevent pneumonia after surgery.  - [place]. Do mild physical activity. Walking or moving your legs help your circulation and body functions return to normal. Do not try to get up or walk alone the first time after surgery.   -If you develop swelling on one leg or the other, pain in the back of your leg, redness/warmth in one of your legs, please call the office or go to the Emergency Room to have a doppler to rule out a blood clot. For shortness of breath, chest pain-seek care in the Emergency Room as soon as possible. - Actively manage your pain. Managing your pain lets you move in comfort. We will ask you to rate your pain on a scale of zero to 10. It is your responsibility to tell your doctor or nurse where and how much you hurt so your pain can be treated.  Special Considerations -If you are diabetic, you may be placed on insulin after surgery to have closer control over your blood sugars to promote healing and recovery.  This does not mean that you will be discharged on insulin.  If applicable, your oral antidiabetics will be resumed when you are tolerating a solid diet.  -  Your final pathology results from surgery should be available around one week after surgery and the results will be relayed to you when available.  -Dr. Lahoma Crocker is the surgeon that assists your GYN Oncologist with surgery.  If you end up staying the night, the next day after your surgery you will either see Dr. Denman George, Dr. Berline Lopes, or Dr. Lahoma Crocker.  -FMLA forms can be faxed to [phone] and please allow 5-7 business days for completion.  Pain Management After  Surgery -You have been prescribed your pain medication and bowel regimen medications before surgery so that you can have these available when you are discharged from the hospital. The pain medication is for use ONLY AFTER surgery and a new prescription will not be given.   -Make sure that you have Tylenol and Ibuprofen at home to use on a regular basis after surgery for pain control. We recommend alternating the medications every hour to six hours since they work differently and are processed in the body differently for pain relief.  -Review the attached handout on narcotic use and their risks and side effects.   Bowel Regimen -You have been prescribed Sennakot-S to take nightly to prevent constipation especially if you are taking the narcotic pain medication intermittently.  It is important to prevent constipation and drink adequate amounts of liquids. You can stop taking this medication when you are not taking pain medication and you are back on your normal bowel routine.  Risks of Surgery Risks of surgery are low but include bleeding, infection, damage to surrounding structures, re-operation, blood clots, and very rarely death.   Blood Transfusion Information (For the consent to be signed before surgery)  We will be checking your blood type before surgery so in case of emergencies, we will know what type of blood you would need.                                            WHAT IS A BLOOD TRANSFUSION?  A transfusion is the replacement of blood or some of its parts. Blood is made up of multiple cells which provide different functions. Red blood cells carry oxygen and are used for blood loss replacement. White blood cells fight against infection. Platelets control bleeding. Plasma helps clot blood. Other blood products are available for specialized needs, such as hemophilia or other clotting disorders. BEFORE THE TRANSFUSION  Who gives blood for transfusions?  You may be able to donate  blood to be used at a later date on yourself (autologous donation). Relatives can be asked to donate blood. This is generally not any safer than if you have received blood from a stranger. The same precautions are taken to ensure safety when a relative's blood is donated. Healthy volunteers who are fully evaluated to make sure their blood is safe. This is blood bank blood. Transfusion therapy is the safest it has ever been in the practice of medicine. Before blood is taken from a donor, a complete history is taken to make sure that person has no history of diseases nor engages in risky social behavior (examples are intravenous drug use or sexual activity with multiple partners). The donor's travel history is screened to minimize risk of transmitting infections, such as malaria. The donated blood is tested for signs of infectious diseases, such as HIV and hepatitis. The blood is then tested to be sure it  is compatible with you in order to minimize the chance of a transfusion reaction. If you or a relative donates blood, this is often done in anticipation of surgery and is not appropriate for emergency situations. It takes many days to process the donated blood. RISKS AND COMPLICATIONS Although transfusion therapy is very safe and saves many lives, the main dangers of transfusion include:  Getting an infectious disease. Developing a transfusion reaction. This is an allergic reaction to something in the blood you were given. Every precaution is taken to prevent this. The decision to have a blood transfusion has been considered carefully by your caregiver before blood is given. Blood is not given unless the benefits outweigh the risks.  AFTER SURGERY INSTRUCTIONS  Return to work: 4-6 weeks if applicable  Activity: 1. Be up and out of the bed during the day.  Take a nap if needed.  You may walk up steps but be careful and use the hand rail.  Stair climbing will tire you more than you think, you may need to  stop part way and rest.   2. No lifting or straining for 6 weeks over 10 pounds. No pushing, pulling, straining for 6 weeks.  3. No driving for 1 week(s).  Do not drive if you are taking narcotic pain medicine and make sure that your reaction time has returned.   4. You can shower as soon as the next day after surgery. Shower daily.  Use your regular soap and water (not directly on the incision) and pat your incision(s) dry afterwards; don't rub.  No tub baths or submerging your body in water until cleared by your surgeon. If you have the soap that was given to you by pre-surgical testing that was used before surgery, you do not need to use it afterwards because this can irritate your incisions.   5. No sexual activity and nothing in the vagina for 4 weeks.  6. You may experience a small amount of clear drainage from your incisions, which is normal.  If the drainage persists, increases, or changes color please call the office.  7. Do not use creams, lotions, or ointments such as neosporin on your incisions after surgery until advised by your surgeon because they can cause removal of the dermabond glue on your incisions.    8. Take Tylenol or ibuprofen first for pain and only use narcotic pain medication for severe pain not relieved by the Tylenol or Ibuprofen.  Monitor your Tylenol intake to a max of 4,000 mg in a 24 hour period. You can alternate these medications after surgery.  Diet: 1. Low sodium Heart Healthy Diet is recommended but you are cleared to resume your normal (before surgery) diet after your procedure.  2. It is safe to use a laxative, such as Miralax or Colace, if you have difficulty moving your bowels. You have been prescribed Sennakot at bedtime every evening to keep bowel movements regular and to prevent constipation.    Wound Care: 1. Keep clean and dry.  Shower daily.  Reasons to call the Doctor: Fever - Oral temperature greater than 100.4 degrees  Fahrenheit Foul-smelling vaginal discharge Difficulty urinating Nausea and vomiting Increased pain at the site of the incision that is unrelieved with pain medicine. Difficulty breathing with or without chest pain New calf pain especially if only on one side Sudden, continuing increased vaginal bleeding with or without clots.   Contacts: For questions or concerns you should contact:  Dr. Jeral Pinch at [phone]  Joylene John, NP at [phone]  After Hours: call [phone] and have the GYN Oncologist paged/contacted (after 5 pm or on the weekends).  Messages sent via mychart are for non-urgent matters and are not responded to after hours so for urgent needs, please call the after hours number.

## 2021-02-13 NOTE — Progress Notes (Signed)
Patient here with her daughter for new patient consultation with Dr. Jeral Pinch and for a pre-operative discussion prior to her scheduled surgery on March 03, 2021. She is scheduled for robotic assisted laparoscopic bilateral salpingo-oophorectomy, possible staging if a cancer is identified, possible laparotomy. The surgery was discussed in detail.  See after visit summary for additional details. Visual aids used to discuss items related to surgery including sequential compression stockings, foley catheter, IV pump, multi-modal pain regimen including tylenol, photo of the surgical robot, female reproductive system to discuss surgery in detail.      Discussed post-op pain management in detail including the aspects of the enhanced recovery pathway.  Advised her that a new prescription would be sent in for tramadol and it is only to be used for after her upcoming surgery.  We discussed the use of tylenol post-op and to monitor for a maximum of 4,000 mg in a 24 hour period.  Also prescribed sennakot to be used after surgery and to hold if having loose stools.  Discussed bowel regimen in detail.     Discussed the use of SCDs and measures to take at home to prevent DVT including frequent mobility.  Reportable signs and symptoms of DVT discussed. Post-operative instructions discussed and expectations for after surgery. Incisional care discussed as well including reportable signs and symptoms including erythema, drainage, wound separation.     10 minutes spent with the patient.  Verbalizing understanding of material discussed. No needs or concerns voiced at the end of the visit.   Advised patient and family to call for any needs.  Advised that her post-operative medications had been prescribed and could be picked up at any time. Instructions for CT scan discussed and contrast given. Patient escorted to lab at the end of the visit.   This appointment is included in the global surgical bundle as pre-operative  teaching and has no charge.

## 2021-02-16 ENCOUNTER — Ambulatory Visit: Payer: BC Managed Care – PPO

## 2021-02-16 ENCOUNTER — Ambulatory Visit: Payer: BC Managed Care – PPO | Admitting: Anesthesiology

## 2021-02-16 ENCOUNTER — Ambulatory Visit
Admission: RE | Admit: 2021-02-16 | Discharge: 2021-02-16 | Disposition: A | Discharge status: Home / Self Care | Payer: BC Managed Care – PPO | Attending: Gastroenterology | Admitting: Gastroenterology

## 2021-02-16 ENCOUNTER — Encounter
Admission: RE | Disposition: A | Discharge status: Home / Self Care | Payer: Self-pay | Source: Home / Self Care | Attending: Gastroenterology

## 2021-02-16 ENCOUNTER — Encounter: Payer: Self-pay | Admitting: Gastroenterology

## 2021-02-16 DIAGNOSIS — I10 Essential (primary) hypertension: Secondary | ICD-10-CM | POA: Diagnosis not present

## 2021-02-16 DIAGNOSIS — Z91013 Allergy to seafood: Secondary | ICD-10-CM | POA: Diagnosis not present

## 2021-02-16 DIAGNOSIS — K644 Residual hemorrhoidal skin tags: Secondary | ICD-10-CM | POA: Diagnosis not present

## 2021-02-16 DIAGNOSIS — Z800 Family history of malignant neoplasm of digestive organs: Secondary | ICD-10-CM | POA: Diagnosis not present

## 2021-02-16 DIAGNOSIS — Z1211 Encounter for screening for malignant neoplasm of colon: Secondary | ICD-10-CM | POA: Diagnosis not present

## 2021-02-16 DIAGNOSIS — Z888 Allergy status to other drugs, medicaments and biological substances status: Secondary | ICD-10-CM | POA: Diagnosis not present

## 2021-02-16 DIAGNOSIS — Z79899 Other long term (current) drug therapy: Secondary | ICD-10-CM | POA: Diagnosis not present

## 2021-02-16 DIAGNOSIS — Z91030 Bee allergy status: Secondary | ICD-10-CM | POA: Diagnosis not present

## 2021-02-16 HISTORY — PX: COLONOSCOPY WITH PROPOFOL: SHX5780

## 2021-02-16 SURGERY — COLONOSCOPY WITH PROPOFOL
Anesthesia: General

## 2021-02-16 MED ORDER — LIDOCAINE HCL (CARDIAC) PF 100 MG/5ML IV SOSY
PREFILLED_SYRINGE | INTRAVENOUS | Status: DC | PRN
  Administered 2021-02-16: 50 mg via INTRAVENOUS

## 2021-02-16 MED ORDER — PROPOFOL 10 MG/ML IV BOLUS
INTRAVENOUS | Status: DC | PRN
  Administered 2021-02-16: 80 mg via INTRAVENOUS

## 2021-02-16 MED ORDER — PHENYLEPHRINE HCL (PRESSORS) 10 MG/ML IV SOLN
INTRAVENOUS | Status: DC | PRN
  Administered 2021-02-16: 100 ug via INTRAVENOUS

## 2021-02-16 MED ORDER — SODIUM CHLORIDE 0.9 % IV SOLN: INTRAVENOUS | Status: DC

## 2021-02-16 MED ORDER — PROPOFOL 500 MG/50ML IV EMUL
INTRAVENOUS | Status: DC | PRN
  Administered 2021-02-16: 150 ug/kg/min via INTRAVENOUS

## 2021-02-16 NOTE — Op Note (Signed)
[facility] Gastroenterology Patient Name: Lisa Osborn Procedure Date: 02/16/2021 10:42 AM MRN: [id number] Account #: [id number] Date of Birth: [birth year] Admit Type: Outpatient Age: 55 Room: [facility] ENDO ROOM 1 Gender: Female Note Status: Finalized Instrument Name: Park Meo [id number] Procedure:             Colonoscopy Indications:           Screening for colorectal malignant neoplasm, This is                         the patient's first colonoscopy Providers:             Lin Landsman MD, MD Referring MD:          Jonetta Osgood (Referring MD) Medicines:             General Anesthesia Complications:         No immediate complications. Estimated blood loss: None. Procedure:             Pre-Anesthesia Assessment:                        - Prior to the procedure, a History and Physical was                         performed, and patient medications and allergies were                         reviewed. The patient is competent. The risks and                         benefits of the procedure and the sedation options and                         risks were discussed with the patient. All questions                         were answered and informed consent was obtained.                         Patient identification and proposed procedure were                         verified by the physician, the nurse, the                         anesthesiologist, the anesthetist and the technician                         in the pre-procedure area in the procedure room in the                         endoscopy suite. Mental Status Examination: alert and                         oriented. Airway Examination: normal oropharyngeal                         airway and neck mobility. Respiratory Examination:  clear to auscultation. CV Examination: normal.                         Prophylactic Antibiotics: The patient does not require                         prophylactic  antibiotics. Prior Anticoagulants: The                         patient has taken no previous anticoagulant or                         antiplatelet agents. ASA Grade Assessment: II - A                         patient with mild systemic disease. After reviewing                         the risks and benefits, the patient was deemed in                         satisfactory condition to undergo the procedure. The                         anesthesia plan was to use general anesthesia.                         Immediately prior to administration of medications,                         the patient was re-assessed for adequacy to receive                         sedatives. The heart rate, respiratory rate, oxygen                         saturations, blood pressure, adequacy of pulmonary                         ventilation, and response to care were monitored                         throughout the procedure. The physical status of the                         patient was re-assessed after the procedure.                        After obtaining informed consent, the colonoscope was                         passed under direct vision. Throughout the procedure,                         the patient's blood pressure, pulse, and oxygen                         saturations were monitored continuously. The  Colonoscope was introduced through the anus and                         advanced to the the cecum, identified by appendiceal                         orifice and ileocecal valve. The colonoscopy was                         performed without difficulty. The patient tolerated                         the procedure well. The quality of the bowel                         preparation was evaluated using the BBPS [facility] Bowel                         Preparation Scale) with scores of: Right Colon = 3,                         Transverse Colon = 3 and Left Colon = 3 (entire mucosa                          seen well with no residual staining, small fragments                         of stool or opaque liquid). The total BBPS score                         equals 9. Findings:      The perianal and digital rectal examinations were normal. Pertinent       negatives include normal sphincter tone and no palpable rectal lesions.      The entire examined colon appeared normal.      Non-bleeding external hemorrhoids were found during retroflexion. The       hemorrhoids were medium-sized. Impression:            - The entire examined colon is normal.                        - Non-bleeding external hemorrhoids.                        - No specimens collected. Recommendation:        - Discharge patient to home (with escort).                        - Resume previous diet today.                        - Continue present medications.                        - Repeat colonoscopy in 10 years for screening                         purposes. Procedure Code(s):     --- Professional ---  B8685, Colorectal cancer screening; colonoscopy on                         individual not meeting criteria for high risk Diagnosis Code(s):     --- Professional ---                        Z12.11, Encounter for screening for malignant neoplasm                         of colon                        K64.4, Residual hemorrhoidal skin tags CPT copyright 2019 American Medical Association. All rights reserved. The codes documented in this report are preliminary and upon coder review may  be revised to meet current compliance requirements. Dr. Ulyess Mort Lin Landsman MD, MD 02/16/2021 11:04:01 AM This report has been signed electronically. Number of Addenda: 0 Note Initiated On: 02/16/2021 10:42 AM Scope Withdrawal Time: 0 hours 9 minutes 36 seconds  Total Procedure Duration: 0 hours 13 minutes 19 seconds  Estimated Blood Loss:  Estimated blood loss: none.      [facility]

## 2021-02-16 NOTE — Transfer of Care (Signed)
Immediate Anesthesia Transfer of Care Note  Patient: Lisa Osborn  Procedure(s) Performed: COLONOSCOPY WITH PROPOFOL  Patient Location: PACU  Anesthesia Type:General  Level of Consciousness: awake, alert  and oriented  Airway & Oxygen Therapy: Patient Spontanous Breathing  Post-op Assessment: Report given to RN and Post -op Vital signs reviewed and stable  Post vital signs: Reviewed and stable  Last Vitals:  Vitals Value Taken Time  BP 98/60 02/16/21 1107  Temp 35.7 C 02/16/21 1106  Pulse 77 02/16/21 1107  Resp 14 02/16/21 1107  SpO2 100 % 02/16/21 1107    Last Pain:  Vitals:   02/16/21 1106  TempSrc: Tympanic  PainSc: Asleep         Complications: No notable events documented.

## 2021-02-16 NOTE — H&P (Signed)
Cephas Darby, MD [address]  [place]  [place], [place] [address]  Main: [phone]  Fax: [phone] Pager: [phone]  Primary Care Physician:  Jonetta Osgood, NP Primary Gastroenterologist:  Dr. Cephas Darby  Pre-Procedure History & Physical: HPI:  Lisa Osborn is a 55 y.o. female is here for an colonoscopy.   Past Medical History:  Diagnosis Date   Allergy    Anxiety    Depression    Gout    HLD (hyperlipidemia)    Hypertension     Past Surgical History:  Procedure Laterality Date   CESAREAN SECTION     CHOLECYSTECTOMY     NOSE SURGERY     Surgery for deviated septum   OTHER SURGICAL HISTORY     Mesh placement for bladder prolapse versus urinary incontinence   VAGINAL HYSTERECTOMY      Prior to Admission medications   Medication Sig Start Date End Date Taking? Authorizing Provider  amLODipine (NORVASC) 10 MG tablet Take 1 tablet (10 mg total) by mouth daily. 12/31/20  Yes Abernathy, Yetta Flock, NP  hydrochlorothiazide (HYDRODIURIL) 12.5 MG tablet Take 1 tablet (12.5 mg total) by mouth daily. 12/31/20  Yes Abernathy, Yetta Flock, NP  ibuprofen (ADVIL) 800 MG tablet Take 1 tablet (800 mg total) by mouth every 8 (eight) hours as needed for moderate pain. For AFTER surgery only 02/13/21  Yes Cross, Melissa D, NP  zolpidem (AMBIEN) 10 MG tablet Take 1 tablet (10 mg total) by mouth at bedtime as needed for sleep. 01/05/21  Yes Abernathy, Yetta Flock, NP  CLENPIQ 10-3.5-12 MG-GM -GM/160ML SOLN SMARTSIG:320 Milliliter(s) By Mouth Once 01/21/21   [provider]  EPINEPHrine 0.3 mg/0.3 mL IJ SOAJ injection Inject into the muscle once.    [provider]  senna-docusate (SENOKOT-S) 8.6-50 MG tablet Take 2 tablets by mouth at bedtime. For AFTER surgery, do not take if having diarrhea 02/13/21   Joylene John D, NP  traMADol (ULTRAM) 50 MG tablet Take 1 tablet (50 mg total) by mouth every 6 (six) hours as needed for severe pain. For AFTER surgery only, do not  take and drive 9/70/26   Joylene John D, NP  venlafaxine XR (EFFEXOR-XR) 37.5 MG 24 hr capsule TAKE 1 CAPSULE BY MOUTH DAILY WITH BREAKFAST. 01/22/21   Jonetta Osgood, NP    Allergies as of 01/21/2021 - Review Complete 01/19/2021  Allergen Reaction Noted   Bee venom Anaphylaxis 01/16/2016   Shellfish allergy Anaphylaxis 01/16/2016   Compazine  [prochlorperazine edisylate]  10/18/2017   Codeine Itching and Rash 01/16/2016    Family History  Problem Relation Age of Onset   Breast cancer Mother    Hyperlipidemia Mother    Cancer Father        RCC, prostate, skin   Hyperlipidemia Father    Hypertension Father    Brain cancer Brother    Breast cancer Maternal Grandmother    Breast cancer Paternal Aunt    Breast cancer Cousin        pat cousin   Pancreatic cancer Paternal Uncle    Gastric cancer Paternal Uncle     Social History   Socioeconomic History   Marital status: Married    Spouse name: Not on file   Number of children: Not on file   Years of education: Not on file   Highest education level: Not on file  Occupational History   Not on file  Tobacco Use   Smoking status: Never   Smokeless tobacco: Never  Vaping  Use   Vaping Use: Never used  Substance and Sexual Activity   Alcohol use: Yes    Comment: ocassionally   Drug use: Never   Sexual activity: Not on file  Other Topics Concern   Not on file  Social History Narrative   Not on file   Social Determinants of Health   Financial Resource Strain: Not on file  Food Insecurity: Not on file  Transportation Needs: Not on file  Physical Activity: Not on file  Stress: Not on file  Social Connections: Not on file  Intimate Partner Violence: Not on file    Review of Systems: See HPI, otherwise negative ROS  Physical Exam: BP 120/72   Pulse 71   Temp (!) 97 F (36.1 C) (Temporal)   Resp 16   Ht 5\' 3"  (1.6 m)   Wt 61.2 kg   SpO2 100%   BMI 23.91 kg/m  General:   Alert,  pleasant and cooperative in  NAD Head:  Normocephalic and atraumatic. Neck:  Supple; no masses or thyromegaly. Lungs:  Clear throughout to auscultation.    Heart:  Regular rate and rhythm. Abdomen:  Soft, nontender and nondistended. Normal bowel sounds, without guarding, and without rebound.   Neurologic:  Alert and  oriented x4;  grossly normal neurologically.  Impression/Plan: Lisa Osborn is here for an colonoscopy to be performed for colon cancer screening  Risks, benefits, limitations, and alternatives regarding  colonoscopy have been reviewed with the patient.  Questions have been answered.  All parties agreeable.   Sherri Sear, MD  02/16/2021, 10:33 AM

## 2021-02-16 NOTE — Anesthesia Postprocedure Evaluation (Signed)
Anesthesia Post Note  Patient: Lisa Osborn  Procedure(s) Performed: COLONOSCOPY WITH PROPOFOL  Patient location during evaluation: PACU Anesthesia Type: General Level of consciousness: awake and alert Pain management: pain level controlled Vital Signs Assessment: post-procedure vital signs reviewed and stable Respiratory status: spontaneous breathing, nonlabored ventilation, respiratory function stable and patient connected to nasal cannula oxygen Cardiovascular status: blood pressure returned to baseline and stable Postop Assessment: no apparent nausea or vomiting Anesthetic complications: no   No notable events documented.   Last Vitals:  Vitals:   02/16/21 1107 02/16/21 1116  BP: 98/60 114/69  Pulse: 77 74  Resp: 14 17  Temp:    SpO2: 100% 100%    Last Pain:  Vitals:   02/16/21 1116  TempSrc:   PainSc: 0-No pain                 Molli Barrows

## 2021-02-16 NOTE — Anesthesia Procedure Notes (Signed)
Date/Time: 02/16/2021 10:42 AM Performed by: Johnna Acosta, CRNA Pre-anesthesia Checklist: Patient identified, Emergency Drugs available, Suction available, Patient being monitored and Timeout performed Patient Re-evaluated:Patient Re-evaluated prior to induction Oxygen Delivery Method: Nasal cannula Preoxygenation: Pre-oxygenation with 100% oxygen Induction Type: IV induction

## 2021-02-16 NOTE — Anesthesia Preprocedure Evaluation (Signed)
Anesthesia Evaluation  Patient identified by MRN, date of birth, ID band Patient awake    Reviewed: Allergy & Precautions, H&P , NPO status , Patient's Chart, lab work & pertinent test results, reviewed documented beta blocker date and time   Airway Mallampati: II   Neck ROM: full    Dental  (+) Poor Dentition   Pulmonary neg pulmonary ROS,    Pulmonary exam normal        Cardiovascular Exercise Tolerance: Good hypertension, On Medications negative cardio ROS Normal cardiovascular exam Rhythm:regular Rate:Normal     Neuro/Psych  Headaches, PSYCHIATRIC DISORDERS Anxiety Depression    GI/Hepatic negative GI ROS, Neg liver ROS,   Endo/Other  negative endocrine ROS  Renal/GU negative Renal ROS  negative genitourinary   Musculoskeletal   Abdominal   Peds  Hematology negative hematology ROS (+)   Anesthesia Other Findings Past Medical History: No date: Allergy No date: Anxiety No date: Depression No date: Gout No date: HLD (hyperlipidemia) No date: Hypertension Past Surgical History: No date: CESAREAN SECTION No date: CHOLECYSTECTOMY No date: NOSE SURGERY     Comment:  Surgery for deviated septum No date: OTHER SURGICAL HISTORY     Comment:  Mesh placement for bladder prolapse versus urinary               incontinence No date: VAGINAL HYSTERECTOMY   Reproductive/Obstetrics negative OB ROS                             Anesthesia Physical Anesthesia Plan  ASA: 2  Anesthesia Plan: General   Post-op Pain Management:    Induction:   PONV Risk Score and Plan:   Airway Management Planned:   Additional Equipment:   Intra-op Plan:   Post-operative Plan:   Informed Consent: I have reviewed the patients History and Physical, chart, labs and discussed the procedure including the risks, benefits and alternatives for the proposed anesthesia with the patient or authorized  representative who has indicated his/her understanding and acceptance.     Dental Advisory Given  Plan Discussed with: CRNA  Anesthesia Plan Comments:         Anesthesia Quick Evaluation

## 2021-02-17 ENCOUNTER — Encounter: Payer: Self-pay | Admitting: Gastroenterology

## 2021-02-17 ENCOUNTER — Other Ambulatory Visit: Payer: Self-pay

## 2021-02-17 ENCOUNTER — Ambulatory Visit
Admission: RE | Admit: 2021-02-17 | Discharge: 2021-02-17 | Disposition: A | Discharge status: Home / Self Care | Payer: BC Managed Care – PPO | Source: Ambulatory Visit | Attending: Gynecologic Oncology | Admitting: Gynecologic Oncology

## 2021-02-17 DIAGNOSIS — R1900 Intra-abdominal and pelvic swelling, mass and lump, unspecified site: Secondary | ICD-10-CM | POA: Insufficient documentation

## 2021-02-17 MED ORDER — IOHEXOL 350 MG/ML SOLN
75.0000 mL | Freq: Once | INTRAVENOUS | Status: AC | PRN
  Administered 2021-02-17: 75 mL via INTRAVENOUS

## 2021-02-18 LAB — INHIBIN B: Inhibin B: 6282.4 pg/mL — ABNORMAL HIGH (ref 0.0–16.9)

## 2021-02-24 ENCOUNTER — Encounter (HOSPITAL_COMMUNITY)
Admit: 2021-02-24 | Discharge: 2021-02-24 | Disposition: A | Discharge status: Home / Self Care | Payer: BC Managed Care – PPO | Source: Ambulatory Visit | Attending: Gynecologic Oncology | Admitting: Gynecologic Oncology

## 2021-02-24 ENCOUNTER — Encounter (HOSPITAL_COMMUNITY): Payer: Self-pay

## 2021-02-24 ENCOUNTER — Ambulatory Visit (HOSPITAL_COMMUNITY): Payer: BC Managed Care – PPO

## 2021-02-24 ENCOUNTER — Other Ambulatory Visit: Payer: Self-pay

## 2021-02-24 ENCOUNTER — Inpatient Hospital Stay: Payer: BC Managed Care – PPO

## 2021-02-24 ENCOUNTER — Other Ambulatory Visit: Payer: Self-pay | Admitting: Genetic Counselor

## 2021-02-24 ENCOUNTER — Inpatient Hospital Stay: Payer: BC Managed Care – PPO | Attending: Gynecologic Oncology | Admitting: Genetic Counselor

## 2021-02-24 DIAGNOSIS — R1909 Other intra-abdominal and pelvic swelling, mass and lump: Secondary | ICD-10-CM | POA: Diagnosis not present

## 2021-02-24 DIAGNOSIS — Z01818 Encounter for other preprocedural examination: Secondary | ICD-10-CM | POA: Diagnosis present

## 2021-02-24 DIAGNOSIS — Z803 Family history of malignant neoplasm of breast: Secondary | ICD-10-CM | POA: Diagnosis not present

## 2021-02-24 DIAGNOSIS — Z800 Family history of malignant neoplasm of digestive organs: Secondary | ICD-10-CM

## 2021-02-24 DIAGNOSIS — Z1379 Encounter for other screening for genetic and chromosomal anomalies: Secondary | ICD-10-CM

## 2021-02-24 DIAGNOSIS — Z8042 Family history of malignant neoplasm of prostate: Secondary | ICD-10-CM | POA: Diagnosis not present

## 2021-02-24 LAB — CBC
HCT: 38.9 % (ref 36.0–46.0)
Hemoglobin: 12.9 g/dL (ref 12.0–15.0)
MCH: 30.1 pg (ref 26.0–34.0)
MCHC: 33.2 g/dL (ref 30.0–36.0)
MCV: 90.9 fL (ref 80.0–100.0)
Platelets: 387 10*3/uL (ref 150–400)
RBC: 4.28 MIL/uL (ref 3.87–5.11)
RDW: 12.6 % (ref 11.5–15.5)
WBC: 7.8 10*3/uL (ref 4.0–10.5)
nRBC: 0 % (ref 0.0–0.2)

## 2021-02-24 LAB — URINALYSIS, ROUTINE W REFLEX MICROSCOPIC
Bilirubin Urine: NEGATIVE
Glucose, UA: NEGATIVE mg/dL
Hgb urine dipstick: NEGATIVE
Ketones, ur: NEGATIVE mg/dL
Leukocytes,Ua: NEGATIVE
Nitrite: NEGATIVE
Protein, ur: NEGATIVE mg/dL
Specific Gravity, Urine: 1.024 (ref 1.005–1.030)
pH: 5 (ref 5.0–8.0)

## 2021-02-24 LAB — BASIC METABOLIC PANEL
Anion gap: 7 (ref 5–15)
BUN: 13 mg/dL (ref 6–20)
CO2: 27 mmol/L (ref 22–32)
Calcium: 9.8 mg/dL (ref 8.9–10.3)
Chloride: 107 mmol/L (ref 98–111)
Creatinine, Ser: 0.61 mg/dL (ref 0.44–1.00)
GFR, Estimated: >60 mL/min (ref >60)
Glucose, Bld: 90 mg/dL (ref 70–99)
Potassium: 3.5 mmol/L (ref 3.5–5.1)
Sodium: 141 mmol/L (ref 135–145)

## 2021-02-24 LAB — GENETIC SCREENING ORDER

## 2021-02-24 NOTE — Progress Notes (Signed)
REFERRING PROVIDER: Lafonda Mosses, MD [place], [place] [address]  PRIMARY PROVIDER:  Jonetta Osgood, NP  PRIMARY REASON FOR VISIT:  Encounter Diagnoses  Name Primary?   Family history of breast cancer Yes   Family history of pancreatic cancer    Family history of prostate cancer     HISTORY OF PRESENT ILLNESS:   Ms. Deckard, a 55 y.o. female, was seen for a [place] cancer genetics consultation at the request of Dr. Valetta Fuller due to a family history of cancer.  Ms. Rookstool presents to clinic today to discuss the possibility of a hereditary predisposition to cancer, to discuss genetic testing, and to further clarify her future cancer risks, as well as potential cancer risks for family members.   Ms. Aguilera is a 55 y.o. female with no personal history of cancer. Of note, she has a BSO planned for next week and there will likely be pathology ordered on the tumor tissue due to suspicion of granulosa cell tumor.   CANCER HISTORY:  Oncology History   No history exists.   RISK FACTORS:  First live birth at age 6.  OCP use for approximately 10 years.  Ovaries intact: yes, plans to have BSO next week Uterus intact: no.  Menopausal status: premenopausal.  HRT use: 0 years. Colonoscopy: yes; normal. Mammogram within the last year: yes. Number of breast biopsies: 0. Up to date with pelvic exams: yes. Any excessive radiation exposure in the past: no  Past Medical History:  Diagnosis Date   Allergy    Anxiety    Depression    Gout 2020   none since then   HLD (hyperlipidemia)    Hypertension     Past Surgical History:  Procedure Laterality Date   CESAREAN SECTION     CHOLECYSTECTOMY     COLONOSCOPY WITH PROPOFOL N/A 02/16/2021   Procedure: COLONOSCOPY WITH PROPOFOL;  Surgeon: Lin Landsman, MD;  Location: [facility] ENDOSCOPY;  Service: Gastroenterology;  Laterality: N/A;   NOSE SURGERY     Surgery for deviated septum   OTHER SURGICAL HISTORY     Mesh  placement for bladder prolapse versus urinary incontinence   VAGINAL HYSTERECTOMY      Social History   Socioeconomic History   Marital status: Married    Spouse name: Not on file   Number of children: Not on file   Years of education: Not on file   Highest education level: Not on file  Occupational History   Not on file  Tobacco Use   Smoking status: Never    Passive exposure: Past   Smokeless tobacco: Never  Vaping Use   Vaping Use: Never used  Substance and Sexual Activity   Alcohol use: Yes    Comment: ocassionally   Drug use: Never   Sexual activity: Not on file  Other Topics Concern   Not on file  Social History Narrative   Not on file   Social Determinants of Health   Financial Resource Strain: Not on file  Food Insecurity: Not on file  Transportation Needs: Not on file  Physical Activity: Not on file  Stress: Not on file  Social Connections: Not on file     FAMILY HISTORY:  We obtained a detailed, 4-generation family history.  Significant diagnoses are listed below: Family History  Problem Relation Age of Onset   Breast cancer Mother    Hyperlipidemia Mother    Cancer Father        RCC, prostate, skin  Hyperlipidemia Father    Hypertension Father    Brain cancer Brother    Breast cancer Maternal Grandmother    Breast cancer Paternal Aunt    Breast cancer Cousin        pat cousin   Pancreatic cancer Paternal Uncle    Gastric cancer Paternal Uncle      Ms. Cuthbert's brother was diagnosed with brain cancer at 19 and died at 41 due to cancer metastasis. Her maternal grandmother was diagnosed with breast cancer at an unknown age, she is deceased. Of note, Ms. Vest has limited information about her maternal family medical history.   Ms. Eldred father was diagnosed with melanoma once in his 74s and a second time in his 61s. He was also diagnosed with prostate cancer at 34 and had a prostatectomy. Additionally, he was diagnosed with renal cell cancer at  42 and died at 75. Her paternal uncle was diagnosed with a sarcoma "behind his stomach" at age 43, prostate cancer at 45 (he had a prostatectomy), and he was recently diagnosed with pancreatic cancer at 30 (he declined genetic testing). Her paternal aunt was diagnosed with melanoma and thyroid cancer in her 74s. A second paternal aunt was diagnosed with breast cancer in her 1s and Non-Hodgkins lymphoma in her 74s. This aunt's daughter was diagnosed with breast cancer in her 54s. Ms. Thang's paternal grandmother and grandfather were diagnosed with lung cancer, they both smoked and are deceased.   Ms. Soulliere is unaware of previous family history of genetic testing for hereditary cancer risks. There no reported Ashkenazi Jewish ancestry.  GENETIC COUNSELING ASSESSMENT: Ms. Jamroz is a 55 y.o. female with a family history of cancer which is somewhat suggestive of a hereditary predisposition to cancer given the types of cancers. We, therefore, discussed and recommended the following at today's visit.   DISCUSSION: We discussed that 5 - 10% of cancer is hereditary, with most cases of breast, ovarian, prostate, and pancreatic cancer associated with BRCA1/2.  There are other genes that can be associated with hereditary cancer syndromes. We discussed that testing is beneficial for several reasons, including knowing about other cancer risks, identifying potential screening and risk-reduction options that may be appropriate, and to understanding if other family members could be at risk for cancer and allowing them to undergo genetic testing.  We reviewed the characteristics, features and inheritance patterns of hereditary cancer syndromes. We also discussed genetic testing, including the appropriate family members to test, the process of testing, insurance coverage and turn-around-time for results. We discussed the implications of a negative, positive, carrier and/or variant of uncertain significant result.   Ms.  Rung  was offered a common hereditary cancer panel (47 genes) and an expanded pan-cancer panel (77 genes). Ms. Ritchey was informed of the benefits and limitations of each panel, including that expanded pan-cancer panels contain genes that do not have clear management guidelines at this point in time.  We also discussed that as the number of genes included on a panel increases, the chances of variants of uncertain significance increases.  After considering the benefits and limitations of each gene panel, Ms. Winkles  elected to have [facility]'s CancerNext-Expanded (77 gene) Panel.   The CancerNext-Expanded gene panel offered by [facility] and includes sequencing, rearrangement, and RNA analysis for the following 77 genes: AIP, ALK, APC, ATM, AXIN2, BAP1, BARD1, BLM, BMPR1A, BRCA1, BRCA2, BRIP1, CDC73, CDH1, CDK4, CDKN1B, CDKN2A, CHEK2, CTNNA1, DICER1, FANCC, FH, FLCN, GALNT12, KIF1B, LZTR1, MAX, MEN1, MET, MLH1, MSH2, MSH3,  MSH6, MUTYH, NBN, NF1, NF2, NTHL1, PALB2, PHOX2B, PMS2, POT1, PRKAR1A, PTCH1, PTEN, RAD51C, RAD51D, RB1, RECQL, RET, SDHA, SDHAF2, SDHB, SDHC, SDHD, SMAD4, SMARCA4, SMARCB1, SMARCE1, STK11, SUFU, TMEM127, TP53, TSC1, TSC2, VHL and XRCC2 (sequencing and deletion/duplication); EGFR, EGLN1, HOXB13, KIT, MITF, PDGFRA, POLD1, and POLE (sequencing only); EPCAM and GREM1 (deletion/duplication only).   Based on Ms. Castelli's family history of cancer, she meets medical criteria for genetic testing. Despite that she meets criteria, she may still have an out of pocket cost. We discussed that if her out of pocket cost for testing is over $100, the laboratory will call and confirm whether she wants to proceed with testing.  If the out of pocket cost of testing is less than $100 she will be billed by the genetic testing laboratory.   A federal law called the Genetic Information Non-Discrimination Act (GINA) of 2008 helps protect individuals against genetic discrimination based on their genetic test results.   It impacts both health insurance and employment.  With health insurance, it protects against increased premiums, being kicked off insurance or being forced to take a test in order to be insured.  For employment it protects against hiring, firing and promoting decisions based on genetic test results.  GINA does not apply to those in the [employer], those who work for companies with less than 15 employees, and new life insurance or long-term disability insurance policies.  Health status due to a cancer diagnosis is not protected under GINA.  PLAN: After considering the risks, benefits, and limitations, Ms. Rapaport provided informed consent to pursue genetic testing and the blood sample was sent to [facility] for analysis of the CancerNext-Expanded (77 gene) Panel+RNA. Results should be available within approximately 2-3 weeks' time, at which point they will be disclosed by telephone to Ms. Korson, as will any additional recommendations warranted by these results. Ms. Landau will receive a summary of her genetic counseling visit and a copy of her results once available. This information will also be available in Epic.   Ms. Lindsley questions were answered to her satisfaction today. Our contact information was provided should additional questions or concerns arise. Thank you for the referral and allowing [place] to share in the care of your patient.   Lucille Passy, MS, [facility] Genetic Counselor [email or web address] (P) [phone]  The patient was seen for a total of 40 minutes in face-to-face genetic counseling.  The patient brought her husband. Drs. Magrinat, Lindi Adie and/or Burr Medico were available to discuss this case as needed.   _______________________________________________________________________ For Office Staff:  Number of people involved in session: 2 Was an Intern/ student involved with case: no

## 2021-02-24 NOTE — Patient Instructions (Signed)
DUE TO COVID-19 ONLY ONE VISITOR IS ALLOWED TO COME WITH YOU AND STAY IN THE WAITING ROOM ONLY DURING PRE OP AND PROCEDURE DAY OF SURGERY IF YOU ARE GOING HOME AFTER SURGERY. IF YOU ARE SPENDING THE NIGHT 2 PEOPLE MAY VISIT WITH YOU IN YOUR PRIVATE ROOM AFTER SURGERY UNTIL VISITING  HOURS ARE OVER AT 800 PM AND THE 2 VISITORS CANNOT SPEND THE NIGHT.                  Lisa Osborn     Your procedure is scheduled on: 03/03/21   Report to [facility] Main  Entrance   Report to admitting at  8:55 AM     Call this number if you have problems the morning of surgery [phone]    Remember: Do not eat food  :After Midnight the night before your surgery,     You may have clear liquids from midnight until -8:00 am    CLEAR LIQUID DIET   Foods Allowed                                                                     Foods Excluded  Coffee and tea, regular and decaf                             liquids that you cannot  Plain Jell-O any favor except red or purple                                           see through such as: Fruit ices (not with fruit pulp)                                     milk, soups, orange juice  Iced Popsicles                                    All solid food Carbonated beverages, regular and diet                                    Cranberry, grape and apple juices Sports drinks like Gatorade Lightly seasoned clear broth or consume(fat free) Sugar     BRUSH YOUR TEETH MORNING OF SURGERY AND RINSE YOUR MOUTH OUT, NO CHEWING GUM CANDY OR MINTS.     Take these medicines the morning of surgery with A SIP OF WATER: Effexor, Amlodipine                                You may not have any metal on your body including hair pins and              piercings  Do not wear jewelry, make-up, lotions, powders or perfumes, deodorant  Do not wear nail polish on your fingernails.  Do not shave  48 hours prior to surgery.                 Do not bring  valuables to the hospital. [place].  Contacts, dentures or bridgework may not be worn into surgery.        Patients discharged the day of surgery will not be allowed to drive home. IF YOU ARE HAVING SURGERY AND GOING HOME THE SAME DAY, YOU MUST HAVE AN ADULT TO DRIVE YOU HOME AND BE WITH YOU FOR 24 HOURS. YOU MAY GO HOME BY TAXI OR UBER OR ORTHERWISE, BUT AN ADULT MUST ACCOMPANY YOU HOME AND STAY WITH YOU FOR 24 HOURS.  Name and phone number of your driver:  Special Instructions: N/A              Please read over the following fact sheets you were given: _____________________________________________________________________             [facility] - Preparing for Surgery Before surgery, you can play an important role.  Because skin is not sterile, your skin needs to be as free of germs as possible.  You can reduce the number of germs on your skin by washing with CHG (chlorahexidine gluconate) soap before surgery.  CHG is an antiseptic cleaner which kills germs and bonds with the skin to continue killing germs even after washing. Please DO NOT use if you have an allergy to CHG or antibacterial soaps.  If your skin becomes reddened/irritated stop using the CHG and inform your nurse when you arrive at Short Stay. Do not shave (including legs and underarms) for at least 48 hours prior to the first CHG shower.   Please follow these instructions carefully:  1.  Shower with CHG Soap the night before surgery and the  morning of Surgery.  2.  If you choose to wash your hair, wash your hair first as usual with your  normal  shampoo.  3.  After you shampoo, rinse your hair and body thoroughly to remove the  shampoo.                            4.  Use CHG as you would any other liquid soap.  You can apply chg directly  to the skin and wash                       Gently with a scrungie or clean washcloth.  5.  Apply the CHG Soap to your body ONLY FROM THE  NECK DOWN.   Do not use on face/ open                           Wound or open sores. Avoid contact with eyes, ears mouth and genitals (private parts).                       Wash face,  Genitals (private parts) with your normal soap.             6.  Wash thoroughly, paying special attention to the area where your surgery  will be performed.  7.  Thoroughly rinse your body with warm water from the neck down.  8.  DO NOT shower/wash with your normal soap after using and rinsing off  the CHG Soap.                9.  Pat yourself dry with a clean towel.            10.  Wear clean pajamas.            11.  Place clean sheets on your bed the night of your first shower and do not  sleep with pets. Day of Surgery : Do not apply any lotions/deodorants the morning of surgery.  Please wear clean clothes to the hospital/surgery center.  FAILURE TO FOLLOW THESE INSTRUCTIONS MAY RESULT IN THE CANCELLATION OF YOUR SURGERY PATIENT SIGNATURE_________________________________  NURSE SIGNATURE__________________________________  ________________________________________________________________________

## 2021-02-24 NOTE — Progress Notes (Signed)
COVID test- NA   PCP - Jonetta Osgood NP LOV 12/31/20 Cardiologist - no  Chest x-ray - no EKG - 02/24/21-chart Stress Test - no ECHO - no Cardiac Cath - no Pacemaker/ICD device last checked:NA  Sleep Study -yes- negative for apnea CPAP - no  Fasting Blood Sugar - NA Checks Blood Sugar _____ times a day  Blood Thinner Instructions:NA Aspirin Instructions: Last Dose:  Anesthesia review: no  Patient denies shortness of breath, fever, cough and chest pain at PAT appointment Pt has no SOB with any activities  Patient verbalized understanding of instructions that were given to them at the PAT appointment. Patient was also instructed that they will need to review over the PAT instructions again at home before surgery. yes

## 2021-02-25 ENCOUNTER — Encounter: Payer: Self-pay | Admitting: Genetic Counselor

## 2021-03-02 ENCOUNTER — Telehealth: Payer: Self-pay

## 2021-03-02 NOTE — Telephone Encounter (Signed)
Telephone call to check on pre-operative status.  Patient compliant with pre-operative instructions.  Reinforced NPO after midnight.  No questions or concerns voiced.  Instructed to call for any needs.   

## 2021-03-03 ENCOUNTER — Encounter (HOSPITAL_COMMUNITY): Payer: Self-pay | Admitting: Gynecologic Oncology

## 2021-03-03 ENCOUNTER — Ambulatory Visit (HOSPITAL_COMMUNITY): Payer: BC Managed Care – PPO | Admitting: Certified Registered Nurse Anesthetist

## 2021-03-03 ENCOUNTER — Ambulatory Visit (HOSPITAL_COMMUNITY)
Admission: RE | Admit: 2021-03-03 | Discharge: 2021-03-03 | Disposition: A | Discharge status: Home / Self Care | Payer: BC Managed Care – PPO | Source: Ambulatory Visit | Attending: Gynecologic Oncology | Admitting: Gynecologic Oncology

## 2021-03-03 ENCOUNTER — Encounter (HOSPITAL_COMMUNITY)
Admission: RE | Disposition: A | Discharge status: Home / Self Care | Payer: Self-pay | Source: Ambulatory Visit | Attending: Gynecologic Oncology

## 2021-03-03 DIAGNOSIS — D3912 Neoplasm of uncertain behavior of left ovary: Secondary | ICD-10-CM | POA: Insufficient documentation

## 2021-03-03 DIAGNOSIS — Z79899 Other long term (current) drug therapy: Secondary | ICD-10-CM | POA: Insufficient documentation

## 2021-03-03 DIAGNOSIS — Z8616 Personal history of COVID-19: Secondary | ICD-10-CM | POA: Insufficient documentation

## 2021-03-03 DIAGNOSIS — E280 Estrogen excess: Secondary | ICD-10-CM | POA: Insufficient documentation

## 2021-03-03 DIAGNOSIS — N898 Other specified noninflammatory disorders of vagina: Secondary | ICD-10-CM | POA: Diagnosis not present

## 2021-03-03 DIAGNOSIS — Z888 Allergy status to other drugs, medicaments and biological substances status: Secondary | ICD-10-CM | POA: Diagnosis not present

## 2021-03-03 DIAGNOSIS — Z885 Allergy status to narcotic agent status: Secondary | ICD-10-CM | POA: Insufficient documentation

## 2021-03-03 DIAGNOSIS — R1900 Intra-abdominal and pelvic swelling, mass and lump, unspecified site: Secondary | ICD-10-CM

## 2021-03-03 HISTORY — PX: ROBOTIC ASSISTED BILATERAL SALPINGO OOPHERECTOMY: SHX6078

## 2021-03-03 LAB — TYPE AND SCREEN
ABO/RH(D): O NEG
Antibody Screen: NEGATIVE

## 2021-03-03 LAB — ABO/RH: ABO/RH(D): O NEG

## 2021-03-03 SURGERY — SALPINGO-OOPHORECTOMY, BILATERAL, ROBOT-ASSISTED
Anesthesia: General

## 2021-03-03 MED ORDER — SCOPOLAMINE 1 MG/3DAYS TD PT72
1.0000 | MEDICATED_PATCH | TRANSDERMAL | Status: DC
  Administered 2021-03-03: 1.5 mg via TRANSDERMAL
  Filled 2021-03-03: qty 1

## 2021-03-03 MED ORDER — LACTATED RINGERS IV SOLN: INTRAVENOUS | Status: DC

## 2021-03-03 MED ORDER — ORAL CARE MOUTH RINSE: 15.0000 mL | Freq: Once | OROMUCOSAL | Status: AC

## 2021-03-03 MED ORDER — CEFAZOLIN SODIUM-DEXTROSE 2-4 GM/100ML-% IV SOLN
2.0000 g | INTRAVENOUS | Status: AC
  Administered 2021-03-03: 2 g via INTRAVENOUS
  Filled 2021-03-03: qty 100

## 2021-03-03 MED ORDER — FENTANYL CITRATE (PF) 100 MCG/2ML IJ SOLN
INTRAMUSCULAR | Status: DC | PRN
  Administered 2021-03-03 (×2): 100 ug via INTRAVENOUS
  Administered 2021-03-03 (×2): 50 ug via INTRAVENOUS

## 2021-03-03 MED ORDER — PHENYLEPHRINE 40 MCG/ML (10ML) SYRINGE FOR IV PUSH (FOR BLOOD PRESSURE SUPPORT)
PREFILLED_SYRINGE | INTRAVENOUS | Status: AC
  Filled 2021-03-03: qty 10

## 2021-03-03 MED ORDER — ROCURONIUM BROMIDE 10 MG/ML (PF) SYRINGE
PREFILLED_SYRINGE | INTRAVENOUS | Status: AC
  Filled 2021-03-03: qty 10

## 2021-03-03 MED ORDER — MIDAZOLAM HCL 5 MG/5ML IJ SOLN
INTRAMUSCULAR | Status: DC | PRN
  Administered 2021-03-03: 2 mg via INTRAVENOUS

## 2021-03-03 MED ORDER — PROPOFOL 1000 MG/100ML IV EMUL
INTRAVENOUS | Status: AC
  Filled 2021-03-03: qty 100

## 2021-03-03 MED ORDER — ONDANSETRON HCL 4 MG/2ML IJ SOLN: 4.0000 mg | Freq: Once | INTRAMUSCULAR | Status: AC | PRN

## 2021-03-03 MED ORDER — LIDOCAINE HCL (PF) 2 % IJ SOLN
INTRAMUSCULAR | Status: DC | PRN
Start: 2021-03-03 — End: 2021-03-03
  Administered 2021-03-03: 1.5 mg/kg/h

## 2021-03-03 MED ORDER — CHLORHEXIDINE GLUCONATE 0.12 % MT SOLN
15.0000 mL | Freq: Once | OROMUCOSAL | Status: AC
  Administered 2021-03-03: 15 mL via OROMUCOSAL

## 2021-03-03 MED ORDER — FENTANYL CITRATE (PF) 250 MCG/5ML IJ SOLN
INTRAMUSCULAR | Status: AC
  Filled 2021-03-03: qty 5

## 2021-03-03 MED ORDER — HYDROMORPHONE HCL 1 MG/ML IJ SOLN
0.2500 mg | INTRAMUSCULAR | Status: DC | PRN
  Administered 2021-03-03: 0.5 mg via INTRAVENOUS
  Administered 2021-03-03: 0.25 mg via INTRAVENOUS

## 2021-03-03 MED ORDER — MIDAZOLAM HCL 2 MG/2ML IJ SOLN
INTRAMUSCULAR | Status: AC
  Filled 2021-03-03: qty 2

## 2021-03-03 MED ORDER — HEPARIN SODIUM (PORCINE) 5000 UNIT/ML IJ SOLN
5000.0000 [IU] | INTRAMUSCULAR | Status: AC
  Administered 2021-03-03: 5000 [IU] via SUBCUTANEOUS
  Filled 2021-03-03: qty 1

## 2021-03-03 MED ORDER — FENTANYL CITRATE (PF) 100 MCG/2ML IJ SOLN
INTRAMUSCULAR | Status: AC
  Filled 2021-03-03: qty 2

## 2021-03-03 MED ORDER — STERILE WATER FOR INJECTION IJ SOLN
INTRAMUSCULAR | Status: AC
  Filled 2021-03-03: qty 10

## 2021-03-03 MED ORDER — ONDANSETRON HCL 4 MG/2ML IJ SOLN
INTRAMUSCULAR | Status: DC | PRN
  Administered 2021-03-03: 4 mg via INTRAVENOUS

## 2021-03-03 MED ORDER — DEXAMETHASONE SODIUM PHOSPHATE 10 MG/ML IJ SOLN
INTRAMUSCULAR | Status: AC
  Filled 2021-03-03: qty 1

## 2021-03-03 MED ORDER — SODIUM CHLORIDE 0.9% FLUSH: 3.0000 mL | Freq: Two times a day (BID) | INTRAVENOUS | Status: DC

## 2021-03-03 MED ORDER — BUPIVACAINE HCL (PF) 0.25 % IJ SOLN
INTRAMUSCULAR | Status: AC
  Filled 2021-03-03: qty 30

## 2021-03-03 MED ORDER — BUPIVACAINE HCL 0.25 % IJ SOLN
INTRAMUSCULAR | Status: DC | PRN
  Administered 2021-03-03: 50 mL

## 2021-03-03 MED ORDER — CELECOXIB 200 MG PO CAPS
200.0000 mg | ORAL_CAPSULE | ORAL | Status: AC
  Administered 2021-03-03: 200 mg via ORAL
  Filled 2021-03-03: qty 1

## 2021-03-03 MED ORDER — LACTATED RINGERS IR SOLN
Status: DC | PRN
  Administered 2021-03-03: 1000 mL

## 2021-03-03 MED ORDER — LIDOCAINE HCL 2 % IJ SOLN
INTRAMUSCULAR | Status: AC
  Filled 2021-03-03: qty 20

## 2021-03-03 MED ORDER — DEXAMETHASONE SODIUM PHOSPHATE 4 MG/ML IJ SOLN
INTRAMUSCULAR | Status: DC | PRN
  Administered 2021-03-03: 10 mg via INTRAVENOUS

## 2021-03-03 MED ORDER — SUGAMMADEX SODIUM 200 MG/2ML IV SOLN
INTRAVENOUS | Status: DC | PRN
  Administered 2021-03-03: 200 mg via INTRAVENOUS

## 2021-03-03 MED ORDER — PROPOFOL 500 MG/50ML IV EMUL
INTRAVENOUS | Status: DC | PRN
Start: 2021-03-03 — End: 2021-03-03
  Administered 2021-03-03: 25 ug/kg/min via INTRAVENOUS

## 2021-03-03 MED ORDER — PROPOFOL 10 MG/ML IV BOLUS
INTRAVENOUS | Status: DC | PRN
  Administered 2021-03-03: 160 mg via INTRAVENOUS
  Administered 2021-03-03: 20 mg via INTRAVENOUS

## 2021-03-03 MED ORDER — ACETAMINOPHEN 500 MG PO TABS
1000.0000 mg | ORAL_TABLET | ORAL | Status: AC
  Administered 2021-03-03: 1000 mg via ORAL
  Filled 2021-03-03: qty 2

## 2021-03-03 MED ORDER — ONDANSETRON HCL 4 MG/2ML IJ SOLN
INTRAMUSCULAR | Status: AC
  Administered 2021-03-03: 4 mg via INTRAVENOUS
  Filled 2021-03-03: qty 2

## 2021-03-03 MED ORDER — DEXAMETHASONE SODIUM PHOSPHATE 4 MG/ML IJ SOLN: 4.0000 mg | INTRAMUSCULAR | Status: DC

## 2021-03-03 MED ORDER — ONDANSETRON HCL 4 MG/2ML IJ SOLN
INTRAMUSCULAR | Status: AC
  Filled 2021-03-03: qty 2

## 2021-03-03 MED ORDER — LIDOCAINE 2% (20 MG/ML) 5 ML SYRINGE
INTRAMUSCULAR | Status: DC | PRN
  Administered 2021-03-03: 60 mg via INTRAVENOUS

## 2021-03-03 MED ORDER — PHENYLEPHRINE 40 MCG/ML (10ML) SYRINGE FOR IV PUSH (FOR BLOOD PRESSURE SUPPORT)
PREFILLED_SYRINGE | INTRAVENOUS | Status: DC | PRN
  Administered 2021-03-03 (×3): 80 ug via INTRAVENOUS
  Administered 2021-03-03: 120 ug via INTRAVENOUS

## 2021-03-03 MED ORDER — ROCURONIUM BROMIDE 10 MG/ML (PF) SYRINGE
PREFILLED_SYRINGE | INTRAVENOUS | Status: DC | PRN
  Administered 2021-03-03: 60 mg via INTRAVENOUS

## 2021-03-03 MED ORDER — GABAPENTIN 300 MG PO CAPS
300.0000 mg | ORAL_CAPSULE | ORAL | Status: AC
  Administered 2021-03-03: 300 mg via ORAL
  Filled 2021-03-03: qty 1

## 2021-03-03 MED ORDER — HYDROMORPHONE HCL 1 MG/ML IJ SOLN
INTRAMUSCULAR | Status: AC
  Administered 2021-03-03: 0.25 mg via INTRAVENOUS
  Filled 2021-03-03: qty 1

## 2021-03-03 SURGICAL SUPPLY — 69 items
APPLICATOR SURGIFLO ENDO (HEMOSTASIS) IMPLANT
BAG COUNTER SPONGE SURGICOUNT (BAG) IMPLANT
BAG LAPAROSCOPIC 12 15 PORT 16 (BASKET) ×1 IMPLANT
BAG RETRIEVAL 12/15 (BASKET) ×2
BLADE SURG SZ10 CARB STEEL (BLADE) IMPLANT
COVER BACK TABLE 60X90IN (DRAPES) ×2 IMPLANT
COVER TIP SHEARS 8 DVNC (MISCELLANEOUS) ×1 IMPLANT
COVER TIP SHEARS 8MM DA VINCI (MISCELLANEOUS) ×1
DECANTER SPIKE VIAL GLASS SM (MISCELLANEOUS) IMPLANT
DERMABOND ADVANCED (GAUZE/BANDAGES/DRESSINGS) ×1
DERMABOND ADVANCED .7 DNX12 (GAUZE/BANDAGES/DRESSINGS) ×1 IMPLANT
DRAPE ARM DVNC X/XI (DISPOSABLE) ×4 IMPLANT
DRAPE COLUMN DVNC XI (DISPOSABLE) ×1 IMPLANT
DRAPE DA VINCI XI ARM (DISPOSABLE) ×4
DRAPE DA VINCI XI COLUMN (DISPOSABLE) ×1
DRAPE SHEET LG 3/4 BI-LAMINATE (DRAPES) ×2 IMPLANT
DRAPE SURG IRRIG POUCH 19X23 (DRAPES) ×2 IMPLANT
DRSG OPSITE POSTOP 4X6 (GAUZE/BANDAGES/DRESSINGS) ×2 IMPLANT
DRSG OPSITE POSTOP 4X8 (GAUZE/BANDAGES/DRESSINGS) IMPLANT
ELECT PENCIL ROCKER SW 15FT (MISCELLANEOUS) ×2 IMPLANT
ELECT REM PT RETURN 15FT ADLT (MISCELLANEOUS) ×2 IMPLANT
GAUZE 4X4 16PLY ~~LOC~~+RFID DBL (SPONGE) ×4 IMPLANT
GLOVE SURG ENC MOIS LTX SZ6 (GLOVE) ×10 IMPLANT
GLOVE SURG ENC MOIS LTX SZ6.5 (GLOVE) ×4 IMPLANT
GOWN STRL REUS W/ TWL LRG LVL3 (GOWN DISPOSABLE) ×4 IMPLANT
GOWN STRL REUS W/TWL LRG LVL3 (GOWN DISPOSABLE) ×4
HOLDER FOLEY CATH W/STRAP (MISCELLANEOUS) ×2 IMPLANT
IRRIG SUCT STRYKERFLOW 2 WTIP (MISCELLANEOUS) ×2
IRRIGATION SUCT STRKRFLW 2 WTP (MISCELLANEOUS) ×1 IMPLANT
KIT PROCEDURE DA VINCI SI (MISCELLANEOUS)
KIT PROCEDURE DVNC SI (MISCELLANEOUS) IMPLANT
KIT TURNOVER KIT A (KITS) ×2 IMPLANT
LIGASURE IMPACT 36 18CM CVD LR (INSTRUMENTS) ×2 IMPLANT
MANIPULATOR ADVINCU DEL 3.0 PL (MISCELLANEOUS) IMPLANT
MANIPULATOR UTERINE 4.5 ZUMI (MISCELLANEOUS) ×2 IMPLANT
NEEDLE HYPO 21X1.5 SAFETY (NEEDLE) ×2 IMPLANT
NEEDLE SPNL 18GX3.5 QUINCKE PK (NEEDLE) IMPLANT
OBTURATOR OPTICAL STANDARD 8MM (TROCAR) ×1
OBTURATOR OPTICAL STND 8 DVNC (TROCAR) ×1
OBTURATOR OPTICALSTD 8 DVNC (TROCAR) ×1 IMPLANT
PACK ROBOT GYN CUSTOM WL (TRAY / TRAY PROCEDURE) ×2 IMPLANT
PAD POSITIONING PINK XL (MISCELLANEOUS) ×2 IMPLANT
PORT ACCESS TROCAR AIRSEAL 12 (TROCAR) ×1 IMPLANT
PORT ACCESS TROCAR AIRSEAL 5M (TROCAR) ×1
POUCH SPECIMEN RETRIEVAL 10MM (ENDOMECHANICALS) ×2 IMPLANT
SCRUB EXIDINE 4% CHG 4OZ (MISCELLANEOUS) ×2 IMPLANT
SEAL CANN UNIV 5-8 DVNC XI (MISCELLANEOUS) ×4 IMPLANT
SEAL XI 5MM-8MM UNIVERSAL (MISCELLANEOUS) ×4
SET TRI-LUMEN FLTR TB AIRSEAL (TUBING) ×2 IMPLANT
SPONGE T-LAP 18X18 ~~LOC~~+RFID (SPONGE) ×2 IMPLANT
SURGIFLO W/THROMBIN 8M KIT (HEMOSTASIS) IMPLANT
SUT MNCRL AB 4-0 PS2 18 (SUTURE) ×2 IMPLANT
SUT PDS AB 1 CT1 27 (SUTURE) IMPLANT
SUT PDS AB 1 TP1 96 (SUTURE) ×4 IMPLANT
SUT VIC AB 0 CT1 27 (SUTURE)
SUT VIC AB 0 CT1 27XBRD ANTBC (SUTURE) IMPLANT
SUT VIC AB 2-0 CT1 27 (SUTURE) ×1
SUT VIC AB 2-0 CT1 TAPERPNT 27 (SUTURE) ×1 IMPLANT
SUT VIC AB 4-0 PS2 18 (SUTURE) ×6 IMPLANT
SYR 10ML LL (SYRINGE) IMPLANT
SYS WOUND ALEXIS 18CM MED (MISCELLANEOUS) ×2
SYSTEM WOUND ALEXIS 18CM MED (MISCELLANEOUS) ×1 IMPLANT
TOWEL OR NON WOVEN STRL DISP B (DISPOSABLE) ×2 IMPLANT
TRAP SPECIMEN MUCUS 40CC (MISCELLANEOUS) ×2 IMPLANT
TRAY FOLEY MTR SLVR 16FR STAT (SET/KITS/TRAYS/PACK) ×2 IMPLANT
TROCAR XCEL NON-BLD 5MMX100MML (ENDOMECHANICALS) IMPLANT
UNDERPAD 30X36 HEAVY ABSORB (UNDERPADS AND DIAPERS) ×2 IMPLANT
WATER STERILE IRR 1000ML POUR (IV SOLUTION) ×2 IMPLANT
YANKAUER SUCT BULB TIP 10FT TU (MISCELLANEOUS) ×2 IMPLANT

## 2021-03-03 NOTE — Op Note (Signed)
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   Anesthesia: GET  Urine Output: 200cc  Operative Findings: On EUA, 10-12 cm mobile mass within the pelvis, smooth. 3-4 1-3 mm firm nodules in apical vagina. On intra-abdominal entry, normal upper abdominal survey. Normal omentum, small and large bowel. Some adhesions of the bowel and omentum to the right abdominal side wall. Left ovary replaced by 10-12 cm cystic mass with engorgement on ovarian vessels. Normal appearing fallopian tube. Normal appearing right adnexa. Uterus/cervix surgically absent. No ascites. No adenopathy. No intra-abdominal or pelvic evidence of disease.  Estimated Blood Loss:  less than 100 mL      Total IV Fluids: see I&O flowsheet         Specimens: pelvic washings, bilateral tubes and ovaries, omentum, vaginal nodule         Complications:  None apparent; patient tolerated the procedure well.         Disposition: PACU - hemodynamically stable.  Procedure Details  The patient was seen in the Holding Room. The risks, benefits, complications, treatment options, and expected outcomes were discussed with the patient.  The patient concurred with the proposed plan, giving informed consent.  The site of surgery properly noted/marked. The patient was identified as Lisa Osborn and the procedure verified as a Robotic-assisted bilateral salpingo oophorectomy with possible staging.   After induction of anesthesia, the patient was draped and prepped in  the usual sterile manner. Patient was placed in supine position after anesthesia and draped and prepped in the usual sterile manner as follows: Her arms were tucked to her side with all appropriate precautions.  The shoulders were stabilized with padded shoulder blocks applied to the acromium processes.  The patient was placed in the semi-lithotomy position in [place].  The perineum and vagina were prepped with CholoraPrep. The patient was draped after the CholoraPrep had been allowed to dry for 3 minutes.  A Time Out was held and the above information confirmed.  The urethra was prepped with Betadine. Foley catheter was placed. OG tube placement was confirmed and to suction.   Next, a 10 mm skin incision was made 1 cm below the subcostal margin in the midclavicular line.  The 5 mm Optiview port and scope was used for direct entry.  Opening pressure was under 10 mm CO2.  The abdomen was insufflated and the findings were noted as above.   At this point and all points during the procedure, the patient's intra-abdominal pressure did not exceed 12 mmHg. Next, an 8 mm skin incision was made superior to the umbilicus and a right and left port were placed about 8 cm lateral to the robot port on the right and left side.  The 5 mm assist trocar was exchanged for a 10-12 mm port. The supraumbilical incision was extended to a length of 6 cm with a scalpel and carried down and through the fascia with monopolar electrocautery. The peritoneal incision was extended. The Alexis retractor and laparoscopic cap system was placed and the robot trocar inserted through the cap. All ports were placed under direct visualization.  The patient was placed in  steep Trendelenburg.  Bowel was folded away into the upper abdomen.  The robot was docked in the normal manner.  The right and left peritoneum were opened parallel to the IP ligament to open the retroperitoneal spaces bilaterally. The remnant of the left round ligament was  transected. The ureter was noted to be on the medial leaf of the broad ligament.  The peritoneum above the ureter was incised and stretched and the infundibulopelvic ligament was skeletonized, cauterized and cut.  The peritoneal attachment of bilateral adnexa was cauterized with monopoalar electrocautery, freeing the adnexa. The left adnexa was placed in a 15 mm Endocatch bag and the right adnexa in a 10 mm one. Irrigation was used and excellent hemostasis was achieved. Robotic instruments were removed under direct visulaization.  The robot was undocked.   The cap was removed and the bags were brought trough the mini-lap. Assuring no intra-abdominal spillage, the large cystic mass was opened within the bag and decompressed. It was ultimately removed and sent to pathology for frozen.   An infracolic omentectomy was then performed using the Ligasure device. Once the frozen returned confirming SCST, the procedure was complete.  The fascia of the mini-lap incision was closed with running sutures of #1 looped PDS tied in the midline. The subcutaneous tissue was irrigated and additional local anesthesia was inject. The subcutaneous tissue was reapproximated with 2-0 Vicryl.   The fascia at the 10-12 mm port was closed with 0 Vicryl on a UR-5 needle.  The subcuticular tissue was closed with 4-0 Vicryl and the skin was closed with 4-0 Monocryl in a subcuticular manner.  Dermabond was applied.    The vagina was examined. One of the nodules previously noted was excised to be sent for pathology. Electrocautery was used to achieve hemostasis.  All sponge, lap and needle counts were correct x  3.   The patient was transferred to the recovery room in stable condition.  Jeral Pinch, MD

## 2021-03-03 NOTE — Anesthesia Postprocedure Evaluation (Signed)
Anesthesia Post Note  Patient: Lisa Osborn  Procedure(s) Performed: XI ROBOTIC ASSISTED BILATERAL SALPINGO OOPHORECTOMY,MINI LAPAROTOMY AND OMENTECTOMY, VAGINAL MASS EXCISION     Patient location during evaluation: PACU Anesthesia Type: General Level of consciousness: awake and alert and oriented Pain management: pain level controlled Vital Signs Assessment: post-procedure vital signs reviewed and stable Respiratory status: spontaneous breathing, nonlabored ventilation and respiratory function stable Cardiovascular status: blood pressure returned to baseline and stable Anesthetic complications: no Comments: Given Barhemsys in PACU for PON.   No notable events documented.  Last Vitals:  Vitals:   03/03/21 1400 03/03/21 1410  BP: 114/60   Pulse: 64 73  Resp: 13   Temp:    SpO2: 93% 94%    Last Pain:  Vitals:   03/03/21 1410  TempSrc:   PainSc: 0-No pain                 [name] A.

## 2021-03-03 NOTE — Transfer of Care (Signed)
Immediate Anesthesia Transfer of Care Note  Patient: Lisa Osborn  Procedure(s) Performed: XI ROBOTIC ASSISTED BILATERAL SALPINGO OOPHORECTOMY,MINI LAPAROTOMY AND OMENTECTOMY, VAGINAL MASS EXCISION  Patient Location: PACU  Anesthesia Type:General  Level of Consciousness: awake, alert , oriented and patient cooperative  Airway & Oxygen Therapy: Patient Spontanous Breathing and Patient connected to face mask  Post-op Assessment: Report given to RN and Post -op Vital signs reviewed and stable  Post vital signs: Reviewed and stable  Last Vitals:  Vitals Value Taken Time  BP 104/65 03/03/21 1321  Temp    Pulse 79 03/03/21 1323  Resp 17 03/03/21 1323  SpO2 98 % 03/03/21 1323  Vitals shown include unvalidated device data.  Last Pain:  Vitals:   03/03/21 0935  TempSrc:   PainSc: 5          Complications: No notable events documented.

## 2021-03-03 NOTE — Discharge Instructions (Addendum)
03/03/2021  Return to work: 4-6 weeks  Activity: 1. Be up and out of the bed during the day.  Take a nap if needed.  You may walk up steps but be careful and use the hand rail.  Stair climbing will tire you more than you think, you may need to stop part way and rest.   2. No lifting or straining for 6 weeks.  3. No driving until off narcotics and you can brake safely. For most people, this is 5-10 days.    4. Shower daily.  Use soap and water on your incision and pat dry; don't rub.   5. No sexual activity and nothing in the vagina for 4-6 weeks.  Medications:  - Take ibuprofen and tylenol first line for pain control. Take these regularly (every 6 hours) to decrease the build up of pain.  - If necessary, for severe pain not relieved by ibuprofen, take tramadol.  - While taking tramadol you should take sennakot every night to reduce the likelihood of constipation. If this causes diarrhea, stop its use.  Diet: 1. Low sodium Heart Healthy Diet is recommended.  2. It is safe to use a laxative if you have difficulty moving your bowels.   Wound Care: 1. Keep clean and dry.  Shower daily.  Reasons to call the Doctor:  Fever - Oral temperature greater than 100.4 degrees Fahrenheit Foul-smelling vaginal discharge Difficulty urinating Nausea and vomiting Increased pain at the site of the incision that is unrelieved with pain medicine. Difficulty breathing with or without chest pain New calf pain especially if only on one side Sudden, continuing increased vaginal bleeding with or without clots.   Follow-up: 1. See Jeral Pinch at end of the month as scheduled.  Contacts: For questions or concerns you should contact:  Dr. Jeral Pinch at [phone] After hours and on week-ends call [phone] and ask to speak to the physician on call for Gynecologic Oncology

## 2021-03-03 NOTE — Interval H&P Note (Signed)
History and Physical Interval Note:  03/03/2021 10:11 AM  Lisa Osborn  has presented today for surgery, with the diagnosis of PELVIC MASS.  The various methods of treatment have been discussed with the patient and family. After consideration of risks, benefits and other options for treatment, the patient has consented to  Procedure(s): XI ROBOTIC ASSISTED BILATERAL SALPINGO OOPHORECTOMY;POSSIBLE STAGING AND LAPAROTOMY (N/A) as a surgical intervention.  The patient's history has been reviewed, patient examined, no change in status, stable for surgery.  I have reviewed the patient's chart and labs.  Questions were answered to the patient's satisfaction.     Lafonda Mosses

## 2021-03-03 NOTE — Anesthesia Preprocedure Evaluation (Addendum)
Anesthesia Evaluation  Patient identified by MRN, date of birth, ID band Patient awake    Reviewed: Allergy & Precautions, NPO status , Patient's Chart, lab work & pertinent test results, reviewed documented beta blocker date and time   Airway Mallampati: II  TM Distance: >3 FB Neck ROM: Full    Dental  (+) Dental Advisory Given, Poor Dentition, Chipped, Missing, Caps,    Pulmonary neg pulmonary ROS,    Pulmonary exam normal breath sounds clear to auscultation       Cardiovascular hypertension, Pt. on medications Normal cardiovascular exam Rhythm:Regular Rate:Normal     Neuro/Psych  Headaches, PSYCHIATRIC DISORDERS Anxiety Depression Hx/o panic attacks   GI/Hepatic negative GI ROS, Neg liver ROS,   Endo/Other  Hx/o HLD  Renal/GU negative Renal ROS  negative genitourinary   Musculoskeletal negative musculoskeletal ROS (+)   Abdominal   Peds  Hematology negative hematology ROS (+)   Anesthesia Other Findings   Reproductive/Obstetrics Pelvic mass                            Anesthesia Physical Anesthesia Plan  ASA: 2  Anesthesia Plan: General   Post-op Pain Management:    Induction: Intravenous  PONV Risk Score and Plan: 4 or greater and Treatment may vary due to age or medical condition, Midazolam, Scopolamine patch - Pre-op, Ondansetron and Dexamethasone  Airway Management Planned: Oral ETT  Additional Equipment:   Intra-op Plan:   Post-operative Plan: Extubation in OR  Informed Consent: I have reviewed the patients History and Physical, chart, labs and discussed the procedure including the risks, benefits and alternatives for the proposed anesthesia with the patient or authorized representative who has indicated his/her understanding and acceptance.     Dental advisory given  Plan Discussed with: CRNA and Anesthesiologist  Anesthesia Plan Comments:          Anesthesia Quick Evaluation

## 2021-03-03 NOTE — Anesthesia Procedure Notes (Signed)
Procedure Name: Intubation Date/Time: 03/03/2021 11:11 AM Performed by: Claudia Desanctis, CRNA Pre-anesthesia Checklist: Patient identified, Emergency Drugs available, Suction available and Patient being monitored Patient Re-evaluated:Patient Re-evaluated prior to induction Oxygen Delivery Method: Circle system utilized Preoxygenation: Pre-oxygenation with 100% oxygen Induction Type: IV induction Ventilation: Mask ventilation without difficulty Laryngoscope Size: 2 and Miller Grade View: Grade II Tube type: Oral Tube size: 7.0 mm Number of attempts: 1 Airway Equipment and Method: Stylet Placement Confirmation: ETT inserted through vocal cords under direct vision, positive ETCO2 and breath sounds checked- equal and bilateral Secured at: 21 cm Tube secured with: Tape Dental Injury: Teeth and Oropharynx as per pre-operative assessment  Difficulty Due To: Difficult Airway- due to anterior larynx

## 2021-03-04 ENCOUNTER — Encounter (HOSPITAL_COMMUNITY): Payer: Self-pay | Admitting: Gynecologic Oncology

## 2021-03-04 ENCOUNTER — Inpatient Hospital Stay (HOSPITAL_BASED_OUTPATIENT_CLINIC_OR_DEPARTMENT_OTHER): Payer: BC Managed Care – PPO | Admitting: Gynecologic Oncology

## 2021-03-04 ENCOUNTER — Telehealth: Payer: Self-pay

## 2021-03-04 ENCOUNTER — Other Ambulatory Visit: Payer: Self-pay | Admitting: Gynecologic Oncology

## 2021-03-04 DIAGNOSIS — Z9889 Other specified postprocedural states: Secondary | ICD-10-CM

## 2021-03-04 DIAGNOSIS — Z90722 Acquired absence of ovaries, bilateral: Secondary | ICD-10-CM

## 2021-03-04 DIAGNOSIS — R519 Headache, unspecified: Secondary | ICD-10-CM

## 2021-03-04 DIAGNOSIS — D3912 Neoplasm of uncertain behavior of left ovary: Secondary | ICD-10-CM

## 2021-03-04 DIAGNOSIS — R112 Nausea with vomiting, unspecified: Secondary | ICD-10-CM

## 2021-03-04 DIAGNOSIS — Z7189 Other specified counseling: Secondary | ICD-10-CM

## 2021-03-04 HISTORY — DX: Neoplasm of uncertain behavior of left ovary: D39.12

## 2021-03-04 LAB — CYTOLOGY - NON PAP

## 2021-03-04 LAB — SURGICAL PATHOLOGY

## 2021-03-04 MED ORDER — ONDANSETRON HCL 4 MG PO TABS: 4.0000 mg | ORAL_TABLET | Freq: Three times a day (TID) | ORAL | 0 refills | Status: DC | PRN

## 2021-03-04 MED ORDER — BUTALBITAL-APAP-CAFFEINE 50-325-40 MG PO TABS: 1.0000 | ORAL_TABLET | Freq: Four times a day (QID) | ORAL | 0 refills | Status: DC | PRN

## 2021-03-04 NOTE — Progress Notes (Signed)
Gynecologic Oncology Telehealth Consult Note: Gyn-Onc  I connected with Lisa Osborn on 03/04/21 at 12:00 PM EDT by telephone and verified that I am speaking with the correct person using two identifiers.  I discussed the limitations, risks, security and privacy concerns of performing an evaluation and management service by telemedicine and the availability of in-person appointments. I also discussed with the patient that there may be a patient responsible charge related to this service. The patient expressed understanding and agreed to proceed.  Other persons participating in the visit and their role in the encounter: none.  Patient's location: [place] Provider's location: [place]  Reason for Visit: follow-up after surgery, treatment discussion  Treatment History: 10/11: Robotic BSO, mini-lap, omentectomy. Pathology shows adult granulosa cell tumor  Interval History: Has had some dizziness and nausea. Removed scopolamine patch after speaking with my office this morning. Has tolerated some light foods, denies emesis. Pain controlled with Tylenol and ibuprofen. Has some incisional pain, some pain under her diaphragm, and some pain within there pelvis. Voiding without difficulty. No bowel function.   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   HLD (hyperlipidemia)    Hypertension     Past Surgical History:  Procedure Laterality Date   CESAREAN SECTION     CHOLECYSTECTOMY     COLONOSCOPY WITH PROPOFOL N/A 02/16/2021   Procedure: COLONOSCOPY WITH PROPOFOL;  Surgeon: Lin Landsman, MD;  Location: [facility];  Service: Gastroenterology;  Laterality: N/A;   NOSE SURGERY     Surgery for deviated septum   OTHER SURGICAL HISTORY     Mesh placement for bladder prolapse versus urinary incontinence   ROBOTIC ASSISTED BILATERAL SALPINGO OOPHERECTOMY  N/A 03/03/2021   Procedure: XI ROBOTIC ASSISTED BILATERAL SALPINGO OOPHORECTOMY,MINI LAPAROTOMY AND OMENTECTOMY, VAGINAL MASS EXCISION;  Surgeon: Lafonda Mosses, MD;  Location: [facility];  Service: Gynecology;  Laterality: N/A;   VAGINAL HYSTERECTOMY      Family History  Problem Relation Age of Onset   Hyperlipidemia Mother    Hyperlipidemia Father    Hypertension Father    Renal cancer Father 79   Prostate cancer Father 68   Melanoma Father        dx. in 46s and a second time in his 37s   Brain cancer Brother 24   Breast cancer Paternal Aunt        dx. 72s   Non-Hodgkin's lymphoma Paternal Aunt        dx. 91s   Thyroid cancer Paternal Aunt        dx. 17s   Melanoma Paternal Aunt        dx. 68s   Pancreatic cancer Paternal Uncle 37   Gastric cancer Paternal Uncle 40   Prostate cancer Paternal Uncle 3   Breast cancer Maternal Grandmother    Lung cancer Paternal Grandmother        smoked, dx. 45s   Lung cancer Paternal Grandfather        smoked, dx. 76s   Breast cancer Cousin        pat cousin, dx. 58s    Social History   Socioeconomic History   Marital status: Married    Spouse name: Not on file   Number of children: Not on file   Years of education: Not on file   Highest education  level: Not on file  Occupational History   Not on file  Tobacco Use   Smoking status: Never    Passive exposure: Past   Smokeless tobacco: Never  Vaping Use   Vaping Use: Never used  Substance and Sexual Activity   Alcohol use: Yes    Comment: ocassionally   Drug use: Never   Sexual activity: Not on file  Other Topics Concern   Not on file  Social History Narrative   Not on file   Social Determinants of Health   Financial Resource Strain: Not on file  Food Insecurity: Not on file  Transportation Needs: Not on file  Physical Activity: Not on file  Stress: Not on file  Social Connections: Not on file    Current Medications:  Current Outpatient Medications:     amLODipine (NORVASC) 10 MG tablet, Take 1 tablet (10 mg total) by mouth daily., Disp: 90 tablet, Rfl: 1   butalbital-acetaminophen-caffeine (FIORICET) 50-325-40 MG tablet, Take 1 tablet by mouth every 6 (six) hours as needed for headache., Disp: 10 tablet, Rfl: 0   EPINEPHrine 0.3 mg/0.3 mL IJ SOAJ injection, Inject 0.3 mg into the muscle as needed for anaphylaxis., Disp: , Rfl:    hydrochlorothiazide (HYDRODIURIL) 12.5 MG tablet, Take 1 tablet (12.5 mg total) by mouth daily., Disp: 90 tablet, Rfl: 2   ibuprofen (ADVIL) 800 MG tablet, Take 1 tablet (800 mg total) by mouth every 8 (eight) hours as needed for moderate pain. For AFTER surgery only, Disp: 30 tablet, Rfl: 0   ondansetron (ZOFRAN) 4 MG tablet, Take 1 tablet (4 mg total) by mouth every 8 (eight) hours as needed for nausea or vomiting., Disp: 5 tablet, Rfl: 0   senna-docusate (SENOKOT-S) 8.6-50 MG tablet, Take 2 tablets by mouth at bedtime. For AFTER surgery, do not take if having diarrhea, Disp: 30 tablet, Rfl: 0   traMADol (ULTRAM) 50 MG tablet, Take 1 tablet (50 mg total) by mouth every 6 (six) hours as needed for severe pain. For AFTER surgery only, do not take and drive, Disp: 10 tablet, Rfl: 0   venlafaxine XR (EFFEXOR-XR) 37.5 MG 24 hr capsule, TAKE 1 CAPSULE BY MOUTH DAILY WITH BREAKFAST., Disp: 90 capsule, Rfl: 1   zolpidem (AMBIEN) 10 MG tablet, Take 1 tablet (10 mg total) by mouth at bedtime as needed for sleep. (Patient taking differently: Take 10 mg by mouth at bedtime.), Disp: 30 tablet, Rfl: 2  Review of Symptoms: As per HPI  Physical Exam: There were no vitals taken for this visit. Deferred given limitations of phone visit  Laboratory & Radiologic Studies: SURGICAL PATHOLOGY  A. OVARY AND FALLOPIAN TUBE, LEFT, SALPINGO OOPHORECTOMY:  - Left ovary      Granulosa cell tumor, adult type, 292 g.      See oncology table and comment.  - Left Fallopian tube      Unremarkable.      No endometriosis or malignancy.   B.  OVARY AND FALLOPIAN TUBE, RIGHT, SALPINGO OOPHORECTOMY:  - Right ovary      Benign fibrotic, partially calcified nodule.      No endometriosis or malignancy.  - Right Fallopian tube      Unremarkable.      No endometriosis or malignancy.   C. OMENTUM, EXCISION:  - Benign adipose tissue consistent with omentum.  - No evidence of malignancy.   D. VAGINA, NODULE, BIOPSY:  - Benign fibroepithelial polyp.  - No dysplasia or malignancy.   Cytology pending  Assessment &  Plan: Lisa Osborn is a 56 y.o. woman with Stage IA adult-type granulosa cell tumor who presents for follow-up phone visit.  Meeting some milestones. Discussed treatment of nausea and diet changes. Also reviewed use of Gas Ex and bowel regimen. Given MIS, some of symptoms are likely related to CO2 insufflation.  Reviewed final pathology. Cytology pending, but path confirms stage IA GCT, adult-type. Will review case at tumor board, but given presumed size (10cm), unless features that increase her risk of recurrence are noted, my recommendation is for close surveillance.   I discussed the assessment and treatment plan with the patient. The patient was provided with an opportunity to ask questions and all were answered. The patient agreed with the plan and demonstrated an understanding of the instructions.   The patient was advised to call back or see an in-person evaluation if the symptoms worsen or if the condition fails to improve as anticipated.   18 minutes of total time was spent for this patient encounter, including preparation, face-to-face counseling with the patient and coordination of care, and documentation of the encounter.   Jeral Pinch, MD  Division of Gynecologic Oncology  Department of Obstetrics and Gynecology  [facility]

## 2021-03-04 NOTE — Progress Notes (Signed)
See RN post-op call note. Patient reporting having a headache for the past 6 days, present before surgery. The headache is on the left side. She normally takes excedrin migraine but has not taken this due to aspirin component. No vision changes, lightheadedness, dizziness. She states the headache is still present but better. She has mild nausea as well. Plan to prescribe fioricet as needed along with zofran.

## 2021-03-04 NOTE — Telephone Encounter (Signed)
Spoke with Ms. Warnick this morning. She states she is eating, drinking and urinating well. She is having nausea and is eating smaller meals and crackers. Patient reports scopolamine patch is still in place. She has not had a BM yet and is not passing gas. She is taking senokot as prescribed and encouraged her to drink plenty of water. She denies fever or chills. Incisions are dry and intact. Honeycomb dressing is in place, instructed to remove 5 days after surgery. She rates her pain as a 6/10. Her pain is controlled with advil and tylenol. She has tramadol if she needs it. Patient inquiring if she can take Excedrin migraine. She reports having a headache on the left side of her head for 6 days. She states it is getting a little better. She denies vision changes, hearing changes, balance changes or difficulty speaking. She does report dizziness upon standing and has her step mom with her when she gets up. Joylene John, NP notified.  Per NP, patient can remove her scopolamine patch as this can cause dizziness. Fioricet and Zofran will be called into pharmacy. Instructed patient to call our office if headache, dizziness and nausea does not improve. Instructed patient to drink plenty of water throughout the day as the Zofran can also cause constipation. Fioricet contains acetaminophen, instructed patient not to exceed 4,000mg  of acetaminophen in a 24hr period.  Instructed to call office with any fever, chills, purulent drainage, uncontrolled pain or any other questions or concerns. Patient verbalizes understanding.   Pt aware of post op appointments as well as the office number [phone] and after hours number [phone] to call if she has any questions or concerns

## 2021-03-06 ENCOUNTER — Telehealth: Payer: Self-pay | Admitting: Nurse Practitioner

## 2021-03-06 NOTE — Telephone Encounter (Signed)
Lisa Osborn had surgery on 03/04/21 to remove suspected tumor in pelvic area. Called to check in with her today. She is recovering well, and does not need anything at this time. She will come in for nurse visit on 03/16/21 for B12 injection and has an office visit for follow up on 04/01/21. She will follow up with gynecologic oncology before then. Patient instructed to call the clinic if she needs anything.

## 2021-03-10 ENCOUNTER — Telehealth: Payer: Self-pay | Admitting: *Deleted

## 2021-03-10 ENCOUNTER — Telehealth: Payer: BC Managed Care – PPO | Admitting: Gynecologic Oncology

## 2021-03-10 NOTE — Telephone Encounter (Signed)
Patient called and stated that "I don't remember everything Dr Berline Lopes said last week. Can she call me today or tomorrow regarding her surgery results." Explained that the message will be given to Dr Berline Lopes, but that Dr Berline Lopes is in the [place] today and may call later tonight or tomorrow

## 2021-03-10 NOTE — Telephone Encounter (Signed)
Scheduled the patient for a telephone appt tomorrow per Dr Berline Lopes. Called and left the patient a message

## 2021-03-11 ENCOUNTER — Inpatient Hospital Stay (HOSPITAL_BASED_OUTPATIENT_CLINIC_OR_DEPARTMENT_OTHER): Payer: BC Managed Care – PPO | Admitting: Gynecologic Oncology

## 2021-03-11 DIAGNOSIS — Z7189 Other specified counseling: Secondary | ICD-10-CM

## 2021-03-11 DIAGNOSIS — D3912 Neoplasm of uncertain behavior of left ovary: Secondary | ICD-10-CM

## 2021-03-11 DIAGNOSIS — Z90722 Acquired absence of ovaries, bilateral: Secondary | ICD-10-CM

## 2021-03-11 DIAGNOSIS — T7840XA Allergy, unspecified, initial encounter: Secondary | ICD-10-CM

## 2021-03-11 MED ORDER — TRIAMCINOLONE ACETONIDE 0.025 % EX OINT: 1.0000 "application " | TOPICAL_OINTMENT | Freq: Two times a day (BID) | CUTANEOUS | 0 refills | Status: DC

## 2021-03-11 NOTE — Progress Notes (Signed)
Gynecologic Oncology Telehealth Consult Note: Gyn-Onc  I connected with Raelyn Ensign on 03/11/21 at  4:15 PM EDT by telephone and verified that I am speaking with the correct person using two identifiers.  I discussed the limitations, risks, security and privacy concerns of performing an evaluation and management service by telemedicine and the availability of in-person appointments. I also discussed with the patient that there may be a patient responsible charge related to this service. The patient expressed understanding and agreed to proceed.  Other persons participating in the visit and their role in the encounter: None.  Patient's location: Home Provider's location: [facility]  Reason for Visit: Questions regarding treatment planning  Treatment History: 10/11: Robotic BSO, mini-lap, omentectomy. Pathology shows adult granulosa cell tumor  Interval History: Patient overall reports doing well since her phone visit.  She notes that she was taking some pain medications around the time that we spoke and does not remember all the details of our discussion.  She would like some clarification today as she has reviewed her reports in my chart.  Additionally, she notes some pruritus and erythema around all of her incisions in the distribution of her Dermabond.  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   HLD (hyperlipidemia)    Hypertension     Past Surgical History:  Procedure Laterality Date   CESAREAN SECTION     CHOLECYSTECTOMY     COLONOSCOPY WITH PROPOFOL N/A 02/16/2021   Procedure: COLONOSCOPY WITH PROPOFOL;  Surgeon: Lin Landsman, MD;  Location: [facility];  Service: Gastroenterology;  Laterality: N/A;   NOSE SURGERY     Surgery for deviated septum   OTHER SURGICAL HISTORY     Mesh placement for bladder  prolapse versus urinary incontinence   ROBOTIC ASSISTED BILATERAL SALPINGO OOPHERECTOMY N/A 03/03/2021   Procedure: XI ROBOTIC ASSISTED BILATERAL SALPINGO OOPHORECTOMY,MINI LAPAROTOMY AND OMENTECTOMY, VAGINAL MASS EXCISION;  Surgeon: Lafonda Mosses, MD;  Location: [facility];  Service: Gynecology;  Laterality: N/A;   VAGINAL HYSTERECTOMY      Family History  Problem Relation Age of Onset   Hyperlipidemia Mother    Hyperlipidemia Father    Hypertension Father    Renal cancer Father 74   Prostate cancer Father 59   Melanoma Father        dx. in 71s and a second time in his 55s   Brain cancer Brother 14   Breast cancer Paternal Aunt        dx. 75s   Non-Hodgkin's lymphoma Paternal Aunt        dx. 14s   Thyroid cancer Paternal Aunt        dx. 60s   Melanoma Paternal Aunt        dx. 91s   Pancreatic cancer Paternal Uncle 69   Gastric cancer Paternal Uncle 75   Prostate cancer Paternal Uncle 53   Breast cancer Maternal Grandmother    Lung cancer Paternal Grandmother        smoked, dx. 8s   Lung cancer Paternal Grandfather        smoked, dx. 42s   Breast cancer Cousin        pat cousin, dx. 52s    Social History   Socioeconomic History   Marital status: Married    Spouse name: Not  on file   Number of children: Not on file   Years of education: Not on file   Highest education level: Not on file  Occupational History   Not on file  Tobacco Use   Smoking status: Never    Passive exposure: Past   Smokeless tobacco: Never  Vaping Use   Vaping Use: Never used  Substance and Sexual Activity   Alcohol use: Yes    Comment: ocassionally   Drug use: Never   Sexual activity: Not on file  Other Topics Concern   Not on file  Social History Narrative   Not on file   Social Determinants of Health   Financial Resource Strain: Not on file  Food Insecurity: Not on file  Transportation Needs: Not on file  Physical Activity: Not on file  Stress: Not on file  Social  Connections: Not on file    Current Medications:  Current Outpatient Medications:    triamcinolone (KENALOG) 0.025 % ointment, Apply 1 application topically 2 (two) times daily., Disp: 30 g, Rfl: 0   amLODipine (NORVASC) 10 MG tablet, Take 1 tablet (10 mg total) by mouth daily., Disp: 90 tablet, Rfl: 1   butalbital-acetaminophen-caffeine (FIORICET) 50-325-40 MG tablet, Take 1 tablet by mouth every 6 (six) hours as needed for headache., Disp: 10 tablet, Rfl: 0   EPINEPHrine 0.3 mg/0.3 mL IJ SOAJ injection, Inject 0.3 mg into the muscle as needed for anaphylaxis., Disp: , Rfl:    hydrochlorothiazide (HYDRODIURIL) 12.5 MG tablet, Take 1 tablet (12.5 mg total) by mouth daily., Disp: 90 tablet, Rfl: 2   ibuprofen (ADVIL) 800 MG tablet, Take 1 tablet (800 mg total) by mouth every 8 (eight) hours as needed for moderate pain. For AFTER surgery only, Disp: 30 tablet, Rfl: 0   ondansetron (ZOFRAN) 4 MG tablet, Take 1 tablet (4 mg total) by mouth every 8 (eight) hours as needed for nausea or vomiting., Disp: 5 tablet, Rfl: 0   senna-docusate (SENOKOT-S) 8.6-50 MG tablet, Take 2 tablets by mouth at bedtime. For AFTER surgery, do not take if having diarrhea, Disp: 30 tablet, Rfl: 0   traMADol (ULTRAM) 50 MG tablet, Take 1 tablet (50 mg total) by mouth every 6 (six) hours as needed for severe pain. For AFTER surgery only, do not take and drive, Disp: 10 tablet, Rfl: 0   venlafaxine XR (EFFEXOR-XR) 37.5 MG 24 hr capsule, TAKE 1 CAPSULE BY MOUTH DAILY WITH BREAKFAST., Disp: 90 capsule, Rfl: 1   zolpidem (AMBIEN) 10 MG tablet, Take 1 tablet (10 mg total) by mouth at bedtime as needed for sleep. (Patient taking differently: Take 10 mg by mouth at bedtime.), Disp: 30 tablet, Rfl: 2  Review of Symptoms: Pertinent positives as per HPI.  Physical Exam: There were no vitals taken for this visit. Deferred given limitations of phone visit.  Laboratory & Radiologic Studies: None new  Assessment & Plan: Lisa Osborn is a 55 y.o. woman with Stage IA adult-type granulosa cell tumor who presents for follow-up phone visit.   Patient is overall doing much better from a postoperative standpoint but seems to have an allergy to Dermabond.  I have recommended that she remove the Dermabond tonight using nail polish remover.  Alternatively, she could come into clinic tomorrow to get some adhesive remover.  I have also sent a prescription for a low-dose topical steroid that I would like her to use a couple of times a day around the incisions until redness improves.   Reviewed final  pathology again.  Cytology has now returned and is negative for malignant cells.  Pathology confirms stage IA GCT, adult-type. Will review case at tumor board, but given presumed size (10cm), unless features that increase her risk of recurrence are noted, my recommendation is for close surveillance.    I discussed the assessment and treatment plan with the patient. The patient was provided with an opportunity to ask questions and all were answered. The patient agreed with the plan and demonstrated an understanding of the instructions.   The patient was advised to call back or see an in-person evaluation if the symptoms worsen or if the condition fails to improve as anticipated.   18 minutes of total time was spent for this patient encounter, including preparation, face-to-face counseling with the patient and coordination of care, and documentation of the encounter.   Jeral Pinch, MD  Division of Gynecologic Oncology  Department of Obstetrics and Gynecology  [facility]

## 2021-03-16 ENCOUNTER — Other Ambulatory Visit: Payer: Self-pay | Admitting: Oncology

## 2021-03-16 ENCOUNTER — Telehealth: Payer: Self-pay | Admitting: Genetic Counselor

## 2021-03-16 ENCOUNTER — Other Ambulatory Visit: Payer: Self-pay

## 2021-03-16 ENCOUNTER — Encounter: Payer: Self-pay | Admitting: Genetic Counselor

## 2021-03-16 ENCOUNTER — Ambulatory Visit (INDEPENDENT_AMBULATORY_CARE_PROVIDER_SITE_OTHER): Payer: BC Managed Care – PPO

## 2021-03-16 DIAGNOSIS — E538 Deficiency of other specified B group vitamins: Secondary | ICD-10-CM | POA: Diagnosis not present

## 2021-03-16 DIAGNOSIS — Z1379 Encounter for other screening for genetic and chromosomal anomalies: Secondary | ICD-10-CM | POA: Insufficient documentation

## 2021-03-16 NOTE — Progress Notes (Signed)
Gynecologic Oncology Multi-Disciplinary Disposition Conference Note  Date of the Conference: 03/16/2021  Patient Name: Lisa Osborn  Referring Provider: Jonetta Osgood, NP Primary GYN Oncologist: Dr. Berline Lopes  Stage/Disposition:  Stage IA adult type granulosa cell tumor. Disposition is to close surveillance.   This Multidisciplinary conference took place involving physicians from [place], [place], Radiation Oncology, Pathology, Radiology along with the Gynecologic Oncology Nurse Practitioner and RN.  Comprehensive assessment of the patient's malignancy, staging, need for surgery, chemotherapy, radiation therapy, and need for further testing were reviewed. Supportive measures, both inpatient and following discharge were also discussed. The recommended plan of care is documented. Greater than 35 minutes were spent correlating and coordinating this patient's care. 10/24/

## 2021-03-16 NOTE — Telephone Encounter (Signed)
I contacted Ms. Taliaferro to discuss her genetic testing results. No pathogenic variants were identified in the 77 genes analyzed.   The test report has been scanned into EPIC and is located under the Molecular Pathology section of the Results Review tab.  A portion of the result report is included below for reference. Detailed clinic note to follow.  Lucille Passy, MS, [facility] Genetic [email or web address] [email or web address] (P) [phone]

## 2021-03-20 ENCOUNTER — Ambulatory Visit: Payer: Self-pay | Admitting: Genetic Counselor

## 2021-03-20 DIAGNOSIS — Z803 Family history of malignant neoplasm of breast: Secondary | ICD-10-CM

## 2021-03-20 NOTE — Progress Notes (Signed)
HPI:   Lisa Osborn was previously seen in the [facility] due to a family history of cancer and concerns regarding a hereditary predisposition to cancer. Please refer to our prior cancer genetics clinic note for more information regarding our discussion, assessment and recommendations, at the time. Lisa Osborn recent genetic test results were disclosed to her, as were recommendations warranted by these results. These results and recommendations are discussed in more detail below.  CANCER HISTORY:  Oncology History   No history exists.    FAMILY HISTORY:  We obtained a detailed, 4-generation family history.  Significant diagnoses are listed below:      Family History  Problem Relation Age of Onset   Breast cancer Mother     Hyperlipidemia Mother     Cancer Father          RCC, prostate, skin   Hyperlipidemia Father     Hypertension Father     Brain cancer Brother     Breast cancer Maternal Grandmother     Breast cancer Paternal Aunt     Breast cancer Cousin          pat cousin   Pancreatic cancer Paternal Uncle     Gastric cancer Paternal Uncle         Lisa Osborn's brother was diagnosed with brain cancer at 66 and died at 72 due to cancer metastasis. Her maternal grandmother was diagnosed with breast cancer at an unknown age, she is deceased. Of note, Lisa Osborn has limited information about her maternal family medical history.    Lisa Osborn father was diagnosed with melanoma once in his 57s and a second time in his 26s. He was also diagnosed with prostate cancer at 40 and had a prostatectomy. Additionally, he was diagnosed with renal cell cancer at 91 and died at 71. Her paternal uncle was diagnosed with a sarcoma "behind his stomach" at age 4, prostate cancer at 40 (he had a prostatectomy), and he was recently diagnosed with pancreatic cancer at 19 (he declined genetic testing). Her paternal aunt was diagnosed with melanoma and thyroid cancer in her 98s. A second  paternal aunt was diagnosed with breast cancer in her 14s and Non-Hodgkins lymphoma in her 12s. This aunt's daughter was diagnosed with breast cancer in her 53s. Lisa Osborn paternal grandmother and grandfather were diagnosed with lung cancer, they both smoked and are deceased.    Lisa Osborn is unaware of previous family history of genetic testing for hereditary cancer risks. There no reported Ashkenazi Jewish ancestry.    GENETIC TEST RESULTS:  The [facility] CancerNext-Expanded Panel found no pathogenic mutations.   The CancerNext-Expanded gene panel offered by [facility] and includes sequencing, rearrangement, and RNA analysis for the following 77 genes: AIP, ALK, APC, ATM, AXIN2, BAP1, BARD1, BLM, BMPR1A, BRCA1, BRCA2, BRIP1, CDC73, CDH1, CDK4, CDKN1B, CDKN2A, CHEK2, CTNNA1, DICER1, FANCC, FH, FLCN, GALNT12, KIF1B, LZTR1, MAX, MEN1, MET, MLH1, MSH2, MSH3, MSH6, MUTYH, NBN, NF1, NF2, NTHL1, PALB2, PHOX2B, PMS2, POT1, PRKAR1A, PTCH1, PTEN, RAD51C, RAD51D, RB1, RECQL, RET, SDHA, SDHAF2, SDHB, SDHC, SDHD, SMAD4, SMARCA4, SMARCB1, SMARCE1, STK11, SUFU, TMEM127, TP53, TSC1, TSC2, VHL and XRCC2 (sequencing and deletion/duplication); EGFR, EGLN1, HOXB13, KIT, MITF, PDGFRA, POLD1, and POLE (sequencing only); EPCAM and GREM1 (deletion/duplication only).    The test report has been scanned into EPIC and is located under the Molecular Pathology section of the Results Review tab.  A portion of the result report is included below for reference. Genetic testing reported  out on 03/10/2021.       Even though a pathogenic variant was not identified, possible explanations for the cancer in the family may include: There may be no hereditary risk for cancer in the family. The cancers in Lisa Osborn's family may be due to other genetic or environmental factors. There may be a gene mutation in one of these genes that current testing methods cannot detect, but that chance is small. There could be another gene that has  not yet been discovered, or that we have not yet tested, that is responsible for the cancer diagnoses in the family.  It is also possible there is a hereditary cause for the cancer in the family that Lisa Osborn did not inherit.  Therefore, it is important to remain in touch with cancer genetics in the future so that we can continue to offer Lisa Osborn the most up to date genetic testing.   ADDITIONAL GENETIC TESTING:  We discussed with Lisa Osborn that her genetic testing was fairly extensive.  If there are genes identified to increase cancer risk that can be analyzed in the future, we would be happy to discuss and coordinate this testing at that time.    CANCER SCREENING RECOMMENDATIONS:  Lisa Osborn test result is considered negative (normal).  This means that we have not identified a hereditary cause for her family history of cancer at this time.   An individual's cancer risk and medical management are not determined by genetic test results alone. Overall cancer risk assessment incorporates additional factors, including personal medical history, family history, and any available genetic information that may result in a personalized plan for cancer prevention and surveillance. Therefore, it is recommended she continue to follow the cancer management and screening guidelines provided by her healthcare providers.  RECOMMENDATIONS FOR FAMILY MEMBERS:   Since she did not inherit a mutation in a cancer predisposition gene included on this panel, her children could not have inherited a mutation from her in one of these genes. Individuals in this family might be at some increased risk of developing cancer, over the general population risk, due to the family history of cancer. We recommend women in this family have a yearly mammogram beginning at age 44, or 32 years younger than the earliest onset of cancer. Other members of the family may still carry a pathogenic variant in one of these genes that Lisa Osborn  did not inherit. Based on the family history, we recommend her paternal aunts and uncle have genetic counseling and testing.   FOLLOW-UP:  Cancer genetics is a rapidly advancing field and it is possible that new genetic tests will be appropriate for her and/or her family members in the future. We encouraged her to remain in contact with cancer genetics on an annual basis so we can update her personal and family histories and let her know of advances in cancer genetics that may benefit this family.   Our contact number was provided. Ms. Tusing questions were answered to her satisfaction, and she knows she is welcome to call us at anytime with additional questions or concerns.   Lisa Passy, MS, [facility] Genetic Counselor [email or web address] (P) [phone]

## 2021-03-23 ENCOUNTER — Other Ambulatory Visit: Payer: Self-pay

## 2021-03-23 ENCOUNTER — Inpatient Hospital Stay (HOSPITAL_BASED_OUTPATIENT_CLINIC_OR_DEPARTMENT_OTHER): Payer: BC Managed Care – PPO | Admitting: Gynecologic Oncology

## 2021-03-23 ENCOUNTER — Encounter: Payer: Self-pay | Admitting: Gynecologic Oncology

## 2021-03-23 VITALS — BP 128/81 | HR 93 | Temp 98.7°F | Resp 18 | Ht 63.0 in | Wt 135.1 lb

## 2021-03-23 DIAGNOSIS — D3912 Neoplasm of uncertain behavior of left ovary: Secondary | ICD-10-CM

## 2021-03-23 DIAGNOSIS — Z90722 Acquired absence of ovaries, bilateral: Secondary | ICD-10-CM

## 2021-03-23 NOTE — Progress Notes (Signed)
Gynecologic Oncology Return Clinic Visit  03/23/2021  Reason for Visit: follow-up after surgery  Treatment History: 10/11: Robotic BSO, mini-lap, omentectomy. Pathology shows adult granulosa cell tumor  Interval History: Reports overall doing well since surgery.  She denies any significant pelvic pain.  Has some pain related to her mini lap incision.  Denies any vaginal bleeding or discharge.  Reports having a good appetite without nausea or emesis.  Has ongoing fatigue.  Endorses normal bowel and bladder function.  Developed a pretty significant rash after surgery that seems to be in the distribution of ChloraPrep.  She has been using topical steroid cream with good improvement.  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   HLD (hyperlipidemia)    Hypertension     Past Surgical History:  Procedure Laterality Date   CESAREAN SECTION     CHOLECYSTECTOMY     COLONOSCOPY WITH PROPOFOL N/A 02/16/2021   Procedure: COLONOSCOPY WITH PROPOFOL;  Surgeon: Lin Landsman, MD;  Location: [facility] ENDOSCOPY;  Service: Gastroenterology;  Laterality: N/A;   NOSE SURGERY     Surgery for deviated septum   OTHER SURGICAL HISTORY     Mesh placement for bladder prolapse versus urinary incontinence   ROBOTIC ASSISTED BILATERAL SALPINGO OOPHERECTOMY N/A 03/03/2021   Procedure: XI ROBOTIC ASSISTED BILATERAL SALPINGO OOPHORECTOMY,MINI LAPAROTOMY AND OMENTECTOMY, VAGINAL MASS EXCISION;  Surgeon: Lafonda Mosses, MD;  Location: [facility] ORS;  Service: Gynecology;  Laterality: N/A;   VAGINAL HYSTERECTOMY      Family History  Problem Relation Age of Onset   Hyperlipidemia Mother    Hyperlipidemia Father    Hypertension Father    Renal cancer Father 55   Prostate cancer Father 2   Melanoma Father        dx. in 71s and a second time in his 10s   Brain cancer  Brother 94   Breast cancer Paternal Aunt        dx. 72s   Non-Hodgkin's lymphoma Paternal Aunt        dx. 43s   Thyroid cancer Paternal Aunt        dx. 63s   Melanoma Paternal Aunt        dx. 32s   Pancreatic cancer Paternal Uncle 57   Gastric cancer Paternal Uncle 69   Prostate cancer Paternal Uncle 70   Breast cancer Maternal Grandmother    Lung cancer Paternal Grandmother        smoked, dx. 31s   Lung cancer Paternal Grandfather        smoked, dx. 65s   Breast cancer Cousin        pat cousin, dx. 36s    Social History   Socioeconomic History   Marital status: Married    Spouse name: Not on file   Number of children: Not on file   Years of education: Not on file   Highest education level: Not on file  Occupational History   Not on file  Tobacco Use   Smoking status: Never    Passive exposure: Past   Smokeless tobacco: Never  Vaping Use   Vaping Use: Never used  Substance and Sexual Activity   Alcohol use: Yes    Comment: ocassionally   Drug use: Never   Sexual activity: Not on file  Other Topics Concern  Not on file  Social History Narrative   Not on file   Social Determinants of Health   Financial Resource Strain: Not on file  Food Insecurity: Not on file  Transportation Needs: Not on file  Physical Activity: Not on file  Stress: Not on file  Social Connections: Not on file    Current Medications:  Current Outpatient Medications:    amLODipine (NORVASC) 10 MG tablet, Take 1 tablet (10 mg total) by mouth daily., Disp: 90 tablet, Rfl: 1   butalbital-acetaminophen-caffeine (FIORICET) 50-325-40 MG tablet, Take 1 tablet by mouth every 6 (six) hours as needed for headache., Disp: 10 tablet, Rfl: 0   EPINEPHrine 0.3 mg/0.3 mL IJ SOAJ injection, Inject 0.3 mg into the muscle as needed for anaphylaxis., Disp: , Rfl:    hydrochlorothiazide (HYDRODIURIL) 12.5 MG tablet, Take 1 tablet (12.5 mg total) by mouth daily., Disp: 90 tablet, Rfl: 2   ibuprofen (ADVIL)  800 MG tablet, Take 1 tablet (800 mg total) by mouth every 8 (eight) hours as needed for moderate pain. For AFTER surgery only, Disp: 30 tablet, Rfl: 0   triamcinolone (KENALOG) 0.025 % ointment, Apply 1 application topically 2 (two) times daily., Disp: 30 g, Rfl: 0   venlafaxine XR (EFFEXOR-XR) 37.5 MG 24 hr capsule, TAKE 1 CAPSULE BY MOUTH DAILY WITH BREAKFAST., Disp: 90 capsule, Rfl: 1   zolpidem (AMBIEN) 10 MG tablet, Take 1 tablet (10 mg total) by mouth at bedtime as needed for sleep. (Patient taking differently: Take 10 mg by mouth at bedtime.), Disp: 30 tablet, Rfl: 2   ondansetron (ZOFRAN) 4 MG tablet, Take 1 tablet (4 mg total) by mouth every 8 (eight) hours as needed for nausea or vomiting. (Patient not taking: Reported on 03/23/2021), Disp: 5 tablet, Rfl: 0   senna-docusate (SENOKOT-S) 8.6-50 MG tablet, Take 2 tablets by mouth at bedtime. For AFTER surgery, do not take if having diarrhea (Patient not taking: Reported on 03/23/2021), Disp: 30 tablet, Rfl: 0   traMADol (ULTRAM) 50 MG tablet, Take 1 tablet (50 mg total) by mouth every 6 (six) hours as needed for severe pain. For AFTER surgery only, do not take and drive (Patient not taking: Reported on 03/23/2021), Disp: 10 tablet, Rfl: 0  Review of Systems: Pertinent positives include fatigue, incisional pain, itching Denies appetite changes, fevers, chills, unexplained weight changes. Denies hearing loss, neck lumps or masses, mouth sores, ringing in ears or voice changes. Denies cough or wheezing.  Denies shortness of breath. Denies chest pain or palpitations. Denies leg swelling. Denies abdominal distention, blood in stools, constipation, diarrhea, nausea, vomiting, or early satiety. Denies pain with intercourse, dysuria, frequency, hematuria or incontinence. Denies hot flashes, pelvic pain, vaginal bleeding or vaginal discharge.   Denies joint pain, back pain or muscle pain/cramps. Denies dizziness, headaches, numbness or  seizures. Denies swollen lymph nodes or glands, denies easy bruising or bleeding. Denies anxiety, depression, confusion, or decreased concentration.  Physical Exam: BP 128/81 (BP Location: Right Arm, Patient Position: Sitting)   Pulse 93   Temp 98.7 F (37.1 C) (Oral)   Resp 18   Ht 5\' 3"  (1.6 m)   Wt 135 lb 1.6 oz (61.3 kg)   SpO2 100%   BMI 23.93 kg/m  General: Alert, oriented, no acute distress. HEENT: Normocephalic, atraumatic, sclera anicteric. Chest: Unlabored breathing on room air. Abdomen: soft, nontender except with palpation at the superior aspect of her mini lap incision.  There is some fullness here that I suspect is either hematoma or seroma  that developed postoperatively.  Her incisions themselves are healing well without erythema, induration or exudate.  Normoactive bowel sounds.  Very minimal rash noted, mostly just around her mini lap incision with small red macules. Extremities: Grossly normal range of motion.  Warm, well perfused.  No edema bilaterally.  Laboratory & Radiologic Studies: Pathology from 10/11: A. OVARY AND FALLOPIAN TUBE, LEFT, SALPINGO OOPHORECTOMY:  - Left ovary      Granulosa cell tumor, adult type, 292 g.      See oncology table and comment.  - Left Fallopian tube      Unremarkable.      No endometriosis or malignancy.   B. OVARY AND FALLOPIAN TUBE, RIGHT, SALPINGO OOPHORECTOMY:  - Right ovary      Benign fibrotic, partially calcified nodule.      No endometriosis or malignancy.  - Right Fallopian tube      Unremarkable.      No endometriosis or malignancy.   C. OMENTUM, EXCISION:  - Benign adipose tissue consistent with omentum.  - No evidence of malignancy.   D. VAGINA, NODULE, BIOPSY:  - Benign fibroepithelial polyp.  - No dysplasia or malignancy.   Assessment & Plan: KEA CALLAN is a 55 y.o. 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  Patient is overall doing very well from surgery and meeting milestones.  We discussed continued expectations as well as postoperative restrictions.  I have encouraged her to continue using topical steroid cream until rash completely resolves.  She will keep me posted if this does not happen.  In terms of her incision, we discussed that I think this is likely a hematoma or seroma that developed and should improve with time.  She will let me know if she does not notice continued daily improvement.  I reviewed her final pathology and the patient was given a paper copy of her report.  Given early stage granulosa cell cancer confined to the ovary without risk factors, I do not recommend adjuvant treatment.  We discussed that follow-up will include surveillance visits every 6 months with an exam and lab test.  She will require long-term follow-up given the risk of recurrence at long intervals from original diagnosis.  We reviewed signs and symptoms that would be concerning for cancer recurrence, and I stressed the importance that she call if she develops any of these between visits.  28 minutes of total time was spent for this patient encounter, including preparation, face-to-face counseling with the patient and coordination of care, and documentation of the encounter.  Jeral Pinch, MD  Division of Gynecologic Oncology  Department of Obstetrics and Gynecology  [facility]

## 2021-03-23 NOTE — Patient Instructions (Signed)
You are healing well from surgery.  Please let me know if things do not continue to get better in terms of your bigger incision as well as your abdominal rash.  We will plan to do visits every 6 months for an exam and blood work.  If you develop any of the symptoms that we talked about today that would be concerning for cancer recurrence (pelvic pain, abdominal bloating, change to bowel function, unintentional weight loss), please call to see me sooner.

## 2021-04-01 ENCOUNTER — Encounter: Payer: Self-pay | Admitting: *Deleted

## 2021-04-01 ENCOUNTER — Telehealth: Payer: Self-pay | Admitting: Oncology

## 2021-04-01 ENCOUNTER — Encounter: Payer: Self-pay | Admitting: Nurse Practitioner

## 2021-04-01 ENCOUNTER — Telehealth: Payer: Self-pay | Admitting: *Deleted

## 2021-04-01 ENCOUNTER — Ambulatory Visit (INDEPENDENT_AMBULATORY_CARE_PROVIDER_SITE_OTHER): Payer: BC Managed Care – PPO | Admitting: Nurse Practitioner

## 2021-04-01 ENCOUNTER — Other Ambulatory Visit: Payer: Self-pay

## 2021-04-01 VITALS — BP 120/84 | HR 87 | Temp 98.0°F | Resp 16 | Ht 63.0 in | Wt 133.0 lb

## 2021-04-01 DIAGNOSIS — F5101 Primary insomnia: Secondary | ICD-10-CM | POA: Diagnosis not present

## 2021-04-01 DIAGNOSIS — G43019 Migraine without aura, intractable, without status migrainosus: Secondary | ICD-10-CM

## 2021-04-01 DIAGNOSIS — N951 Menopausal and female climacteric states: Secondary | ICD-10-CM | POA: Diagnosis not present

## 2021-04-01 DIAGNOSIS — I10 Essential (primary) hypertension: Secondary | ICD-10-CM

## 2021-04-01 MED ORDER — VENLAFAXINE HCL ER 75 MG PO CP24: 75.0000 mg | ORAL_CAPSULE | Freq: Every day | ORAL | 1 refills | Status: DC

## 2021-04-01 MED ORDER — ZOLPIDEM TARTRATE 10 MG PO TABS: 10.0000 mg | ORAL_TABLET | Freq: Every evening | ORAL | 2 refills | Status: DC | PRN

## 2021-04-01 NOTE — Telephone Encounter (Signed)
Patient requested a letter that stated the surgery she had and the pathology report. Per patient requested emailed her the letter and a copy of the op note and pathology report

## 2021-04-01 NOTE — Progress Notes (Signed)
[facility] [place], [place] [address]  Internal MEDICINE  Office Visit Note  Patient Name: Lisa Osborn  [id number]  [id number]  Date of Service: 04/01/2021  Chief Complaint  Patient presents with   Follow-up    Refills   Depression   Hyperlipidemia   Hypertension   Anxiety    HPI Lisa Osborn presents for a follow-up visit for medication refills.  Previously a tumor was found and she was sent to a gynecologic oncologist.  She had surgery in October to remove the tumor and it was successful.  She has been doing well recovering from surgery.  She is due for her mammogram in December and colonoscopy was normal.  She has been taking venlafaxine and is interested in having the dose increase.     Current Medication: Outpatient Encounter Medications as of 04/01/2021  Medication Sig Note   amLODipine (NORVASC) 10 MG tablet Take 1 tablet (10 mg total) by mouth daily.    EPINEPHrine 0.3 mg/0.3 mL IJ SOAJ injection Inject 0.3 mg into the muscle as needed for anaphylaxis. 03/23/2021: Prn    hydrochlorothiazide (HYDRODIURIL) 12.5 MG tablet Take 1 tablet (12.5 mg total) by mouth daily.    ibuprofen (ADVIL) 800 MG tablet Take 1 tablet (800 mg total) by mouth every 8 (eight) hours as needed for moderate pain. For AFTER surgery only 03/23/2021: Prn    venlafaxine XR (EFFEXOR-XR) 75 MG 24 hr capsule Take 1 capsule (75 mg total) by mouth daily with breakfast.    [DISCONTINUED] venlafaxine (EFFEXOR) 37.5 MG tablet Take 37.5 mg by mouth daily.    [DISCONTINUED] zolpidem (AMBIEN) 10 MG tablet Take 1 tablet (10 mg total) by mouth at bedtime as needed for sleep. (Patient taking differently: Take 10 mg by mouth at bedtime.)    zolpidem (AMBIEN) 10 MG tablet Take 1 tablet (10 mg total) by mouth at bedtime as needed for sleep.    [DISCONTINUED] butalbital-acetaminophen-caffeine (FIORICET) 50-325-40 MG tablet Take 1 tablet by mouth every 6 (six) hours as needed for headache. 03/23/2021:  prn   [DISCONTINUED] ondansetron (ZOFRAN) 4 MG tablet Take 1 tablet (4 mg total) by mouth every 8 (eight) hours as needed for nausea or vomiting. (Patient not taking: Reported on 03/23/2021)    [DISCONTINUED] senna-docusate (SENOKOT-S) 8.6-50 MG tablet Take 2 tablets by mouth at bedtime. For AFTER surgery, do not take if having diarrhea (Patient not taking: Reported on 03/23/2021) 02/17/2021: For use post procedure   [DISCONTINUED] traMADol (ULTRAM) 50 MG tablet Take 1 tablet (50 mg total) by mouth every 6 (six) hours as needed for severe pain. For AFTER surgery only, do not take and drive (Patient not taking: Reported on 03/23/2021) 02/17/2021: For use post procedure   [DISCONTINUED] triamcinolone (KENALOG) 0.025 % ointment Apply 1 application topically 2 (two) times daily.    [DISCONTINUED] venlafaxine XR (EFFEXOR-XR) 37.5 MG 24 hr capsule TAKE 1 CAPSULE BY MOUTH DAILY WITH BREAKFAST.    No facility-administered encounter medications on file as of 04/01/2021.    Surgical History: Past Surgical History:  Procedure Laterality Date   CESAREAN SECTION     CHOLECYSTECTOMY     COLONOSCOPY WITH PROPOFOL N/A 02/16/2021   Procedure: COLONOSCOPY WITH PROPOFOL;  Surgeon: Lisa Landsman, MD;  Location: [facility] ENDOSCOPY;  Service: Gastroenterology;  Laterality: N/A;   NOSE SURGERY     Surgery for deviated septum   OTHER SURGICAL HISTORY     Mesh placement for bladder prolapse versus urinary incontinence   ROBOTIC ASSISTED  BILATERAL SALPINGO OOPHERECTOMY N/A 03/03/2021   Procedure: XI ROBOTIC ASSISTED BILATERAL SALPINGO OOPHORECTOMY,MINI LAPAROTOMY AND OMENTECTOMY, VAGINAL MASS EXCISION;  Surgeon: Lafonda Mosses, MD;  Location: [facility] ORS;  Service: Gynecology;  Laterality: N/A;   VAGINAL HYSTERECTOMY      Medical History: Past Medical History:  Diagnosis Date   Allergy    Anxiety    Depression    Gout 2020   none since then   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 in 59s and a second time in his 26s   Brain cancer Brother 40   Breast cancer Paternal Aunt        dx. 28s   Non-Hodgkin's lymphoma Paternal Aunt        dx. 22s   Thyroid cancer Paternal Aunt        dx. 81s   Melanoma Paternal Aunt        dx. 3s   Pancreatic cancer Paternal Uncle 6   Gastric cancer Paternal Uncle 49   Prostate cancer Paternal Uncle 28   Breast cancer Maternal Grandmother    Lung cancer Paternal Grandmother        smoked, dx. 37s   Lung cancer Paternal Grandfather        smoked, dx. 71s   Breast cancer Cousin        pat cousin, dx. 76s    Social History   Socioeconomic History   Marital status: Married    Spouse name: Not on file   Number of children: Not on file   Years of education: Not on file   Highest education level: Not on file  Occupational History   Not on file  Tobacco Use   Smoking status: Never    Passive exposure: Past   Smokeless tobacco: Never  Vaping Use   Vaping Use: Never used  Substance and Sexual Activity   Alcohol use: Yes    Comment: ocassionally   Drug use: Never   Sexual activity: Not on file  Other Topics Concern   Not on file  Social History Narrative   Not on file   Social Determinants of Health   Financial Resource Strain: Not on file  Food Insecurity: Not on file  Transportation Needs: Not on file  Physical Activity: Not on file  Stress: Not on file  Social Connections: Not on file  Intimate Partner Violence: Not on file      Review of Systems  Constitutional:  Negative for chills, fatigue and unexpected weight change.  HENT:  Negative for congestion, rhinorrhea, sneezing and sore throat.   Eyes:  Negative for redness.  Respiratory:  Negative  for cough, chest tightness and shortness of breath.   Cardiovascular:  Negative for chest pain and palpitations.  Gastrointestinal:  Negative for abdominal pain, constipation, diarrhea, nausea and vomiting.  Genitourinary:  Negative for dysuria and frequency.  Musculoskeletal:  Negative for arthralgias, back pain, joint swelling and neck pain.  Skin:  Negative for rash.  Neurological: Negative.  Negative for tremors and numbness.  Hematological:  Negative for adenopathy. Does not bruise/bleed easily.  Psychiatric/Behavioral:  Negative for behavioral problems (Depression), sleep disturbance  and suicidal ideas. The patient is not nervous/anxious.    Vital Signs: BP 120/84   Pulse 87   Temp 98 F (36.7 C)   Resp 16   Ht 5\' 3"  (1.6 m)   Wt 133 lb (60.3 kg)   SpO2 99%   BMI 23.56 kg/m    Physical Exam Vitals reviewed.  Constitutional:      General: She is not in acute distress.    Appearance: Normal appearance. She is normal weight. She is not ill-appearing.  HENT:     Head: Normocephalic and atraumatic.  Eyes:     Pupils: Pupils are equal, round, and reactive to light.  Cardiovascular:     Rate and Rhythm: Normal rate and regular rhythm.  Pulmonary:     Effort: Pulmonary effort is normal. No respiratory distress.  Neurological:     Mental Status: She is alert and oriented to person, place, and time.     Cranial Nerves: No cranial nerve deficit.     Coordination: Coordination normal.     Gait: Gait normal.  Psychiatric:        Mood and Affect: Mood normal.        Behavior: Behavior normal.       Assessment/Plan: 1. Essential hypertension Stable, continue medications as prescribed.   2. Vasomotor symptoms due to menopause Venlafaxine dose increased.  - venlafaxine XR (EFFEXOR-XR) 75 MG 24 hr capsule; Take 1 capsule (75 mg total) by mouth daily with breakfast.  Dispense: 90 capsule; Refill: 1  3. Intractable migraine without aura and without status  migrainosus Venlafaxine dose increased.  - venlafaxine XR (EFFEXOR-XR) 75 MG 24 hr capsule; Take 1 capsule (75 mg total) by mouth daily with breakfast.  Dispense: 90 capsule; Refill: 1  4. Primary insomnia Refills ordered - zolpidem (AMBIEN) 10 MG tablet; Take 1 tablet (10 mg total) by mouth at bedtime as needed for sleep.  Dispense: 30 tablet; Refill: 2   General Counseling: Jadis verbalizes understanding of the findings of todays visit and agrees with plan of treatment. I have discussed any further diagnostic evaluation that may be needed or ordered today. We also reviewed her medications today. she has been encouraged to call the office with any questions or concerns that should arise related to todays visit.    No orders of the defined types were placed in this encounter.   Meds ordered this encounter  Medications   zolpidem (AMBIEN) 10 MG tablet    Sig: Take 1 tablet (10 mg total) by mouth at bedtime as needed for sleep.    Dispense:  30 tablet    Refill:  2    Please do not run with insurance. Will have [place] card.   venlafaxine XR (EFFEXOR-XR) 75 MG 24 hr capsule    Sig: Take 1 capsule (75 mg total) by mouth daily with breakfast.    Dispense:  90 capsule    Refill:  1    Return in about 3 months (around 07/02/2021) for F/U, med refill, Nat Lowenthal PCP.   Total time spent:30 Minutes Time spent includes review of chart, medications, test results, and follow up plan with the patient.   [place] Controlled Substance Database was reviewed by me.  This patient was seen by Jonetta Osgood, FNP-C in collaboration with Dr. Clayborn Bigness as a part of collaborative care agreement.   Matasha Smigelski R. Valetta Fuller, MSN, FNP-C Internal medicine

## 2021-04-01 NOTE — Telephone Encounter (Signed)
Called Lisa Osborn to see how she is feeling after surgery.  She said she is getting better.  She denies pain except for shooting pains in her left buttock area when walking.  She denies any bowel or bladder issues.  Her incision looks good and her rash is gone.    She also said she needs documentation signed by Dr. Berline Lopes for her cancer policy.  She needs what surgery was performed and would like it emailed to her if possible.

## 2021-04-01 NOTE — Telephone Encounter (Signed)
Called Tahani back and let her know that the pain will hopefully stabilize with more movement.  Advised her to reach out to her PCP if it does not improve per Dr. Berline Lopes.

## 2021-04-20 ENCOUNTER — Ambulatory Visit (INDEPENDENT_AMBULATORY_CARE_PROVIDER_SITE_OTHER): Payer: BC Managed Care – PPO

## 2021-04-20 ENCOUNTER — Other Ambulatory Visit: Payer: Self-pay

## 2021-04-20 DIAGNOSIS — E538 Deficiency of other specified B group vitamins: Secondary | ICD-10-CM | POA: Diagnosis not present

## 2021-04-20 MED ORDER — CYANOCOBALAMIN 1000 MCG/ML IJ SOLN
1000.0000 ug | Freq: Once | INTRAMUSCULAR | Status: AC
  Administered 2021-04-20: 10:00:00 1000 ug via INTRAMUSCULAR

## 2021-04-27 ENCOUNTER — Telehealth: Payer: Self-pay

## 2021-04-27 NOTE — Telephone Encounter (Signed)
Patient called stated she has possible hernia in her surgery site. She was unsure as who to go see. I told her she needs to call her surgeon, and he states she needs to come here, then we will get her an appointment-Toni

## 2021-04-28 ENCOUNTER — Encounter: Payer: Self-pay | Admitting: Gynecologic Oncology

## 2021-04-28 ENCOUNTER — Telehealth: Payer: Self-pay | Admitting: Oncology

## 2021-04-28 NOTE — Telephone Encounter (Signed)
Called Tashe regarding her [employer].  She said the abdominal pain has gotten worse over the past week and she feels like she has "ripped" something.  She said it is above the belly button incision and to the right.  She feels a small area of hardness but said it is not bulging.  It is very painful when she pushes on the area.  She also has throbbing pain when she is walking and standing still.  It also hurts when she is resting and she is having trouble sleeping.  She is taking ibuprofen 800 mg q 8 hours and said it takes the edge off for a little while.    She is very active now taking care of her grandchildren and is also working.  She works at [employer] so she is on her feet a lot and does some lifting of boxes.    She denies having any nausea and vomiting.  She is passing gas and is having bowel movements.  She mentioned that she does have the same pain with bowel movements.  She has started a stool softener to see if that will help.

## 2021-04-29 ENCOUNTER — Other Ambulatory Visit: Payer: Self-pay | Admitting: Gynecologic Oncology

## 2021-04-29 DIAGNOSIS — R1084 Generalized abdominal pain: Secondary | ICD-10-CM

## 2021-04-29 NOTE — Telephone Encounter (Signed)
Called Lisa Osborn back with CT apt on 05/08/21 at 3:30 (NPO 4 hours before, pick up prep at the cancer center or arrive 2 hours early, drink 1st bottle of contrast at 1:30, 2nd bottle at 2:30).  She verbalized understanding and agreement.

## 2021-05-08 ENCOUNTER — Inpatient Hospital Stay: Payer: BC Managed Care – PPO | Attending: Gynecologic Oncology

## 2021-05-08 ENCOUNTER — Ambulatory Visit (HOSPITAL_COMMUNITY)
Admission: RE | Admit: 2021-05-08 | Discharge: 2021-05-08 | Disposition: A | Discharge status: Home / Self Care | Payer: BC Managed Care – PPO | Source: Ambulatory Visit | Attending: Gynecologic Oncology | Admitting: Gynecologic Oncology

## 2021-05-08 ENCOUNTER — Other Ambulatory Visit: Payer: Self-pay

## 2021-05-08 ENCOUNTER — Telehealth: Payer: Self-pay | Admitting: Oncology

## 2021-05-08 DIAGNOSIS — D3912 Neoplasm of uncertain behavior of left ovary: Secondary | ICD-10-CM

## 2021-05-08 DIAGNOSIS — R1084 Generalized abdominal pain: Secondary | ICD-10-CM | POA: Insufficient documentation

## 2021-05-08 LAB — BASIC METABOLIC PANEL - CANCER CENTER ONLY
Anion gap: 7 (ref 5–15)
BUN: 14 mg/dL (ref 6–20)
CO2: 27 mmol/L (ref 22–32)
Calcium: 9.1 mg/dL (ref 8.9–10.3)
Chloride: 106 mmol/L (ref 98–111)
Creatinine: 0.7 mg/dL (ref 0.44–1.00)
GFR, Estimated: >60 mL/min (ref >60)
Glucose, Bld: 98 mg/dL (ref 70–99)
Potassium: 4.2 mmol/L (ref 3.5–5.1)
Sodium: 140 mmol/L (ref 135–145)

## 2021-05-08 MED ORDER — IOHEXOL 9 MG/ML PO SOLN
ORAL | Status: AC
  Filled 2021-05-08: qty 1000

## 2021-05-08 MED ORDER — IOHEXOL 350 MG/ML SOLN
80.0000 mL | Freq: Once | INTRAVENOUS | Status: AC | PRN
  Administered 2021-05-08: 80 mL via INTRAVENOUS

## 2021-05-08 MED ORDER — IOHEXOL 9 MG/ML PO SOLN: 1000.0000 mL | ORAL | Status: AC

## 2021-05-08 NOTE — Telephone Encounter (Signed)
Called Lisa Osborn and scheduled lab appointment today at 1:00 before her CT scan.  She does not have the prep yet so will arrive 2 hours early for the CT scan.

## 2021-05-11 ENCOUNTER — Telehealth: Payer: Self-pay

## 2021-05-11 NOTE — Telephone Encounter (Signed)
Following up with Ms. Lisa Osborn. Spoke with patient this morning. She states she saw the CT results and message from Dr. Berline Lopes on [place]. She reports feeling better but has some discomfort that comes and goes. She rates her pain as 4/10, tylenol, ibuprofen, and a heating pad help with discomfort. She has occasional constipation for which she takes a stool softener. She has been eating and drinking well. She states she feels better overall and is on the mend. Instructed patient to call with any questions or concerns.

## 2021-05-20 DIAGNOSIS — E538 Deficiency of other specified B group vitamins: Secondary | ICD-10-CM | POA: Diagnosis not present

## 2021-05-20 MED ORDER — CYANOCOBALAMIN 1000 MCG/ML IJ SOLN
1000.0000 ug | Freq: Once | INTRAMUSCULAR | Status: AC
  Administered 2021-05-20: 12:00:00 1000 ug via INTRAMUSCULAR

## 2021-06-24 ENCOUNTER — Ambulatory Visit: Payer: BC Managed Care – PPO | Admitting: Nurse Practitioner

## 2021-06-26 ENCOUNTER — Ambulatory Visit (INDEPENDENT_AMBULATORY_CARE_PROVIDER_SITE_OTHER): Payer: BC Managed Care – PPO | Admitting: Nurse Practitioner

## 2021-06-26 ENCOUNTER — Other Ambulatory Visit: Payer: Self-pay

## 2021-06-26 ENCOUNTER — Encounter: Payer: Self-pay | Admitting: Nurse Practitioner

## 2021-06-26 VITALS — BP 122/84 | HR 78 | Temp 98.1°F | Resp 16 | Wt 133.0 lb

## 2021-06-26 DIAGNOSIS — E538 Deficiency of other specified B group vitamins: Secondary | ICD-10-CM | POA: Diagnosis not present

## 2021-06-26 DIAGNOSIS — G5603 Carpal tunnel syndrome, bilateral upper limbs: Secondary | ICD-10-CM | POA: Diagnosis not present

## 2021-06-26 DIAGNOSIS — F5101 Primary insomnia: Secondary | ICD-10-CM

## 2021-06-26 DIAGNOSIS — N951 Menopausal and female climacteric states: Secondary | ICD-10-CM | POA: Diagnosis not present

## 2021-06-26 MED ORDER — VENLAFAXINE HCL ER 150 MG PO CP24: 150.0000 mg | ORAL_CAPSULE | Freq: Every day | ORAL | 2 refills | Status: DC

## 2021-06-26 MED ORDER — ZOLPIDEM TARTRATE 10 MG PO TABS: 10.0000 mg | ORAL_TABLET | Freq: Every evening | ORAL | 2 refills | Status: DC | PRN

## 2021-06-26 MED ORDER — CYANOCOBALAMIN 1000 MCG/ML IJ SOLN
1000.0000 ug | Freq: Once | INTRAMUSCULAR | Status: AC
  Administered 2021-06-26: 1000 ug via INTRAMUSCULAR

## 2021-06-26 NOTE — Progress Notes (Signed)
[facility] [place], [place] [address]  Internal MEDICINE  Office Visit Note  Patient Name: Lisa Osborn  [id number]  [id number]  Date of Service: 06/26/2021  Chief Complaint  Patient presents with   Follow-up    Wants b12, discuss menopause    Hyperlipidemia   Hypertension   Depression   Anxiety   Medication Refill    HPI Elizah presents for follow-up visit for hypertension, perimenopause, B12 deficiency and medication refills.  Her blood pressure is stable today.  She continues to do well since her surgery when the pelvic mass was removed.  She continues to follow-up with gynecologic oncology.  She is interested in increasing her dose of venlafaxine to control the perimenopausal symptoms.  She has also been dealing with carpal tunnel syndrome and both wrists.    Current Medication: Outpatient Encounter Medications as of 06/26/2021  Medication Sig Note   amLODipine (NORVASC) 10 MG tablet Take 1 tablet (10 mg total) by mouth daily.    EPINEPHrine 0.3 mg/0.3 mL IJ SOAJ injection Inject 0.3 mg into the muscle as needed for anaphylaxis. 03/23/2021: Prn    hydrochlorothiazide (HYDRODIURIL) 12.5 MG tablet Take 1 tablet (12.5 mg total) by mouth daily.    ibuprofen (ADVIL) 800 MG tablet Take 1 tablet (800 mg total) by mouth every 8 (eight) hours as needed for moderate pain. For AFTER surgery only 03/23/2021: Prn    venlafaxine XR (EFFEXOR-XR) 150 MG 24 hr capsule Take 1 capsule (150 mg total) by mouth daily with breakfast.    [DISCONTINUED] venlafaxine XR (EFFEXOR-XR) 75 MG 24 hr capsule Take 1 capsule (75 mg total) by mouth daily with breakfast.    [DISCONTINUED] zolpidem (AMBIEN) 10 MG tablet Take 1 tablet (10 mg total) by mouth at bedtime as needed for sleep.    zolpidem (AMBIEN) 10 MG tablet Take 1 tablet (10 mg total) by mouth at bedtime as needed for sleep.    [EXPIRED] cyanocobalamin ((VITAMIN B-12)) injection 1,000 mcg     No facility-administered  encounter medications on file as of 06/26/2021.    Surgical History: Past Surgical History:  Procedure Laterality Date   CESAREAN SECTION     CHOLECYSTECTOMY     COLONOSCOPY WITH PROPOFOL N/A 02/16/2021   Procedure: COLONOSCOPY WITH PROPOFOL;  Surgeon: Lin Landsman, MD;  Location: [facility] ENDOSCOPY;  Service: Gastroenterology;  Laterality: N/A;   NOSE SURGERY     Surgery for deviated septum   OTHER SURGICAL HISTORY     Mesh placement for bladder prolapse versus urinary incontinence   ROBOTIC ASSISTED BILATERAL SALPINGO OOPHERECTOMY N/A 03/03/2021   Procedure: XI ROBOTIC ASSISTED BILATERAL SALPINGO OOPHORECTOMY,MINI LAPAROTOMY AND OMENTECTOMY, VAGINAL MASS EXCISION;  Surgeon: Lafonda Mosses, MD;  Location: [facility] ORS;  Service: Gynecology;  Laterality: N/A;   VAGINAL HYSTERECTOMY      Medical History: Past Medical History:  Diagnosis Date   Allergy    Anxiety    Depression    Gout 2020   none since then   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 in 39s and a second time in his 80s   Brain cancer Brother 70  Breast cancer Paternal Aunt        dx. 46s   Non-Hodgkin's lymphoma Paternal Aunt        dx. 54s   Thyroid cancer Paternal Aunt        dx. 66s   Melanoma Paternal Aunt        dx. 37s   Pancreatic cancer Paternal Uncle 71   Gastric cancer Paternal Uncle 31   Prostate cancer Paternal Uncle 49   Breast cancer Maternal Grandmother    Lung cancer Paternal Grandmother        smoked, dx. 64s   Lung cancer Paternal Grandfather        smoked, dx. 59s   Breast cancer Cousin        pat cousin, dx. 65s    Social History   Socioeconomic History   Marital status: Married    Spouse name: Not on file    Number of children: Not on file   Years of education: Not on file   Highest education level: Not on file  Occupational History   Not on file  Tobacco Use   Smoking status: Never    Passive exposure: Past   Smokeless tobacco: Never  Vaping Use   Vaping Use: Never used  Substance and Sexual Activity   Alcohol use: Yes    Comment: ocassionally   Drug use: Never   Sexual activity: Not on file  Other Topics Concern   Not on file  Social History Narrative   Not on file   Social Determinants of Health   Financial Resource Strain: Not on file  Food Insecurity: Not on file  Transportation Needs: Not on file  Physical Activity: Not on file  Stress: Not on file  Social Connections: Not on file  Intimate Partner Violence: Not on file      Review of Systems  Constitutional:  Negative for chills, fatigue and unexpected weight change.  HENT:  Negative for congestion, rhinorrhea, sneezing and sore throat.   Eyes:  Negative for redness.  Respiratory: Negative.  Negative for cough, chest tightness, shortness of breath and wheezing.   Cardiovascular: Negative.  Negative for chest pain and palpitations.  Gastrointestinal:  Negative for abdominal pain, constipation, diarrhea, nausea and vomiting.  Genitourinary:  Negative for dysuria and frequency.  Musculoskeletal:  Negative for arthralgias, back pain, joint swelling and neck pain.  Skin:  Negative for rash.  Neurological: Negative.  Negative for tremors and numbness.  Hematological:  Negative for adenopathy. Does not bruise/bleed easily.  Psychiatric/Behavioral:  Negative for behavioral problems (Depression), sleep disturbance and suicidal ideas. The patient is not nervous/anxious.    Vital Signs: BP 122/84    Pulse 78    Temp 98.1 F (36.7 C)    Resp 16    Wt 133 lb (60.3 kg)    SpO2 99%    BMI 23.56 kg/m    Physical Exam Vitals reviewed.  Constitutional:      General: She is not in acute distress.    Appearance: Normal  appearance. She is normal weight. She is not ill-appearing.  HENT:     Head: Normocephalic and atraumatic.  Eyes:     Pupils: Pupils are equal, round, and reactive to light.  Cardiovascular:     Rate and Rhythm: Normal rate and regular rhythm.  Pulmonary:     Effort: Pulmonary effort is normal. No respiratory distress.  Neurological:     Mental Status: She is alert and oriented to person, place, and time.  Psychiatric:        Mood and Affect: Mood normal.        Behavior: Behavior normal.       Assessment/Plan: 1. Vasomotor symptoms due to menopause Venlafaxine dose increased.  - venlafaxine XR (EFFEXOR-XR) 150 MG 24 hr capsule; Take 1 capsule (150 mg total) by mouth daily with breakfast.  Dispense: 30 capsule; Refill: 2  2. B12 deficiency B12 injection administered in office today. - cyanocobalamin ((VITAMIN B-12)) injection 1,000 mcg  3. Bilateral carpal tunnel syndrome She has symptoms of bilateral carpal tunnel syndrome. She is currently   4. Primary insomnia Stable, refills ordered.  - zolpidem (AMBIEN) 10 MG tablet; Take 1 tablet (10 mg total) by mouth at bedtime as needed for sleep.  Dispense: 30 tablet; Refill: 2   General Counseling: Gustavia verbalizes understanding of the findings of todays visit and agrees with plan of treatment. I have discussed any further diagnostic evaluation that may be needed or ordered today. We also reviewed her medications today. she has been encouraged to call the office with any questions or concerns that should arise related to todays visit.    No orders of the defined types were placed in this encounter.   Meds ordered this encounter  Medications   venlafaxine XR (EFFEXOR-XR) 150 MG 24 hr capsule    Sig: Take 1 capsule (150 mg total) by mouth daily with breakfast.    Dispense:  30 capsule    Refill:  2   zolpidem (AMBIEN) 10 MG tablet    Sig: Take 1 tablet (10 mg total) by mouth at bedtime as needed for sleep.    Dispense:  30  tablet    Refill:  2    Please do not run with insurance. Will have [facility] card.   cyanocobalamin ((VITAMIN B-12)) injection 1,000 mcg    Return in about 3 months (around 09/23/2021) for F/U, med refill, Bunnie Lederman PCP ambien.   Total time spent:30 Minutes Time spent includes review of chart, medications, test results, and follow up plan with the patient.   [place] Controlled Substance Database was reviewed by me.  This patient was seen by Jonetta Osgood, FNP-C in collaboration with Dr. Clayborn Bigness as a part of collaborative care agreement.   Shawnn Bouillon R. Valetta Fuller, MSN, FNP-C Internal medicine

## 2021-07-18 ENCOUNTER — Encounter: Payer: Self-pay | Admitting: Nurse Practitioner

## 2021-07-30 ENCOUNTER — Other Ambulatory Visit: Payer: Self-pay | Admitting: Nurse Practitioner

## 2021-07-30 DIAGNOSIS — N951 Menopausal and female climacteric states: Secondary | ICD-10-CM

## 2021-08-24 ENCOUNTER — Encounter: Payer: Self-pay | Admitting: Gynecologic Oncology

## 2021-08-24 ENCOUNTER — Other Ambulatory Visit: Payer: Self-pay

## 2021-08-24 ENCOUNTER — Inpatient Hospital Stay: Payer: BC Managed Care – PPO

## 2021-08-24 ENCOUNTER — Inpatient Hospital Stay: Payer: BC Managed Care – PPO | Attending: Gynecologic Oncology | Admitting: Gynecologic Oncology

## 2021-08-24 VITALS — BP 152/78 | HR 90 | Temp 98.0°F | Resp 18 | Ht 63.0 in | Wt 139.3 lb

## 2021-08-24 DIAGNOSIS — Z90722 Acquired absence of ovaries, bilateral: Secondary | ICD-10-CM | POA: Insufficient documentation

## 2021-08-24 DIAGNOSIS — D3912 Neoplasm of uncertain behavior of left ovary: Secondary | ICD-10-CM

## 2021-08-24 DIAGNOSIS — N951 Menopausal and female climacteric states: Secondary | ICD-10-CM | POA: Insufficient documentation

## 2021-08-24 DIAGNOSIS — R140 Abdominal distension (gaseous): Secondary | ICD-10-CM | POA: Diagnosis not present

## 2021-08-24 DIAGNOSIS — N941 Unspecified dyspareunia: Secondary | ICD-10-CM | POA: Diagnosis not present

## 2021-08-24 DIAGNOSIS — R232 Flushing: Secondary | ICD-10-CM

## 2021-08-24 LAB — BASIC METABOLIC PANEL - CANCER CENTER ONLY
Anion gap: 6 (ref 5–15)
BUN: 13 mg/dL (ref 6–20)
CO2: 31 mmol/L (ref 22–32)
Calcium: 10.2 mg/dL (ref 8.9–10.3)
Chloride: 102 mmol/L (ref 98–111)
Creatinine: 0.58 mg/dL (ref 0.44–1.00)
GFR, Estimated: >60 mL/min (ref >60)
Glucose, Bld: 104 mg/dL — ABNORMAL HIGH (ref 70–99)
Potassium: 4.1 mmol/L (ref 3.5–5.1)
Sodium: 139 mmol/L (ref 135–145)

## 2021-08-24 NOTE — Progress Notes (Signed)
Gynecologic Oncology Return Clinic Visit ? ?/3/23 ? ?Reason for Visit: Follow-up in the setting of history of granulosa cell tumor ? ?Treatment History: ?10/11: Robotic BSO, mini-lap, omentectomy. Pathology shows adult granulosa cell tumor ?Due to pain postoperatively, patient had a CT scan on 05/08/2021 to rule out intra-abdominal process and development of a incisional hernia.  No acute findings were noted on this scan, small stable umbilical hernia seen. ? ?Interval History: ?Patient reports overall struggling with some menopausal symptoms.  She has had some increased weight as well as hot flashes.  She is on Effexor, although does not think this is helped much with her hot flashes.  She notes couple months of abdominal bloating.  Has intermittent constipation, but thinks bloating is unrelated and has been present since prior to her constipation.  Denies any vaginal bleeding or discharge.  Denies any urinary symptoms. ? ?Past Medical/Surgical History: ?Past Medical History:  ?Diagnosis Date  ? Allergy   ? Anxiety   ? Depression   ? Gout 2020  ? none since then  ? 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 HLD (hyperlipidemia)   ? Hypertension   ? ? ?Past Surgical History:  ?Procedure Laterality Date  ? CESAREAN SECTION    ? CHOLECYSTECTOMY    ? COLONOSCOPY WITH PROPOFOL N/A 02/16/2021  ? Procedure: COLONOSCOPY WITH PROPOFOL;  Surgeon: Lin Landsman, MD;  Location: [facility] ENDOSCOPY;  Service: Gastroenterology;  Laterality: N/A;  ? NOSE SURGERY    ? Surgery for deviated septum  ? OTHER SURGICAL HISTORY    ? Mesh placement for bladder prolapse versus urinary incontinence  ? ROBOTIC ASSISTED BILATERAL SALPINGO OOPHERECTOMY N/A 03/03/2021  ? Procedure: XI ROBOTIC ASSISTED BILATERAL SALPINGO OOPHORECTOMY,MINI LAPAROTOMY AND OMENTECTOMY, VAGINAL MASS EXCISION;  Surgeon: Lafonda Mosses, MD;  Location: [facility] ORS;  Service: Gynecology;  Laterality: N/A;  ? VAGINAL HYSTERECTOMY    ? ? ?Family History  ?Problem  Relation Age of Onset  ? Hyperlipidemia Mother   ? Hyperlipidemia Father   ? Hypertension Father   ? Renal cancer Father 82  ? Prostate cancer Father 55  ? Melanoma Father   ?     dx. in 53s and a second time in his 31s  ? Brain cancer Brother 4  ? Breast cancer Paternal Aunt   ?     dx. 80s  ? Non-Hodgkin's lymphoma Paternal Aunt   ?     dx. 58s  ? Thyroid cancer Paternal Aunt   ?     dx. 23s  ? Melanoma Paternal Aunt   ?     dx. 85s  ? Pancreatic cancer Paternal Uncle 58  ? Gastric cancer Paternal Uncle 25  ? Prostate cancer Paternal Uncle 44  ? Breast cancer Maternal Grandmother   ? Lung cancer Paternal Grandmother   ?     smoked, dx. 69s  ? Lung cancer Paternal Grandfather   ?     smoked, dx. 58s  ? Breast cancer Cousin   ?     pat cousin, dx. 75s  ? ? ?Social History  ? ?Socioeconomic History  ? Marital status: Married  ?  Spouse name: Not on file  ? Number of children: Not on file  ? Years of education: Not on file  ? Highest education level: Not on file  ?Occupational History  ? Not on file  ?Tobacco Use  ? Smoking status: Never  ?  Passive exposure: Past  ? Smokeless tobacco: Never  ?Vaping Use  ?  Vaping Use: Never used  ?Substance and Sexual Activity  ? Alcohol use: Yes  ?  Comment: ocassionally  ? Drug use: Never  ? Sexual activity: Not on file  ?Other Topics Concern  ? Not on file  ?Social History Narrative  ? Not on file  ? ?Social Determinants of Health  ? ?Financial Resource Strain: Not on file  ?Food Insecurity: Not on file  ?Transportation Needs: Not on file  ?Physical Activity: Not on file  ?Stress: Not on file  ?Social Connections: Not on file  ? ? ?Current Medications: ? ?Current Outpatient Medications:  ?  amLODipine (NORVASC) 10 MG tablet, Take 1 tablet (10 mg total) by mouth daily., Disp: 90 tablet, Rfl: 1 ?  EPINEPHrine 0.3 mg/0.3 mL IJ SOAJ injection, Inject 0.3 mg into the muscle as needed for anaphylaxis., Disp: , Rfl:  ?  hydrochlorothiazide (HYDRODIURIL) 12.5 MG tablet, Take 1 tablet  (12.5 mg total) by mouth daily., Disp: 90 tablet, Rfl: 2 ?  ibuprofen (ADVIL) 800 MG tablet, Take 1 tablet (800 mg total) by mouth every 8 (eight) hours as needed for moderate pain. For AFTER surgery only, Disp: 30 tablet, Rfl: 0 ?  venlafaxine XR (EFFEXOR-XR) 150 MG 24 hr capsule, TAKE 1 CAPSULE BY MOUTH DAILY WITH BREAKFAST., Disp: 90 capsule, Rfl: 1 ?  zolpidem (AMBIEN) 10 MG tablet, Take 1 tablet (10 mg total) by mouth at bedtime as needed for sleep., Disp: 30 tablet, Rfl: 2 ? ?Review of Systems: ?+ Weight gain, bloating, hot flashes, anxiety. ?Denies appetite changes, fevers, chills, fatigue. ?Denies hearing loss, neck lumps or masses, mouth sores, ringing in ears or voice changes. ?Denies cough or wheezing.  Denies shortness of breath. ?Denies chest pain or palpitations. Denies leg swelling. ?Denies abdominal distention, pain, blood in stools, constipation, diarrhea, nausea, vomiting, or early satiety. ?Denies dysuria, frequency, hematuria or incontinence. ?Denies vaginal bleeding or vaginal discharge.   ?Denies joint pain, back pain or muscle pain/cramps. ?Denies itching, rash, or wounds. ?Denies dizziness, headaches, numbness or seizures. ?Denies swollen lymph nodes or glands, denies easy bruising or bleeding. ?Denies depression, confusion, or decreased concentration. ? ?Physical Exam: ?BP (!) 152/78 (BP Location: Left Arm, Patient Position: Sitting)   Pulse 90   Temp 98 ?F (36.7 ?C) (Oral)   Resp 18   Ht '5\' 3"'$  (1.6 m)   Wt 139 lb 4.8 oz (63.2 kg)   SpO2 100%   BMI 24.68 kg/m?  ?General: alert, oriented, no acute distress. ?HEENT: Normocephalic, atraumatic, sclera anicteric. ?Chest: Unlabored breathing on room air. ?Abdomen: soft, nontender.  Normoactive bowel sounds.  No masses or hepatosplenomegaly appreciated.  Well-healed incisions. ?Extremities: Grossly normal range of motion.  Warm, well perfused.  No edema bilaterally. ?Skin: No rashes or lesions noted. ?Lymphatics: No cervical,  supraclavicular, or inguinal adenopathy. ?GU: Normal appearing external genitalia without erythema, excoriation, or lesions.  Speculum exam reveals no masses or lesions, vaginal mucosa is mildly atrophic, cuff intact.  Bimanual exam reveals mild tenderness along the cuff with palpation, no fluctuance or masses.  Rectovaginal exam confirms these findings, no nodularity. ? ?Laboratory & Radiologic Studies: ?None new ? ?Assessment & Plan: ?Lisa Osborn is a 56 y.o. 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 ? ?On exam, patient is NED.  I am a little concerned about her bloating.  She thinks that this is unrelated to more recent history of constipation.  I have encouraged her to get MiraLAX and use this  daily for the next couple of weeks to see if she can soften her bowel movements.  In the setting of her bloating, I recommend CT scan to rule out granulosa cell recurrence.  I will call her with the results after her scan is done. ? ?She endorses dyspareunia, which she describes as pain internally with some radiation to her mini lap incision.  On exam, she is tender along the vaginal cuff.  I think she might benefit from pelvic floor physical therapy.  She was interested in this intervention.  I will look for pelvic floor physical therapist closer to her home in [place].  If I cannot find somebody closer, she is willing to drive to [place]. ? ?Will plan to get Inhibin B and AMH today. ? ?With regard to her menopausal symptoms, she has been on Effexor without much relief.  We discussed the risks and benefits of estrogen replacement in the setting of her estrogen dependent tumor as well as her hypertension.  At this time, we will plan to discuss this further after CT has been performed. ?  ?We discussed that follow-up will include surveillance visits every 6 months with an exam and lab test.  She will require long-term follow-up given the risk of recurrence at long  intervals from original diagnosis.  We reviewed signs and symptoms that would be concerning for cancer recurrence, and I stressed the importance that she call if she develops any of these between visits. ? ?28 minutes of total tim

## 2021-08-24 NOTE — Patient Instructions (Addendum)
It was good to see you today.  I do not see or feel any evidence of cancer recurrence.  I will release your lab test to you once they are back. ? ?Because of the bloating symptoms you are having, we discussed getting a CT scan today.  I will call you when I have these results back. ? ?Because of your tenderness on exam and pain with intercourse, you and I discussed referral for pelvic floor physical therapy today.  I will see if I can find somebody closer to your area to do this. ? ?You develop any concerning symptoms before your next visit, please call to see me sooner.  Otherwise, please call back in August or September to schedule a 92-monthfollow-up visit with me in October. ?

## 2021-08-31 ENCOUNTER — Other Ambulatory Visit: Payer: Self-pay | Admitting: Gynecologic Oncology

## 2021-08-31 ENCOUNTER — Ambulatory Visit (HOSPITAL_COMMUNITY)
Admission: RE | Admit: 2021-08-31 | Discharge: 2021-08-31 | Disposition: A | Discharge status: Home / Self Care | Payer: BC Managed Care – PPO | Source: Ambulatory Visit | Attending: Gynecologic Oncology | Admitting: Gynecologic Oncology

## 2021-08-31 DIAGNOSIS — N941 Unspecified dyspareunia: Secondary | ICD-10-CM

## 2021-08-31 DIAGNOSIS — D3912 Neoplasm of uncertain behavior of left ovary: Secondary | ICD-10-CM | POA: Insufficient documentation

## 2021-08-31 DIAGNOSIS — R102 Pelvic and perineal pain: Secondary | ICD-10-CM

## 2021-08-31 MED ORDER — SODIUM CHLORIDE (PF) 0.9 % IJ SOLN
INTRAMUSCULAR | Status: AC
  Filled 2021-08-31: qty 50

## 2021-08-31 MED ORDER — IOHEXOL 300 MG/ML  SOLN
100.0000 mL | Freq: Once | INTRAMUSCULAR | Status: AC | PRN
  Administered 2021-08-31: 100 mL via INTRAVENOUS

## 2021-09-02 ENCOUNTER — Other Ambulatory Visit: Payer: Self-pay | Admitting: Nurse Practitioner

## 2021-09-02 DIAGNOSIS — I10 Essential (primary) hypertension: Secondary | ICD-10-CM

## 2021-09-21 ENCOUNTER — Encounter: Payer: Self-pay | Admitting: Nurse Practitioner

## 2021-09-21 ENCOUNTER — Ambulatory Visit: Payer: BC Managed Care – PPO | Admitting: Nurse Practitioner

## 2021-09-21 VITALS — BP 130/71 | HR 79 | Temp 97.5°F | Resp 16 | Ht 63.0 in | Wt 141.4 lb

## 2021-09-21 DIAGNOSIS — J0110 Acute frontal sinusitis, unspecified: Secondary | ICD-10-CM

## 2021-09-21 DIAGNOSIS — F5101 Primary insomnia: Secondary | ICD-10-CM

## 2021-09-21 DIAGNOSIS — I10 Essential (primary) hypertension: Secondary | ICD-10-CM | POA: Diagnosis not present

## 2021-09-21 DIAGNOSIS — B3731 Acute candidiasis of vulva and vagina: Secondary | ICD-10-CM | POA: Diagnosis not present

## 2021-09-21 MED ORDER — ZOLPIDEM TARTRATE 10 MG PO TABS: 10.0000 mg | ORAL_TABLET | Freq: Every evening | ORAL | 2 refills | Status: DC | PRN

## 2021-09-21 MED ORDER — FLUCONAZOLE 150 MG PO TABS: 150.0000 mg | ORAL_TABLET | Freq: Once | ORAL | 0 refills | Status: AC

## 2021-09-21 MED ORDER — AMOXICILLIN-POT CLAVULANATE 875-125 MG PO TABS: 1.0000 | ORAL_TABLET | Freq: Two times a day (BID) | ORAL | 0 refills | Status: AC

## 2021-09-21 MED ORDER — HYDROCHLOROTHIAZIDE 12.5 MG PO TABS: 12.5000 mg | ORAL_TABLET | Freq: Every day | ORAL | 3 refills | Status: DC

## 2021-09-21 NOTE — Progress Notes (Signed)
[facility] ?[address] ?[place], [place] [address] ? ?Internal MEDICINE  ?Office Visit Note ? ?Patient Name: Lisa Osborn ? [id number]  ?[id number] ? ?Date of Service: 09/21/2021 ? ?Chief Complaint  ?Patient presents with  ? Follow-up  ? Medication Refill  ? Sinusitis  ?  Drainage   ? Sore Throat  ? Headache  ?  Behind eyes, and under eyes  ? Anxiety  ? Depression  ? Hyperlipidemia  ? Hypertension  ? ? ?HPI ?Lisa Osborn presents for follow-up visit for Ambien refills which she takes for insomnia.  She is also having symptoms sinusitis.  She reports having sore throat, headache, sinus pressure and pain, nasal congestion, runny nose, postnasal drip, slight cough, headaches and sore neck and swollen lymph nodes. ?She also needs refills for hydrochlorothiazide.  Her blood pressure is stable within normal limits other vital signs are also within normal limits. ? ? ? ?Current Medication: ?Outpatient Encounter Medications as of 09/21/2021  ?Medication Sig Note  ? amLODipine (NORVASC) 10 MG tablet TAKE 1 TABLET BY MOUTH EVERY DAY   ? amoxicillin-clavulanate (AUGMENTIN) 875-125 MG tablet Take 1 tablet by mouth 2 (two) times daily for 10 days.   ? EPINEPHrine 0.3 mg/0.3 mL IJ SOAJ injection Inject 0.3 mg into the muscle as needed for anaphylaxis. 03/23/2021: Prn ?  ? fluconazole (DIFLUCAN) 150 MG tablet Take 1 tablet (150 mg total) by mouth once for 1 dose. May take an additional dose after 3 days if still symptomatic.   ? ibuprofen (ADVIL) 800 MG tablet Take 1 tablet (800 mg total) by mouth every 8 (eight) hours as needed for moderate pain. For AFTER surgery only 03/23/2021: Prn   ? venlafaxine XR (EFFEXOR-XR) 150 MG 24 hr capsule TAKE 1 CAPSULE BY MOUTH DAILY WITH BREAKFAST.   ? [DISCONTINUED] hydrochlorothiazide (HYDRODIURIL) 12.5 MG tablet Take 1 tablet (12.5 mg total) by mouth daily.   ? [DISCONTINUED] zolpidem (AMBIEN) 10 MG tablet Take 1 tablet (10 mg total) by mouth at bedtime as needed for sleep.   ?  hydrochlorothiazide (HYDRODIURIL) 12.5 MG tablet Take 1 tablet (12.5 mg total) by mouth daily.   ? zolpidem (AMBIEN) 10 MG tablet Take 1 tablet (10 mg total) by mouth at bedtime as needed for sleep.   ? ?No facility-administered encounter medications on file as of 09/21/2021.  ? ? ?Surgical History: ?Past Surgical History:  ?Procedure Laterality Date  ? CESAREAN SECTION    ? CHOLECYSTECTOMY    ? COLONOSCOPY WITH PROPOFOL N/A 02/16/2021  ? Procedure: COLONOSCOPY WITH PROPOFOL;  Surgeon: Lin Landsman, MD;  Location: [facility] ENDOSCOPY;  Service: Gastroenterology;  Laterality: N/A;  ? NOSE SURGERY    ? Surgery for deviated septum  ? OTHER SURGICAL HISTORY    ? Mesh placement for bladder prolapse versus urinary incontinence  ? ROBOTIC ASSISTED BILATERAL SALPINGO OOPHERECTOMY N/A 03/03/2021  ? Procedure: XI ROBOTIC ASSISTED BILATERAL SALPINGO OOPHORECTOMY,MINI LAPAROTOMY AND OMENTECTOMY, VAGINAL MASS EXCISION;  Surgeon: Lafonda Mosses, MD;  Location: [facility] ORS;  Service: Gynecology;  Laterality: N/A;  ? VAGINAL HYSTERECTOMY    ? ? ?Medical History: ?Past Medical History:  ?Diagnosis Date  ? Allergy   ? Anxiety   ? Depression   ? Gout 2020  ? none since then  ? 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 HLD (hyperlipidemia)   ? Hypertension   ? ? ?Family History: ?Family History  ?Problem Relation Age of Onset  ? Hyperlipidemia Mother   ? Hyperlipidemia Father   ?  Hypertension Father   ? Renal cancer Father 72  ? Prostate cancer Father 36  ? Melanoma Father   ?     dx. in 64s and a second time in his 38s  ? Brain cancer Brother 54  ? Breast cancer Paternal Aunt   ?     dx. 15s  ? Non-Hodgkin's lymphoma Paternal Aunt   ?     dx. 68s  ? Thyroid cancer Paternal Aunt   ?     dx. 91s  ? Melanoma Paternal Aunt   ?     dx. 2s  ? Pancreatic cancer Paternal Uncle 23  ? Gastric cancer Paternal Uncle 73  ? Prostate cancer Paternal Uncle 22  ? Breast cancer Maternal Grandmother   ? Lung cancer Paternal Grandmother   ?      smoked, dx. 70s  ? Lung cancer Paternal Grandfather   ?     smoked, dx. 23s  ? Breast cancer Cousin   ?     pat cousin, dx. 59s  ? ? ?Social History  ? ?Socioeconomic History  ? Marital status: Married  ?  Spouse name: Not on file  ? Number of children: Not on file  ? Years of education: Not on file  ? Highest education level: Not on file  ?Occupational History  ? Not on file  ?Tobacco Use  ? Smoking status: Never  ?  Passive exposure: Past  ? Smokeless tobacco: Never  ?Vaping Use  ? Vaping Use: Never used  ?Substance and Sexual Activity  ? Alcohol use: Yes  ?  Comment: ocassionally  ? Drug use: Never  ? Sexual activity: Not on file  ?Other Topics Concern  ? Not on file  ?Social History Narrative  ? Not on file  ? ?Social Determinants of Health  ? ?Financial Resource Strain: Not on file  ?Food Insecurity: Not on file  ?Transportation Needs: Not on file  ?Physical Activity: Not on file  ?Stress: Not on file  ?Social Connections: Not on file  ?Intimate Partner Violence: Not on file  ? ? ? ? ?Review of Systems  ?Constitutional:  Negative for chills, fatigue and fever.  ?HENT:  Positive for congestion, postnasal drip, rhinorrhea, sinus pressure, sinus pain, sneezing and sore throat. Negative for ear pain.   ?Respiratory:  Positive for cough. Negative for choking, shortness of breath and wheezing.   ?Cardiovascular: Negative.  Negative for chest pain and palpitations.  ?Gastrointestinal: Negative.  Negative for abdominal pain, constipation, diarrhea, nausea and vomiting.  ?Musculoskeletal:  Negative for myalgias.  ?Skin:  Negative for rash.  ?Neurological:  Positive for headaches.  ? ?Vital Signs: ?BP 130/71   Pulse 79   Temp (!) 97.5 ?F (36.4 ?C)   Resp 16   Ht '5\' 3"'$  (1.6 m)   Wt 141 lb 6.4 oz (64.1 kg)   SpO2 99%   BMI 25.05 kg/m?  ? ? ?Physical Exam ?Vitals reviewed.  ?Constitutional:   ?   Appearance: She is well-developed. She is ill-appearing.  ?HENT:  ?   Head: Normocephalic and atraumatic.  ?   Right Ear:  Tympanic membrane and ear canal normal.  ?   Left Ear: Tympanic membrane and ear canal normal.  ?   Nose: Mucosal edema, congestion and rhinorrhea present. Rhinorrhea is clear.  ?   Right Turbinates: Swollen (with erythema).  ?   Left Turbinates: Swollen (with erythema).  ?   Right Sinus: Maxillary sinus tenderness and frontal sinus tenderness present.  ?  Left Sinus: Maxillary sinus tenderness and frontal sinus tenderness present.  ?   Mouth/Throat:  ?   Lips: Pink.  ?   Mouth: Mucous membranes are moist.  ?   Pharynx: Uvula midline. Posterior oropharyngeal erythema present.  ?Eyes:  ?   Conjunctiva/sclera: Conjunctivae normal.  ?   Pupils: Pupils are equal, round, and reactive to light.  ?Cardiovascular:  ?   Rate and Rhythm: Normal rate and regular rhythm.  ?   Heart sounds: Normal heart sounds. No murmur heard. ?Pulmonary:  ?   Effort: Pulmonary effort is normal. No respiratory distress.  ?   Breath sounds: Normal breath sounds. No wheezing.  ?Skin: ?   General: Skin is warm and dry.  ?   Capillary Refill: Capillary refill takes less than 2 seconds.  ?Neurological:  ?   Mental Status: She is alert and oriented to person, place, and time.  ?Psychiatric:     ?   Mood and Affect: Mood normal.     ?   Behavior: Behavior normal.  ? ? ? ? ? ?Assessment/Plan: ?1. Acute non-recurrent frontal sinusitis ?Empiric antibiotic treatment prescribed. ?- amoxicillin-clavulanate (AUGMENTIN) 875-125 MG tablet; Take 1 tablet by mouth 2 (two) times daily for 10 days.  Dispense: 20 tablet; Refill: 0 ? ?2. Essential hypertension ?Stable, refills ordered.  ?- hydrochlorothiazide (HYDRODIURIL) 12.5 MG tablet; Take 1 tablet (12.5 mg total) by mouth daily.  Dispense: 90 tablet; Refill: 3 ? ?3. Vulvovaginal candidiasis ?Prophylactic treatment prescribed, patient gets yeast infections when she takes antibiotics sometimes ?- fluconazole (DIFLUCAN) 150 MG tablet; Take 1 tablet (150 mg total) by mouth once for 1 dose. May take an additional  dose after 3 days if still symptomatic.  Dispense: 3 tablet; Refill: 0 ? ?4. Primary insomnia ?Stable, refills ordered, follow up in 3 months for additional refills.  ?- zolpidem (AMBIEN) 10 MG tablet; Take 1 tablet (10 mg

## 2021-09-22 ENCOUNTER — Ambulatory Visit: Payer: BC Managed Care – PPO | Admitting: Physical Therapy

## 2021-09-29 ENCOUNTER — Encounter: Payer: Self-pay | Admitting: Physical Therapy

## 2021-09-29 ENCOUNTER — Ambulatory Visit: Payer: BC Managed Care – PPO | Attending: Gynecologic Oncology | Admitting: Physical Therapy

## 2021-09-29 DIAGNOSIS — R102 Pelvic and perineal pain: Secondary | ICD-10-CM | POA: Diagnosis present

## 2021-09-29 DIAGNOSIS — R278 Other lack of coordination: Secondary | ICD-10-CM | POA: Insufficient documentation

## 2021-09-29 DIAGNOSIS — N941 Unspecified dyspareunia: Secondary | ICD-10-CM | POA: Diagnosis not present

## 2021-09-29 DIAGNOSIS — M62838 Other muscle spasm: Secondary | ICD-10-CM | POA: Diagnosis present
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
## 2021-10-06 ENCOUNTER — Encounter: Payer: Self-pay | Admitting: Physical Therapy

## 2021-10-06 ENCOUNTER — Ambulatory Visit: Payer: BC Managed Care – PPO | Admitting: Physical Therapy

## 2021-10-06 DIAGNOSIS — R102 Pelvic and perineal pain unspecified side: Secondary | ICD-10-CM

## 2021-10-06 DIAGNOSIS — R278 Other lack of coordination: Secondary | ICD-10-CM

## 2021-10-06 DIAGNOSIS — M62838 Other muscle spasm: Secondary | ICD-10-CM

## 2021-10-06 NOTE — Patient Instructions (Signed)
Access Code: [email or web address] ?URL: [email or web address] ?Date: 10/06/2021 ?Prepared by: Myles Gip ? ?Exercises ?- Supine Diaphragmatic Breathing  - 2 x daily - 30 reps ?- Supine Lower Trunk Rotation  - 3 sets - 10 reps ?- Modified Thomas Stretch  - 3 sets - 5 breaths hold ?- Doorway Pec Stretch at 90 Degrees Abduction  - 3-5 breaths hold ?- Doorway Hip Flexor Stretch with Chair  - 3-5 breath hold ?

## 2021-10-06 NOTE — Therapy (Signed)
?OUTPATIENT PHYSICAL THERAPY TREATMENT NOTE ? ? ?Patient Name: Lisa Osborn ?MRN: [id number] ?DOB:09-15-1965, 56 y.o., female ?Today's Date: 10/06/2021 ? ?PCP: Jonetta Osgood, NP ?REFERRING PROVIDER: Lafonda Mosses, MD ? ?END OF SESSION:  ? PT End of Session - 10/06/21 0946   ? ? Visit Number 2   ? Number of Visits 12   ? Date for PT Re-Evaluation 12/22/21   ? Authorization Type IE 09/29/2021   ? PT Start Time 2542   ? PT Stop Time 1025   ? PT Time Calculation (min) 40 min   ? Activity Tolerance Patient tolerated treatment well   ? Behavior During Therapy [facility] for tasks assessed/performed   ? ?  ?  ? ?  ? ? ?Past Medical History:  ?Diagnosis Date  ? Allergy   ? Anxiety   ? Depression   ? Gout 2020  ? none since then  ? 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 HLD (hyperlipidemia)   ? Hypertension   ? ?Past Surgical History:  ?Procedure Laterality Date  ? CESAREAN SECTION    ? CHOLECYSTECTOMY    ? COLONOSCOPY WITH PROPOFOL N/A 02/16/2021  ? Procedure: COLONOSCOPY WITH PROPOFOL;  Surgeon: Lin Landsman, MD;  Location: [facility]COPY;  Service: Gastroenterology;  Laterality: N/A;  ? NOSE SURGERY    ? Surgery for deviated septum  ? OTHER SURGICAL HISTORY    ? Mesh placement for bladder prolapse versus urinary incontinence  ? ROBOTIC ASSISTED BILATERAL SALPINGO OOPHERECTOMY N/A 03/03/2021  ? Procedure: XI ROBOTIC ASSISTED BILATERAL SALPINGO OOPHORECTOMY,MINI LAPAROTOMY AND OMENTECTOMY, VAGINAL MASS EXCISION;  Surgeon: Lafonda Mosses, MD;  Location: [facility];  Service: Gynecology;  Laterality: N/A;  ? VAGINAL HYSTERECTOMY    ? ?Patient Active Problem List  ? Diagnosis Date Noted  ? Genetic testing 03/16/2021  ? 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 Vaginal lesion   ? Family history of breast cancer 02/24/2021  ? Family history of pancreatic cancer 02/24/2021  ? Family history of prostate cancer 02/24/2021  ? Colon cancer screening   ? Estrogen excess 02/13/2021  ? Pelvic mass 02/13/2021  ?  Intractable migraine without status migrainosus 12/31/2018  ? Dysuria 12/31/2018  ? Body aches 08/12/2018  ? Panic disorder 01/06/2018  ? Encounter for general adult medical examination with abnormal findings 01/06/2018  ? Sore throat 10/18/2017  ? Flu-like symptoms 10/18/2017  ? Acute upper respiratory infection 10/18/2017  ? Coughing 10/18/2017  ? Essential hypertension 10/18/2017  ? Primary insomnia 10/18/2017  ? ? ?REFERRING DIAG: R10.2 (ICD-10-CM) - Vaginal pain N94.10 (ICD-10-CM) - Dyspareunia in female  ? ?THERAPY DIAG:  ?Pelvic pain ? ?Other lack of coordination ? ?Other muscle spasm ? ?PERTINENT HISTORY: She endorses dyspareunia, which she describes as pain internally with some radiation to her mini lap incision. On exam, she is tender along the vaginal cuff - MD note ? ?PRECAUTIONS: none ? ?SUBJECTIVE: Patient denies any change from initial evaluation. No new concerns. Patient did note some abdominal pain superior to midline scar site with sexual activity.  ? ?PAIN:  ?Are you having pain? No ? ? ?OBJECTIVE: (objective measures completed at initial evaluation unless otherwise dated) ? ?TREATMENT ? ?Pre-treatment assessment: ?RANGE OF MOTION:  ?  LEFT RIGHT  ?Lumbar forward flexion (65):  WNL    ?Lumbar extension (30): WNL (discomfort at RLQ)    ?Lumbar lateral flexion (25):  WNL WNL  ?Thoracic and Lumbar rotation (30 degrees):    WNL (discomfort at  RLQ) WNL  ?Hip Flexion (0-125):   WNL WNL  ?Hip IR (0-45):  WNL WNL  ?Hip ER (0-45):  WNL WNL  ?Hip Abduction (0-40):  WNL WNL  ?Hip extension (0-15):  WNL WNL  ? ?SENSATION: ?Grossly intact to light touch bilateral LEs as determined by testing dermatomes L2-S2 ?Proprioception and hot/cold testing deferred on this date ? ?STRENGTH: MMT  ? RLE LLE  ?Hip Flexion 5 5  ?Hip Abduction  5 4  ?Hip Adduction  5 3*  ?Hip ER  5 3  ?Hip IR  5 3  ?Knee Extension 5 3+  ?Knee Flexion 5 3+  ? ?ABDOMINAL:  ?Palpation: TTP of RUQ, Carnett's test, positive for abdominal wall  tenderness ?Diastasis: <1 finger superior and inferior, 2 finger at umbilicus, tender ?Scar mobility: no restrictions noted ?Rib flare: B with resting hyperextension of thoracolumbar junction ? ?SPECIAL TESTS: ?SLR (SN 92, -LR 0.29): R: Negative L:  Negative ?FABER (SN 81): R: Negative L: Negative ?FADIR (SN 94): R: Negative L: Negative ? ?Manual Therapy: ? ? ?Neuromuscular Re-education: ?Pain/postural interventions as listed:  ?- Supine Diaphragmatic Breathing  - 2 x daily - 30 reps ?- Supine Lower Trunk Rotation  - 3 sets - 10 reps ?- Modified Thomas Stretch  - 3 sets - 5 breaths hold ?- Doorway Pec Stretch at 90 Degrees Abduction  - 3-5 breaths hold ?- Doorway Hip Flexor Stretch with Chair  - 3-5 breath hold ? ?Therapeutic Exercise: ? ? ?Treatments unbilled: ? ?Post-treatment assessment: ? ?Patient educated throughout session on appropriate technique and form using multi-modal cueing, HEP, and activity modification. Patient articulated understanding and returned demonstration. ? ?Patient Response to interventions: ? ? ?ASSESSMENT ? ?PATIENT SURVEYS:  ?FOTO PFDI Pain 8, PFDI Prolapse 8 ? ?ASSESSMENT: ? ?Clinical impression: ?Patient presents to clinic with excellent motivation to participate in therapy. Patient demonstrates deficits in deep core strength, postural endurance, PFM strength, PFM coordination, IAP management, and pain. Patient able to achieve fascial sling stretches with coordinated diaphragmatic breath during today's session and responded positively to active interventions. Patient will benefit from continued skilled therapeutic intervention to address remaining deficits in deep core strength, postural endurance, PFM strength, PFM coordination, IAP management, and pain in order to increase function and improve overall QOL. ? ? ?Objective impairments: decreased activity tolerance, decreased coordination, decreased endurance, decreased strength, improper body mechanics, postural dysfunction, and pain.   ? ?Activity limitations: cleaning, community activity, occupation, and yard work.  ? ?Personal factors: Age, Behavior pattern, Past/current experiences, Time since onset of injury/illness/exacerbation, and 3+ comorbidities: anxiety, deprssion, HLD, HTN, granulosa cell tumor of left ovary are also affecting patient's functional outcome.  ? ?Rehab Potential: Good ? ?Clinical decision making: Evolving/moderate complexity ? ?Evaluation complexity: Moderate ? ? ?GOALS: ?Goals reviewed with patient? Yes ? ?LONG TERM GOALS: Target date: 12/22/2021 ? ?Patient will demonstrate improved function as evidenced by a score of 0 on both PFDI Pain and PFDI Prolapse measure for full participation in activities at home and in the community.  ?Baseline: 8 ?Goal status: INITIAL ? ?2.  Patient will be able to articulate and demonstrate 3-5 postures that encourage gravity assisted repositioning of pelvic organs to decrease discomfort and need for manual insertion of tissue at end of day in order to participate more fully in activities at home and in the community.  ?Baseline: not demonstrated ?Goal status: INITIAL ? ?3.  Patient will demonstrate circumferential and sequential contraction of >4/5 MMT, > 6 sec hold x10 and 5 consecutive quick  flicks with </= 10 min rest between testing bouts, and relaxation of the PFM coordinated with breath for improved management of intra-abdominal pressure and normal bowel and bladder function without the presence of pain nor incontinence in order to improve participation at home and in the community. ?Baseline: not formally assessed  ?Goal status: INITIAL ? ?4.  Patient will decrease worst pain as reported on NPRS by at least 2 points to demonstrate clinically significant reduction in pain in order to restore/improve function and overall QOL. ?Baseline: 5/10 (penetration), 7-8/10 (L lumbopelvic) ?Goal status: INITIAL ? ? ? ?PLAN: ?Rehab frequency: 1x/week ? ?Rehab duration: 12 weeks ? ?Planned  interventions: Therapeutic exercises, Therapeutic activity, Neuromuscular re-education, Balance training, Gait training, Patient/Family education, Joint mobilization, Orthotic/Fit training, Electrical stimulation, S

## 2021-10-13 ENCOUNTER — Ambulatory Visit: Payer: BC Managed Care – PPO | Admitting: Physical Therapy

## 2021-10-13 ENCOUNTER — Encounter: Payer: Self-pay | Admitting: Physical Therapy

## 2021-10-13 DIAGNOSIS — R102 Pelvic and perineal pain: Secondary | ICD-10-CM | POA: Diagnosis not present

## 2021-10-13 DIAGNOSIS — M62838 Other muscle spasm: Secondary | ICD-10-CM

## 2021-10-13 DIAGNOSIS — R278 Other lack of coordination: Secondary | ICD-10-CM

## 2021-10-13 NOTE — Therapy (Signed)
OUTPATIENT PHYSICAL THERAPY TREATMENT NOTE   Patient Name: Lisa Osborn MRN: [id number] DOB:Dec 14, 1965, 56 y.o., female Today's Date: 10/13/2021  PCP: Jonetta Osgood, NP REFERRING PROVIDER: Lafonda Mosses, MD  END OF SESSION:   PT End of Session - 10/13/21 0945     Visit Number 3    Number of Visits 12    Date for PT Re-Evaluation 12/22/21    Authorization Type IE 09/29/2021    PT Start Time 0945    PT Stop Time 1025    PT Time Calculation (min) 40 min    Activity Tolerance Patient tolerated treatment well    Behavior During Therapy [facility] for tasks assessed/performed             Past Medical History:  Diagnosis Date   Allergy    Anxiety    Depression    Gout 2020   none since then   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   HLD (hyperlipidemia)    Hypertension    Past Surgical History:  Procedure Laterality Date   CESAREAN SECTION     CHOLECYSTECTOMY     COLONOSCOPY WITH PROPOFOL N/A 02/16/2021   Procedure: COLONOSCOPY WITH PROPOFOL;  Surgeon: Lin Landsman, MD;  Location: [facility] END[facility];  Service: Gastroenterology;  Laterality: N/A;   NOSE SURGERY     Surgery for deviated septum   OTHER SURGICAL HISTORY     Mesh placement for bladder prolapse versus urinary incontinence   ROBOTIC ASSISTED BILATERAL SALPINGO OOPHERECTOMY N/A 03/03/2021   Procedure: XI ROBOTIC ASSISTED BILATERAL SALPINGO OOPHORECTOMY,MINI LAPAROTOMY AND OMENTECTOMY, VAGINAL MASS EXCISION;  Surgeon: Lafonda Mosses, MD;  Location: [facility];  Service: Gynecology;  Laterality: N/A;   VAGINAL HYSTERECTOMY     Patient Active Problem List   Diagnosis Date Noted   Genetic testing 03/16/2021   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   Vaginal lesion    Family history of breast cancer 02/24/2021   Family history of pancreatic cancer 02/24/2021   Family history of prostate cancer 02/24/2021   Colon cancer screening    Estrogen excess 02/13/2021   Pelvic mass 02/13/2021    Intractable migraine without status migrainosus 12/31/2018   Dysuria 12/31/2018   Body aches 08/12/2018   Panic disorder 01/06/2018   Encounter for general adult medical examination with abnormal findings 01/06/2018   Sore throat 10/18/2017   Flu-like symptoms 10/18/2017   Acute upper respiratory infection 10/18/2017   Coughing 10/18/2017   Essential hypertension 10/18/2017   Primary insomnia 10/18/2017    REFERRING DIAG: R10.2 (ICD-10-CM) - Vaginal pain N94.10 (ICD-10-CM) - Dyspareunia in female   THERAPY DIAG:  Pelvic pain  Other lack of coordination  Other muscle spasm  PERTINENT HISTORY: She endorses dyspareunia, which she describes as pain internally with some radiation to her mini lap incision. On exam, she is tender along the vaginal cuff - MD note  PRECAUTIONS: none  SUBJECTIVE: Patient notes that since last session she has noticed soreness in RUQ abdominals with exercises. Patient notes that she feels the exercises are productive even if they cause a bit of discomfort. Patient finds the breathing difficult to maintain diaphragm engagement without reverting back to chest breathing and has some questions about understanding the mechanics. Patient does not that abdominal bracing with pillow during sex did alleviate pain completely with enough pillows.   PAIN:  Are you having pain? No   OBJECTIVE: (objective measures completed at initial evaluation unless otherwise dated)  TREATMENT  10/06/2021 RANGE  OF MOTION:    LEFT RIGHT  Lumbar forward flexion (65):  WNL    Lumbar extension (30): WNL (discomfort at RLQ)    Lumbar lateral flexion (25):  WNL WNL  Thoracic and Lumbar rotation (30 degrees):    WNL (discomfort at RLQ) WNL  Hip Flexion (0-125):   WNL WNL  Hip IR (0-45):  WNL WNL  Hip ER (0-45):  WNL WNL  Hip Abduction (0-40):  WNL WNL  Hip extension (0-15):  WNL WNL   SENSATION: Grossly intact to light touch bilateral LEs as determined by testing dermatomes  L2-S2 Proprioception and hot/cold testing deferred on this date  STRENGTH: MMT   RLE LLE  Hip Flexion 5 5  Hip Abduction  5 4  Hip Adduction  5 3*  Hip ER  5 3  Hip IR  5 3  Knee Extension 5 3+  Knee Flexion 5 3+   ABDOMINAL:  Palpation: TTP of RUQ, Carnett's test, positive for abdominal wall tenderness Diastasis: <1 finger superior and inferior, 2 finger at umbilicus, tender Scar mobility: no restrictions noted Rib flare: B with resting hyperextension of thoracolumbar junction  SPECIAL TESTS: SLR (SN 92, -LR 0.29): R: Negative L:  Negative FABER (SN 81): R: Negative L: Negative FADIR (SN 94): R: Negative L: Negative  10/13/2021 EXTERNAL PELVIC EXAM: Patient educated on the purpose of the procedure/exam and articulated understanding and consented to the procedure/exam.  Breath coordination: absent 2/2 to difficulty with diaphragmatic breathing Voluntary Contraction: present with neural overflow to gluteals and adductors B Relaxation: delayed Perineal movement with sustained IAP increase ("bear down"): no change Perineal movement with rapid IAP increase ("cough"): no change  Manual Therapy:   Neuromuscular Re-education: Patient educated on nervous system up and downregulation and the impact on muscle tension throughout the body with further education on basic strategies to encourage downregulation including: diaphragmatic breathing, low lighting, decreasing noise/volume, decreasing traveling speed. Patient educated on intra-abdominal pressure system and impacts on pressure from breath patterns, PFM, and core musculature. Patient educated on common times when breath holding occurs: high effort, high thought, high stress, high pain activities. Supine hooklying diaphragmatic breathing with VCs and TCs for downregulation of the nervous system and improved management of IAP Supine hooklying, PFM lengthening with inhalation. VCs and TCs to decrease compensatory patterns and encourage  optimal relaxation of the PFM.    Therapeutic Exercise:   Treatments unbilled:  Post-treatment assessment:  Patient educated throughout session on appropriate technique and form using multi-modal cueing, HEP, and activity modification. Patient articulated understanding and returned demonstration.  Patient Response to interventions:   ASSESSMENT  PATIENT SURVEYS:  FOTO PFDI Pain 8, PFDI Prolapse 8  ASSESSMENT:  Clinical impression: Patient presents to clinic with excellent motivation to participate in therapy. Patient demonstrates deficits in deep core strength, postural endurance, PFM strength, PFM coordination, IAP management, and pain. External examination of PFM confirms deficits in PFM coordination with delayed relaxation and no perineal movement with sustained increase in IAP. Patient responded positively to educational interventions on strategies to downregulate nervous system and diversify breathing mechanics for improved PFM coordination and IAP management. Patient will benefit from continued skilled therapeutic intervention to address remaining deficits in deep core strength, postural endurance, PFM strength, PFM coordination, IAP management, and pain in order to increase function and improve overall QOL.   Objective impairments: decreased activity tolerance, decreased coordination, decreased endurance, decreased strength, improper body mechanics, postural dysfunction, and pain.   Activity limitations: cleaning, community activity, occupation, and yard  work.   Charity fundraiser factors: Age, Behavior pattern, Past/current experiences, Time since onset of injury/illness/exacerbation, and 3+ comorbidities: anxiety, deprssion, HLD, HTN, granulosa cell tumor of left ovary are also affecting patient's functional outcome.   Rehab Potential: Good  Clinical decision making: Evolving/moderate complexity  Evaluation complexity: Moderate   GOALS: Goals reviewed with patient? Yes  LONG  TERM GOALS: Target date: 12/22/2021  Patient will demonstrate improved function as evidenced by a score of 0 on both PFDI Pain and PFDI Prolapse measure for full participation in activities at home and in the community.  Baseline: 8 Goal status: INITIAL  2.  Patient will be able to articulate and demonstrate 3-5 postures that encourage gravity assisted repositioning of pelvic organs to decrease discomfort and need for manual insertion of tissue at end of day in order to participate more fully in activities at home and in the community.  Baseline: not demonstrated Goal status: INITIAL  3.  Patient will demonstrate circumferential and sequential contraction of >4/5 MMT, > 6 sec hold x10 and 5 consecutive quick flicks with </= 10 min rest between testing bouts, and relaxation of the PFM coordinated with breath for improved management of intra-abdominal pressure and normal bowel and bladder function without the presence of pain nor incontinence in order to improve participation at home and in the community. Baseline: not formally assessed  Goal status: INITIAL  4.  Patient will decrease worst pain as reported on NPRS by at least 2 points to demonstrate clinically significant reduction in pain in order to restore/improve function and overall QOL. Baseline: 5/10 (penetration), 7-8/10 (L lumbopelvic) Goal status: INITIAL    PLAN: Rehab frequency: 1x/week  Rehab duration: 12 weeks  Planned interventions: Therapeutic exercises, Therapeutic activity, Neuromuscular re-education, Balance training, Gait training, Patient/Family education, Joint mobilization, Orthotic/Fit training, Electrical stimulation, Spinal manipulation, Spinal mobilization, Cryotherapy, Moist heat, scar mobilization, Taping, and Manual therapy   Myles Gip PT, DPT [phone]  10/13/2021, 9:45 AM

## 2021-10-20 ENCOUNTER — Encounter: Payer: Self-pay | Admitting: Physical Therapy

## 2021-10-20 ENCOUNTER — Ambulatory Visit: Payer: BC Managed Care – PPO | Admitting: Physical Therapy

## 2021-10-20 DIAGNOSIS — R102 Pelvic and perineal pain: Secondary | ICD-10-CM | POA: Diagnosis not present

## 2021-10-20 DIAGNOSIS — R278 Other lack of coordination: Secondary | ICD-10-CM

## 2021-10-20 DIAGNOSIS — M62838 Other muscle spasm: Secondary | ICD-10-CM

## 2021-10-20 NOTE — Therapy (Signed)
OUTPATIENT PHYSICAL THERAPY TREATMENT NOTE   Patient Name: Lisa Osborn MRN: [id number] DOB:[birth year], 56 y.o., female Today's Date: 10/20/2021  PCP: Jonetta Osgood, NP REFERRING PROVIDER: Lafonda Mosses, MD  END OF SESSION:   PT End of Session - 10/20/21 1417     Visit Number 4    Number of Visits 12    Date for PT Re-Evaluation 12/22/21    Authorization Type IE 09/29/2021    PT Start Time 1415    PT Stop Time 1455    PT Time Calculation (min) 40 min    Activity Tolerance Patient tolerated treatment well    Behavior During Therapy [facility] for tasks assessed/performed             Past Medical History:  Diagnosis Date   Allergy    Anxiety    Depression    Gout 2020   none since then   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   HLD (hyperlipidemia)    Hypertension    Past Surgical History:  Procedure Laterality Date   CESAREAN SECTION     CHOLECYSTECTOMY     COLONOSCOPY WITH PROPOFOL N/A 02/16/2021   Procedure: COLONOSCOPY WITH PROPOFOL;  Surgeon: Lin Landsman, MD;  Location: [facility];  Service: Gastroenterology;  Laterality: N/A;   NOSE SURGERY     Surgery for deviated septum   OTHER SURGICAL HISTORY     Mesh placement for bladder prolapse versus urinary incontinence   ROBOTIC ASSISTED BILATERAL SALPINGO OOPHERECTOMY N/A 03/03/2021   Procedure: XI ROBOTIC ASSISTED BILATERAL SALPINGO OOPHORECTOMY,MINI LAPAROTOMY AND OMENTECTOMY, VAGINAL MASS EXCISION;  Surgeon: Lafonda Mosses, MD;  Location: [facility];  Service: Gynecology;  Laterality: N/A;   VAGINAL HYSTERECTOMY     Patient Active Problem List   Diagnosis Date Noted   Genetic testing 03/16/2021   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   Vaginal lesion    Family history of breast cancer 02/24/2021   Family history of pancreatic cancer 02/24/2021   Family history of prostate cancer 02/24/2021   Colon cancer screening    Estrogen excess 02/13/2021   Pelvic mass 02/13/2021    Intractable migraine without status migrainosus 12/31/2018   Dysuria 12/31/2018   Body aches 08/12/2018   Panic disorder 01/06/2018   Encounter for general adult medical examination with abnormal findings 01/06/2018   Sore throat 10/18/2017   Flu-like symptoms 10/18/2017   Acute upper respiratory infection 10/18/2017   Coughing 10/18/2017   Essential hypertension 10/18/2017   Primary insomnia 10/18/2017    REFERRING DIAG: R10.2 (ICD-10-CM) - Vaginal pain N94.10 (ICD-10-CM) - Dyspareunia in female   THERAPY DIAG:  Pelvic pain  Other lack of coordination  Other muscle spasm  PERTINENT HISTORY: She endorses dyspareunia, which she describes as pain internally with some radiation to her mini lap incision. On exam, she is tender along the vaginal cuff - MD note  PRECAUTIONS: none  SUBJECTIVE: Patient notes that she has had a busy weekend with an unexpected death and a children's birthday party. Patient did work on Tree surgeon and downtraining the nervous system.  PAIN:  Are you having pain? No   OBJECTIVE: (objective measures completed at initial evaluation unless otherwise dated)  TREATMENT  10/06/2021 RANGE OF MOTION:    LEFT RIGHT  Lumbar forward flexion (65):  WNL    Lumbar extension (30): WNL (discomfort at RLQ)    Lumbar lateral flexion (25):  WNL WNL  Thoracic and Lumbar rotation (30 degrees):  WNL (discomfort at RLQ) WNL  Hip Flexion (0-125):   WNL WNL  Hip IR (0-45):  WNL WNL  Hip ER (0-45):  WNL WNL  Hip Abduction (0-40):  WNL WNL  Hip extension (0-15):  WNL WNL   SENSATION: Grossly intact to light touch bilateral LEs as determined by testing dermatomes L2-S2 Proprioception and hot/cold testing deferred on this date  STRENGTH: MMT   RLE LLE  Hip Flexion 5 5  Hip Abduction  5 4  Hip Adduction  5 3*  Hip ER  5 3  Hip IR  5 3  Knee Extension 5 3+  Knee Flexion 5 3+   ABDOMINAL:  Palpation: TTP of RUQ, Carnett's test, positive for abdominal  wall tenderness Diastasis: <1 finger superior and inferior, 2 finger at umbilicus, tender Scar mobility: no restrictions noted Rib flare: B with resting hyperextension of thoracolumbar junction  SPECIAL TESTS: SLR (SN 92, -LR 0.29): R: Negative L:  Negative FABER (SN 81): R: Negative L: Negative FADIR (SN 94): R: Negative L: Negative  10/13/2021 EXTERNAL PELVIC EXAM: Patient educated on the purpose of the procedure/exam and articulated understanding and consented to the procedure/exam.  Breath coordination: absent 2/2 to difficulty with diaphragmatic breathing Voluntary Contraction: present with neural overflow to gluteals and adductors B Relaxation: delayed Perineal movement with sustained IAP increase ("bear down"): no change Perineal movement with rapid IAP increase ("cough"): no change  10/20/2021 Manual Therapy:   Neuromuscular Re-education: Cat cow with coordinated breathing for improved spinal mobility and posture Supine thoracic extension with towel roll with SUE OH reach, B, sustained for 5 breaths Supine thoracic extension with towel roll and BUE OH reach, sustained for 2 min Supine thoracic mobilization over towel roll with pelvic tilt and TrA activation for improved rib flare   Therapeutic Exercise:   Treatments unbilled:  Post-treatment assessment:  Patient educated throughout session on appropriate technique and form using multi-modal cueing, HEP, and activity modification. Patient articulated understanding and returned demonstration.  Patient Response to interventions: Comfortable to trial at home; return in 2 weeks  ASSESSMENT  PATIENT SURVEYS:  FOTO PFDI Pain 8, PFDI Prolapse 8  ASSESSMENT:  Clinical impression: Patient presents to clinic with excellent motivation to participate in therapy. Patient demonstrates deficits in deep core strength, postural endurance, PFM strength, PFM coordination, IAP management, and pain. Patient with improved rib posture  with thoracic mobilization in supine combined with TrA activation and responded positively to education on postural considerations and interventions. Patient will benefit from continued skilled therapeutic intervention to address remaining deficits in deep core strength, postural endurance, PFM strength, PFM coordination, IAP management, and pain in order to increase function and improve overall QOL.   Objective impairments: decreased activity tolerance, decreased coordination, decreased endurance, decreased strength, improper body mechanics, postural dysfunction, and pain.   Activity limitations: cleaning, community activity, occupation, and yard work.   Personal factors: Age, Behavior pattern, Past/current experiences, Time since onset of injury/illness/exacerbation, and 3+ comorbidities: anxiety, deprssion, HLD, HTN, granulosa cell tumor of left ovary are also affecting patient's functional outcome.   Rehab Potential: Good  Clinical decision making: Evolving/moderate complexity  Evaluation complexity: Moderate   GOALS: Goals reviewed with patient? Yes  LONG TERM GOALS: Target date: 12/22/2021  Patient will demonstrate improved function as evidenced by a score of 0 on both PFDI Pain and PFDI Prolapse measure for full participation in activities at home and in the community.  Baseline: 8 Goal status: INITIAL  2.  Patient will be able to  articulate and demonstrate 3-5 postures that encourage gravity assisted repositioning of pelvic organs to decrease discomfort and need for manual insertion of tissue at end of day in order to participate more fully in activities at home and in the community.  Baseline: not demonstrated Goal status: INITIAL  3.  Patient will demonstrate circumferential and sequential contraction of >4/5 MMT, > 6 sec hold x10 and 5 consecutive quick flicks with </= 10 min rest between testing bouts, and relaxation of the PFM coordinated with breath for improved management of  intra-abdominal pressure and normal bowel and bladder function without the presence of pain nor incontinence in order to improve participation at home and in the community. Baseline: not formally assessed  Goal status: INITIAL  4.  Patient will decrease worst pain as reported on NPRS by at least 2 points to demonstrate clinically significant reduction in pain in order to restore/improve function and overall QOL. Baseline: 5/10 (penetration), 7-8/10 (L lumbopelvic) Goal status: INITIAL    PLAN: Rehab frequency: 1x/week  Rehab duration: 12 weeks  Planned interventions: Therapeutic exercises, Therapeutic activity, Neuromuscular re-education, Balance training, Gait training, Patient/Family education, Joint mobilization, Orthotic/Fit training, Electrical stimulation, Spinal manipulation, Spinal mobilization, Cryotherapy, Moist heat, scar mobilization, Taping, and Manual therapy   Myles Gip PT, DPT [phone]  10/20/2021, 2:17 PM

## 2021-11-03 ENCOUNTER — Encounter: Payer: Self-pay | Admitting: Physical Therapy

## 2021-11-10 ENCOUNTER — Encounter: Payer: Self-pay | Admitting: Physical Therapy

## 2021-11-10 ENCOUNTER — Ambulatory Visit: Payer: BC Managed Care – PPO | Attending: Gynecologic Oncology | Admitting: Physical Therapy

## 2021-11-10 DIAGNOSIS — R102 Pelvic and perineal pain: Secondary | ICD-10-CM | POA: Diagnosis present

## 2021-11-10 DIAGNOSIS — M62838 Other muscle spasm: Secondary | ICD-10-CM | POA: Insufficient documentation

## 2021-11-10 DIAGNOSIS — R278 Other lack of coordination: Secondary | ICD-10-CM | POA: Insufficient documentation

## 2021-11-10 NOTE — Therapy (Signed)
OUTPATIENT PHYSICAL THERAPY TREATMENT NOTE   Patient Name: Lisa Osborn MRN: [id number] DOB:03-20-1966, 56 y.o., female Today's Date: 11/10/2021  PCP: Jonetta Osgood, NP REFERRING PROVIDER: Lafonda Mosses, MD  END OF SESSION:   PT End of Session - 11/10/21 1354     Visit Number 5    Number of Visits 12    Date for PT Re-Evaluation 12/22/21    Authorization Type IE 09/29/2021    PT Start Time 1350    PT Stop Time 1430    PT Time Calculation (min) 40 min    Activity Tolerance Patient tolerated treatment well    Behavior During Therapy [facility] for tasks assessed/performed             Past Medical History:  Diagnosis Date   Allergy    Anxiety    Depression    Gout 2020   none since then   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   HLD (hyperlipidemia)    Hypertension    Past Surgical History:  Procedure Laterality Date   CESAREAN SECTION     CHOLECYSTECTOMY     COLONOSCOPY WITH PROPOFOL N/A 02/16/2021   Procedure: COLONOSCOPY WITH PROPOFOL;  Surgeon: Lin Landsman, MD;  Location: [facility] ENDOSCOPY;  Service: Gastroenterology;  Laterality: N/A;   NOSE SURGERY     Surgery for deviated septum   OTHER SURGICAL HISTORY     Mesh placement for bladder prolapse versus urinary incontinence   ROBOTIC ASSISTED BILATERAL SALPINGO OOPHERECTOMY N/A 03/03/2021   Procedure: XI ROBOTIC ASSISTED BILATERAL SALPINGO OOPHORECTOMY,MINI LAPAROTOMY AND OMENTECTOMY, VAGINAL MASS EXCISION;  Surgeon: Lafonda Mosses, MD;  Location: [facility];  Service: Gynecology;  Laterality: N/A;   VAGINAL HYSTERECTOMY     Patient Active Problem List   Diagnosis Date Noted   Genetic testing 03/16/2021   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   Vaginal lesion    Family history of breast cancer 02/24/2021   Family history of pancreatic cancer 02/24/2021   Family history of prostate cancer 02/24/2021   Colon cancer screening    Estrogen excess 02/13/2021   Pelvic mass 02/13/2021    Intractable migraine without status migrainosus 12/31/2018   Dysuria 12/31/2018   Body aches 08/12/2018   Panic disorder 01/06/2018   Encounter for general adult medical examination with abnormal findings 01/06/2018   Sore throat 10/18/2017   Flu-like symptoms 10/18/2017   Acute upper respiratory infection 10/18/2017   Coughing 10/18/2017   Essential hypertension 10/18/2017   Primary insomnia 10/18/2017    REFERRING DIAG: R10.2 (ICD-10-CM) - Vaginal pain N94.10 (ICD-10-CM) - Dyspareunia in female   THERAPY DIAG:  Pelvic pain  Other lack of coordination  Other muscle spasm  PERTINENT HISTORY: She endorses dyspareunia, which she describes as pain internally with some radiation to her mini lap incision. On exam, she is tender along the vaginal cuff - MD note  PRECAUTIONS: none  SUBJECTIVE: Patient reports that she has had increased personal stressors which has contributed to upper back and neck pain/tension. Patient does continue to report improved tolerance to participation in sexual activities. Patient has not had any UI but has not returned to running yet. "Still dribbles but not as bad."  PAIN:  Are you having pain? No   OBJECTIVE: (objective measures completed at initial evaluation unless otherwise dated)  TREATMENT  10/06/2021 RANGE OF MOTION:    LEFT RIGHT  Lumbar forward flexion (65):  WNL    Lumbar extension (30): WNL (discomfort at RLQ)  Lumbar lateral flexion (25):  WNL WNL  Thoracic and Lumbar rotation (30 degrees):    WNL (discomfort at RLQ) WNL  Hip Flexion (0-125):   WNL WNL  Hip IR (0-45):  WNL WNL  Hip ER (0-45):  WNL WNL  Hip Abduction (0-40):  WNL WNL  Hip extension (0-15):  WNL WNL   SENSATION: Grossly intact to light touch bilateral LEs as determined by testing dermatomes L2-S2 Proprioception and hot/cold testing deferred on this date  STRENGTH: MMT   RLE LLE  Hip Flexion 5 5  Hip Abduction  5 4  Hip Adduction  5 3*  Hip ER  5 3  Hip IR  5  3  Knee Extension 5 3+  Knee Flexion 5 3+   ABDOMINAL:  Palpation: TTP of RUQ, Carnett's test, positive for abdominal wall tenderness Diastasis: <1 finger superior and inferior, 2 finger at umbilicus, tender Scar mobility: no restrictions noted Rib flare: B with resting hyperextension of thoracolumbar junction  SPECIAL TESTS: SLR (SN 92, -LR 0.29): R: Negative L:  Negative FABER (SN 81): R: Negative L: Negative FADIR (SN 94): R: Negative L: Negative  11/10/2021 EXTERNAL PELVIC EXAM: Patient educated on the purpose of the procedure/exam and articulated understanding and consented to the procedure/exam.  Breath coordination: present with increased reps Voluntary Contraction: present, able to control compensations for 1-2 reps Relaxation: full Perineal movement with rapid IAP increase ("cough"): gentle lift with initial cough, gentle descent with subsequent coughs  11/10/2021 Manual Therapy:   Neuromuscular Re-education: Reassessed goals; see below.  Supine bridge with coordinated breath for improved gluteal strength, core stability, and pelvic organ posture Quadruped rocking for improved hip mobility and pelvic organ posture Quadruped hip circles for improved hip mobility and pelvic organ posture   Therapeutic Exercise:   Treatments unbilled:  Post-treatment assessment:  Patient educated throughout session on appropriate technique and form using multi-modal cueing, HEP, and activity modification. Patient articulated understanding and returned demonstration.  Patient Response to interventions: Comfortable to return in 2 weeks  ASSESSMENT  PATIENT SURVEYS:  FOTO PFDI Pain 13, PFDI Prolapse 17  ASSESSMENT:  Clinical impression: Patient presents to clinic with excellent motivation to participate in therapy. Patient demonstrates deficits in deep core strength, postural endurance, PFM strength, PFM coordination, IAP management, and pain. Patient had made progress toward  pain, coordination, and body mechanics goals as indicated with reassessment today in clinic. Patient had appropriate coordination of diaphragm and PFM when tested, but does continue to benefit from moderate cueing for consistent quality of PFM length. Patient will benefit from continued skilled therapeutic intervention to address remaining deficits in deep core strength, postural endurance, PFM strength, PFM coordination, IAP management, and pain in order to increase function and improve overall QOL.   Objective impairments: decreased activity tolerance, decreased coordination, decreased endurance, decreased strength, improper body mechanics, postural dysfunction, and pain.   Activity limitations: cleaning, community activity, occupation, and yard work.   Personal factors: Age, Behavior pattern, Past/current experiences, Time since onset of injury/illness/exacerbation, and 3+ comorbidities: anxiety, deprssion, HLD, HTN, granulosa cell tumor of left ovary are also affecting patient's functional outcome.   Rehab Potential: Good  Clinical decision making: Evolving/moderate complexity  Evaluation complexity: Moderate   GOALS: Goals reviewed with patient? Yes  LONG TERM GOALS: Target date: 12/22/2021  Patient will demonstrate improved function as evidenced by a score of 0 on both PFDI Pain and PFDI Prolapse measure for full participation in activities at home and in the community.  Baseline: 8;  6/20: 17 POP, 13 Pain Goal status: IN PROGRESS  2.  Patient will be able to articulate and demonstrate 3-5 postures that encourage gravity assisted repositioning of pelvic organs to decrease discomfort and need for manual insertion of tissue at end of day in order to participate more fully in activities at home and in the community.  Baseline: not demonstrated; 6/20: bridge, cat cow Goal status: IN PROGRESS  3.  Patient will demonstrate circumferential and sequential contraction of >4/5 MMT, > 6 sec hold  x10 and 5 consecutive quick flicks with </= 10 min rest between testing bouts, and relaxation of the PFM coordinated with breath for improved management of intra-abdominal pressure and normal bowel and bladder function without the presence of pain nor incontinence in order to improve participation at home and in the community. Baseline: not formally assessed ; 6/20: 2/5 MMT x2 with max cueing  Goal status: IN PROGRESS  4.  Patient will decrease worst pain as reported on NPRS by at least 2 points to demonstrate clinically significant reduction in pain in order to restore/improve function and overall QOL. Baseline: 5/10 (penetration), 7-8/10 (L lumbopelvic); 6/20: 3/10 (penetration), 5/10 (L lumbopelvic) Goal status: IN PROGRESS    PLAN: Rehab frequency: 1x/week  Rehab duration: 12 weeks  Planned interventions: Therapeutic exercises, Therapeutic activity, Neuromuscular re-education, Balance training, Gait training, Patient/Family education, Joint mobilization, Orthotic/Fit training, Electrical stimulation, Spinal manipulation, Spinal mobilization, Cryotherapy, Moist heat, scar mobilization, Taping, and Manual therapy   Myles Gip PT, DPT [phone]  11/10/2021, 1:55 PM

## 2021-11-26 ENCOUNTER — Encounter: Payer: Self-pay | Admitting: Physical Therapy

## 2021-12-24 ENCOUNTER — Ambulatory Visit (INDEPENDENT_AMBULATORY_CARE_PROVIDER_SITE_OTHER): Payer: BC Managed Care – PPO | Admitting: Nurse Practitioner

## 2021-12-24 ENCOUNTER — Encounter: Payer: Self-pay | Admitting: Nurse Practitioner

## 2021-12-24 VITALS — BP 105/65 | HR 87 | Temp 98.3°F | Resp 16 | Ht 63.0 in | Wt 142.4 lb

## 2021-12-24 DIAGNOSIS — E538 Deficiency of other specified B group vitamins: Secondary | ICD-10-CM | POA: Diagnosis not present

## 2021-12-24 DIAGNOSIS — Z0001 Encounter for general adult medical examination with abnormal findings: Secondary | ICD-10-CM | POA: Diagnosis not present

## 2021-12-24 DIAGNOSIS — E782 Mixed hyperlipidemia: Secondary | ICD-10-CM

## 2021-12-24 DIAGNOSIS — I10 Essential (primary) hypertension: Secondary | ICD-10-CM | POA: Diagnosis not present

## 2021-12-24 DIAGNOSIS — Z1231 Encounter for screening mammogram for malignant neoplasm of breast: Secondary | ICD-10-CM

## 2021-12-24 DIAGNOSIS — R7989 Other specified abnormal findings of blood chemistry: Secondary | ICD-10-CM

## 2021-12-24 DIAGNOSIS — N951 Menopausal and female climacteric states: Secondary | ICD-10-CM | POA: Diagnosis not present

## 2021-12-24 DIAGNOSIS — F5101 Primary insomnia: Secondary | ICD-10-CM

## 2021-12-24 DIAGNOSIS — R300 Dysuria: Secondary | ICD-10-CM

## 2021-12-24 MED ORDER — AMLODIPINE BESYLATE 10 MG PO TABS
10.0000 mg | ORAL_TABLET | Freq: Every day | ORAL | 3 refills | Status: DC
Start: 2021-12-24 — End: 2022-11-09

## 2021-12-24 MED ORDER — ZOLPIDEM TARTRATE 10 MG PO TABS: 10.0000 mg | ORAL_TABLET | Freq: Every evening | ORAL | 2 refills | Status: DC | PRN

## 2021-12-24 MED ORDER — VENLAFAXINE HCL ER 150 MG PO CP24: 150.0000 mg | ORAL_CAPSULE | Freq: Every day | ORAL | 3 refills | Status: DC

## 2021-12-24 MED ORDER — ESTRADIOL 0.1 MG/GM VA CREA: 1.0000 | TOPICAL_CREAM | Freq: Every day | VAGINAL | 12 refills | Status: DC

## 2021-12-24 NOTE — Progress Notes (Signed)
[facility] [place], [place] [address]  Internal MEDICINE  Office Visit Note  Patient Name: Lisa Osborn  [id number]  [id number]  Date of Service: 12/24/2021  Chief Complaint  Patient presents with   Annual Exam   Depression   Hypertension   Hyperlipidemia   Anxiety   Weight Loss    HPI [name] presents for an annual well visit and physical exam. Well-appearing 56 year old female with hypertension, migraines and history of ovarian cancer s/p surgical removal.  --lives at home with family, currently retired from [employer].  BP and other vital signs are normal --due for routine mammogram --still being followed by gynecologic oncology, mass was removed in October last year --colonoscopy was done in September last year.  Due for routine labs Denies any new or worsening pain, or other questions or concerns.     Current Medication: Outpatient Encounter Medications as of 12/24/2021  Medication Sig Note   EPINEPHrine 0.3 mg/0.3 mL IJ SOAJ injection Inject 0.3 mg into the muscle as needed for anaphylaxis. 03/23/2021: Prn    estradiol (ESTRACE) 0.1 MG/GM vaginal cream Place 1 Applicatorful vaginally at bedtime. Every night for 2 weeks then may decrease to twice weekly.    hydrochlorothiazide (HYDRODIURIL) 12.5 MG tablet Take 1 tablet (12.5 mg total) by mouth daily.    ibuprofen (ADVIL) 800 MG tablet Take 1 tablet (800 mg total) by mouth every 8 (eight) hours as needed for moderate pain. For AFTER surgery only 03/23/2021: Prn    [DISCONTINUED] amLODipine (NORVASC) 10 MG tablet TAKE 1 TABLET BY MOUTH EVERY DAY    [DISCONTINUED] venlafaxine XR (EFFEXOR-XR) 150 MG 24 hr capsule TAKE 1 CAPSULE BY MOUTH DAILY WITH BREAKFAST.    [DISCONTINUED] zolpidem (AMBIEN) 10 MG tablet Take 1 tablet (10 mg total) by mouth at bedtime as needed for sleep.    amLODipine (NORVASC) 10 MG tablet Take 1 tablet (10 mg total) by mouth daily.    venlafaxine XR (EFFEXOR-XR) 150 MG 24 hr  capsule Take 1 capsule (150 mg total) by mouth daily with breakfast.    zolpidem (AMBIEN) 10 MG tablet Take 1 tablet (10 mg total) by mouth at bedtime as needed for sleep.    No facility-administered encounter medications on file as of 12/24/2021.    Surgical History: Past Surgical History:  Procedure Laterality Date   CESAREAN SECTION     CHOLECYSTECTOMY     COLONOSCOPY WITH PROPOFOL N/A 02/16/2021   Procedure: COLONOSCOPY WITH PROPOFOL;  Surgeon: Lin Landsman, MD;  Location: [facility] ENDOSCOPY;  Service: Gastroenterology;  Laterality: N/A;   NOSE SURGERY     Surgery for deviated septum   OTHER SURGICAL HISTORY     Mesh placement for bladder prolapse versus urinary incontinence   ROBOTIC ASSISTED BILATERAL SALPINGO OOPHERECTOMY N/A 03/03/2021   Procedure: XI ROBOTIC ASSISTED BILATERAL SALPINGO OOPHORECTOMY,MINI LAPAROTOMY AND OMENTECTOMY, VAGINAL MASS EXCISION;  Surgeon: Lafonda Mosses, MD;  Location: [facility];  Service: Gynecology;  Laterality: N/A;   VAGINAL HYSTERECTOMY      Medical History: Past Medical History:  Diagnosis Date   Allergy    Anxiety    Depression    Gout 2020   none since then   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 in 20s and a second time in his 75s   Brain cancer Brother 18   Breast cancer Paternal Aunt        dx. 41s   Non-Hodgkin's lymphoma Paternal Aunt        dx. 6s   Thyroid cancer Paternal Aunt        dx. 35s   Melanoma Paternal Aunt        dx. 70s   Pancreatic cancer Paternal Uncle 74   Gastric cancer Paternal Uncle 37   Prostate cancer Paternal Uncle 57   Breast cancer Maternal Grandmother    Lung cancer Paternal Grandmother        smoked, dx. 13s   Lung  cancer Paternal Grandfather        smoked, dx. 50s   Breast cancer Cousin        pat cousin, dx. 67s    Social History   Socioeconomic History   Marital status: Married    Spouse name: Not on file   Number of children: Not on file   Years of education: Not on file   Highest education level: Not on file  Occupational History   Not on file  Tobacco Use   Smoking status: Never    Passive exposure: Past   Smokeless tobacco: Never  Vaping Use   Vaping Use: Never used  Substance and Sexual Activity   Alcohol use: Yes    Comment: ocassionally   Drug use: Never   Sexual activity: Not on file  Other Topics Concern   Not on file  Social History Narrative   Not on file   Social Determinants of Health   Financial Resource Strain: Not on file  Food Insecurity: Not on file  Transportation Needs: Not on file  Physical Activity: Not on file  Stress: Not on file  Social Connections: Not on file  Intimate Partner Violence: Not on file      Review of Systems  Constitutional:  Positive for fatigue. Negative for activity change, appetite change, chills, fever and unexpected weight change.  HENT: Negative.  Negative for congestion, ear pain, rhinorrhea, sore throat and trouble swallowing.   Eyes: Negative.   Respiratory: Negative.  Negative for cough, chest tightness, shortness of breath and wheezing.   Cardiovascular: Negative.  Negative for chest pain and palpitations.  Gastrointestinal: Negative.  Negative for abdominal pain, blood in stool, constipation, diarrhea, nausea and vomiting.  Endocrine: Positive for heat intolerance.  Genitourinary: Negative.  Negative for difficulty urinating, dysuria, frequency, hematuria and urgency.  Musculoskeletal: Negative.  Negative for arthralgias, back pain, joint swelling, myalgias and neck pain.  Skin: Negative.  Negative for rash and wound.  Allergic/Immunologic: Negative.  Negative for immunocompromised state.  Neurological: Negative.   Negative for dizziness, seizures, numbness and headaches.  Hematological: Negative.   Psychiatric/Behavioral:  Positive for depression. Negative for behavioral problems, self-injury and suicidal ideas. The patient is not nervous/anxious.     Vital Signs: BP 105/65   Pulse 87   Temp 98.3 F (36.8 C)   Resp 16   Ht '5\' 3"'$  (1.6 m)   Wt 142 lb 6.4 oz (64.6 kg)   SpO2 99%   BMI 25.23 kg/m    Physical Exam Vitals reviewed.  Constitutional:      General: She is awake. She is not in acute distress.    Appearance: Normal appearance. She  is well-developed, well-groomed and normal weight. She is not ill-appearing or diaphoretic.  HENT:     Head: Normocephalic and atraumatic.     Right Ear: Tympanic membrane, ear canal and external ear normal.     Left Ear: Tympanic membrane, ear canal and external ear normal.     Nose: Nose normal. No congestion or rhinorrhea.     Mouth/Throat:     Lips: Pink.     Mouth: Mucous membranes are moist.     Pharynx: Oropharynx is clear. Uvula midline. No oropharyngeal exudate.  Eyes:     General: Lids are normal. Vision grossly intact. Gaze aligned appropriately. No scleral icterus.       Right eye: No discharge.        Left eye: No discharge.     Conjunctiva/sclera: Conjunctivae normal.     Pupils: Pupils are equal, round, and reactive to light.     Funduscopic exam:    Right eye: Red reflex present.        Left eye: Red reflex present. Neck:     Thyroid: No thyromegaly.     Vascular: No JVD.     Trachea: Trachea and phonation normal. No tracheal deviation.  Cardiovascular:     Rate and Rhythm: Normal rate and regular rhythm.     Pulses: Normal pulses.     Heart sounds: Normal heart sounds, S1 normal and S2 normal. No murmur heard.    No friction rub. No gallop.  Pulmonary:     Effort: Pulmonary effort is normal. No accessory muscle usage or respiratory distress.     Breath sounds: Normal breath sounds and air entry. No stridor. No decreased  breath sounds, wheezing or rales.  Chest:     Chest wall: No tenderness.     Comments: Declined today, breast exam was done by gynecologic oncology recently.  Abdominal:     General: Bowel sounds are normal. There is no distension.     Palpations: Abdomen is soft. There is no mass.     Tenderness: There is no abdominal tenderness. There is no guarding or rebound.  Musculoskeletal:        General: No tenderness or deformity. Normal range of motion.     Cervical back: Normal range of motion and neck supple.     Right lower leg: No edema.     Left lower leg: No edema.  Lymphadenopathy:     Cervical: No cervical adenopathy.  Skin:    General: Skin is warm and dry.     Capillary Refill: Capillary refill takes less than 2 seconds.     Coloration: Skin is not pale.     Findings: No erythema or rash.  Neurological:     Mental Status: She is alert and oriented to person, place, and time.     Cranial Nerves: No cranial nerve deficit.     Motor: No abnormal muscle tone.     Coordination: Coordination normal.     Gait: Gait normal.     Deep Tendon Reflexes: Reflexes are normal and symmetric.  Psychiatric:        Mood and Affect: Mood normal.        Behavior: Behavior normal. Behavior is cooperative.        Thought Content: Thought content normal.        Judgment: Judgment normal.        Assessment/Plan: 1. Encounter for general adult medical examination with abnormal findings Age-appropriate preventive screenings and vaccinations discussed, annual physical  exam completed. Routine labs for health maintenance ordered, see below. PHM updated.  - Hepatic function panel  2. Vasomotor symptoms due to menopause Venlafaxine remains effectice, refills ordered. Labs orderd - Hepatic function panel - venlafaxine XR (EFFEXOR-XR) 150 MG 24 hr capsule; Take 1 capsule (150 mg total) by mouth daily with breakfast.  Dispense: 90 capsule; Refill: 3  3. Essential hypertension Routine labs ordered,  BP stable with current medication, refills ordered - Hepatic function panel - amLODipine (NORVASC) 10 MG tablet; Take 1 tablet (10 mg total) by mouth daily.  Dispense: 90 tablet; Refill: 3  4. Elevated ferritin level Routine labs ordered - Iron, TIBC and Ferritin Panel - Hepatic function panel  5. B12 deficiency Routine labs ordered - B12 and Folate Panel - Hepatic function panel  6. Mixed hyperlipidemia Routine labs ordered - Lipid Profile - Hepatic function panel  7. Encounter for screening mammogram for malignant neoplasm of breast Routine mammogram ordered - MM 3D SCREEN BREAST BILATERAL; Future  8. Dysuria Routine urinalysis done - UA/M w/rflx Culture, Routine  9. Primary insomnia Routine hepatic panel ordered, ambien refill ordered - Hepatic function panel - zolpidem (AMBIEN) 10 MG tablet; Take 1 tablet (10 mg total) by mouth at bedtime as needed for sleep.  Dispense: 30 tablet; Refill: 2      General Counseling: Niobe verbalizes understanding of the findings of todays visit and agrees with plan of treatment. I have discussed any further diagnostic evaluation that may be needed or ordered today. We also reviewed her medications today. she has been encouraged to call the office with any questions or concerns that should arise related to todays visit.    Orders Placed This Encounter  Procedures   MM 3D SCREEN BREAST BILATERAL   Lipid Profile   B12 and Folate Panel   Iron, TIBC and Ferritin Panel   Hepatic function panel    Meds ordered this encounter  Medications   venlafaxine XR (EFFEXOR-XR) 150 MG 24 hr capsule    Sig: Take 1 capsule (150 mg total) by mouth daily with breakfast.    Dispense:  90 capsule    Refill:  3    For future refills   amLODipine (NORVASC) 10 MG tablet    Sig: Take 1 tablet (10 mg total) by mouth daily.    Dispense:  90 tablet    Refill:  3    For future refills   zolpidem (AMBIEN) 10 MG tablet    Sig: Take 1 tablet (10 mg  total) by mouth at bedtime as needed for sleep.    Dispense:  30 tablet    Refill:  2    Please do not run with insurance. Will have [facility] card.   estradiol (ESTRACE) 0.1 MG/GM vaginal cream    Sig: Place 1 Applicatorful vaginally at bedtime. Every night for 2 weeks then may decrease to twice weekly.    Dispense:  42.5 g    Refill:  12    Return for F/U, Weight loss, [place] Dresser PCP metabolic test in 1-2 months, patient preference. .   Total time spent:30 Minutes Time spent includes review of chart, medications, test results, and follow up plan with the patient.   [place] Controlled Substance Database was reviewed by me.  This patient was seen by Jonetta Osgood, FNP-C in collaboration with Dr. Clayborn Bigness as a part of collaborative care agreement.  Ivannia Willhelm R. Valetta Fuller, MSN, FNP-C Internal medicine

## 2021-12-25 LAB — MICROSCOPIC EXAMINATION
Bacteria, UA: NONE SEEN
Casts: NONE SEEN /lpf
Epithelial Cells (non renal): NONE SEEN /hpf (ref 0–10)
RBC, Urine: NONE SEEN /hpf (ref 0–2)
WBC, UA: NONE SEEN /hpf (ref 0–5)

## 2021-12-25 LAB — UA/M W/RFLX CULTURE, ROUTINE
Bilirubin, UA: NEGATIVE
Glucose, UA: NEGATIVE
Ketones, UA: NEGATIVE
Leukocytes,UA: NEGATIVE
Nitrite, UA: NEGATIVE
Protein,UA: NEGATIVE
RBC, UA: NEGATIVE
Specific Gravity, UA: 1.019 (ref 1.005–1.030)
Urobilinogen, Ur: 1 mg/dL (ref 0.2–1.0)
pH, UA: 8.5 — ABNORMAL HIGH (ref 5.0–7.5)

## 2022-01-26 IMAGING — CT CT ABD-PELV W/ CM
3 of 5 series · 16 of 46 positions shown, 18 images · IV contrast (APPLIED)
Comparison: 02/17/2021

CLINICAL DATA: Postop abdominal pain.  Rule out incisional hernia.

EXAM:
CT ABDOMEN AND PELVIS WITH CONTRAST
TECHNIQUE: Multidetector CT imaging of the abdomen and pelvis was performed
using the standard protocol following bolus administration of
intravenous contrast.
CONTRAST:  80mL OMNIPAQUE IOHEXOL 350 MG/ML SOLN

[Series 2: axial st · axial · 0.71mm/px · z∈[-466,-106]mm · 10 of 90 slices shown, 12 images]
[im 9/90  soft-tissue]
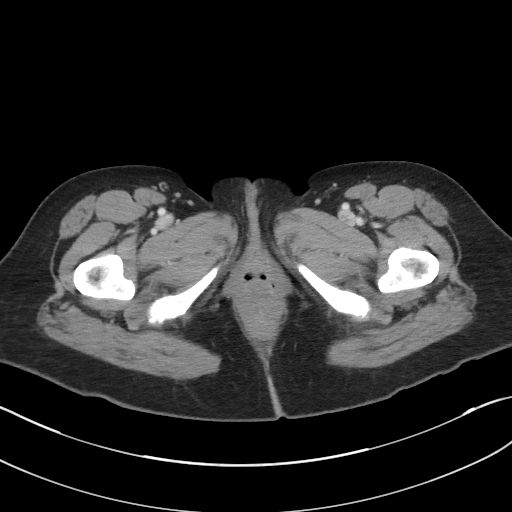
[im 9/90  bone]
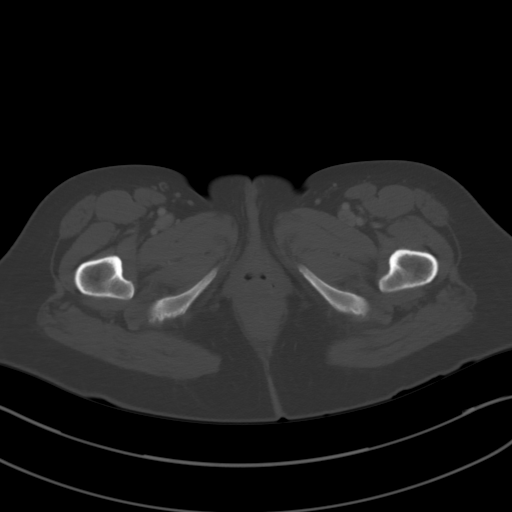
[im 17/90  soft-tissue]
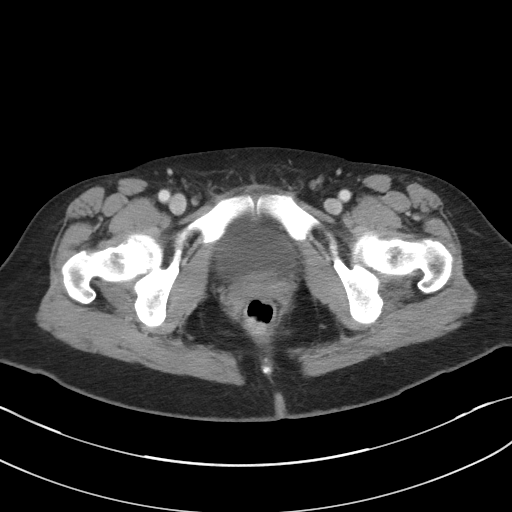
[im 25/90  soft-tissue]
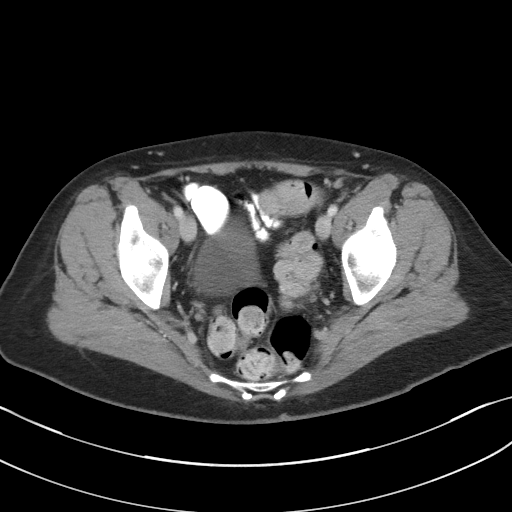
[im 33/90  soft-tissue]
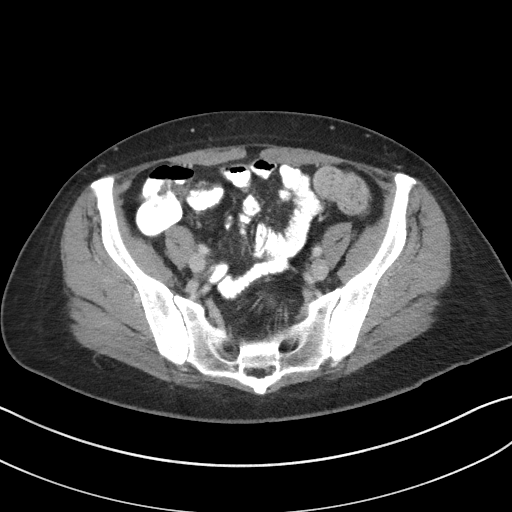
[im 41/90  soft-tissue]
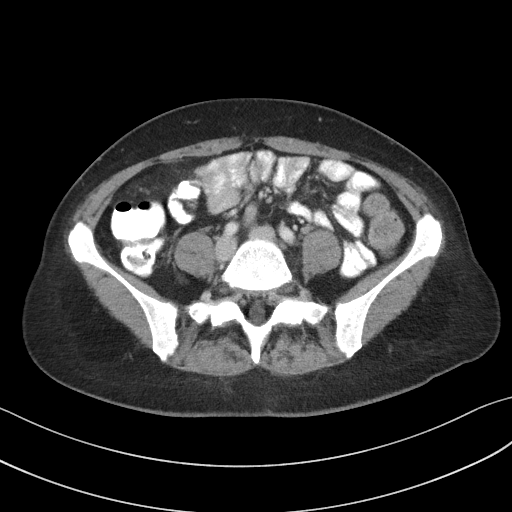
[im 49/90  soft-tissue]
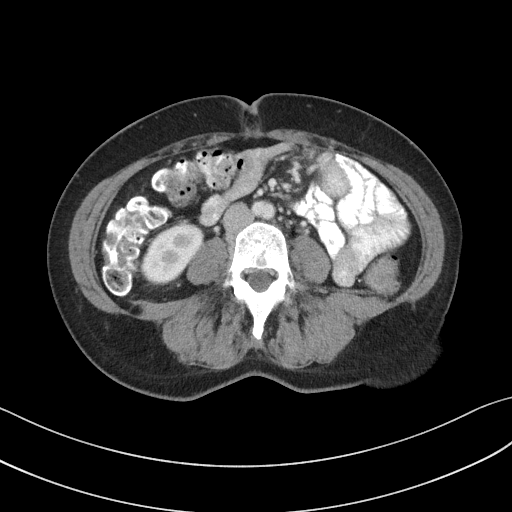
[im 57/90  soft-tissue]
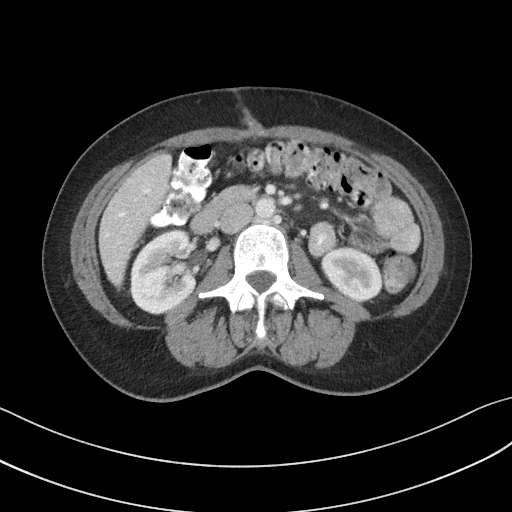
[im 65/90  soft-tissue]
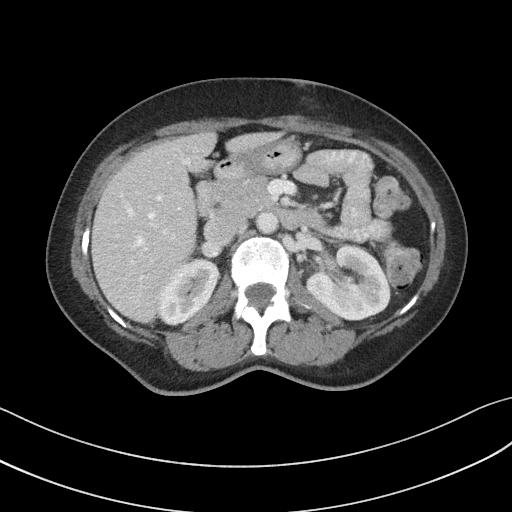
[im 73/90  soft-tissue]
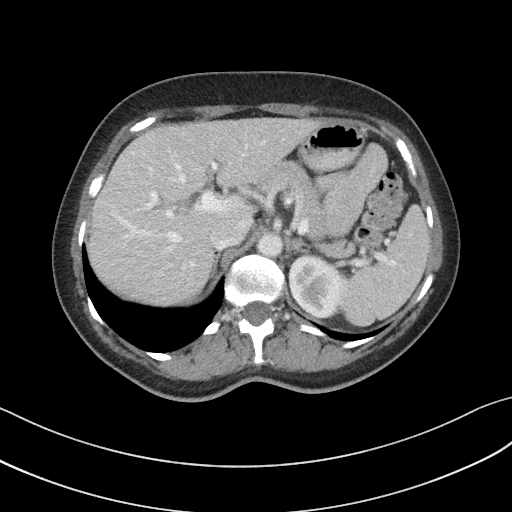
[im 73/90  bone]
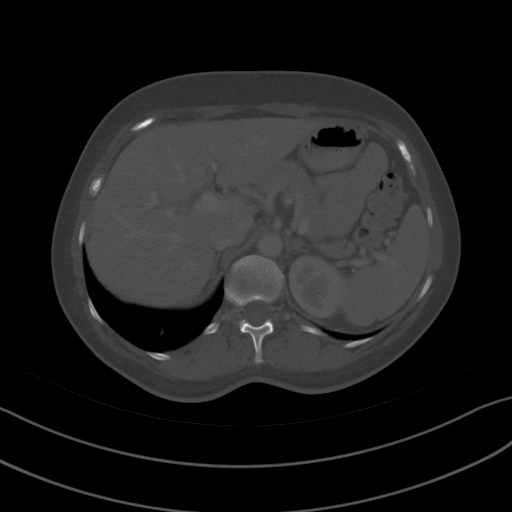
[im 81/90  soft-tissue]
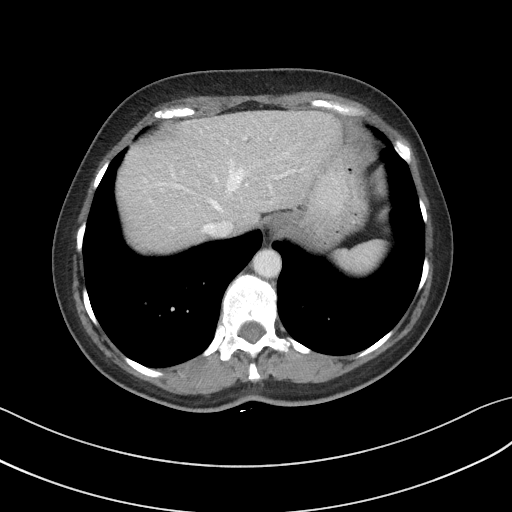

[Series 4: coronal st · coronal · 0.67mm/px · 3 of 89 slices shown]
[im 30/89  soft-tissue]
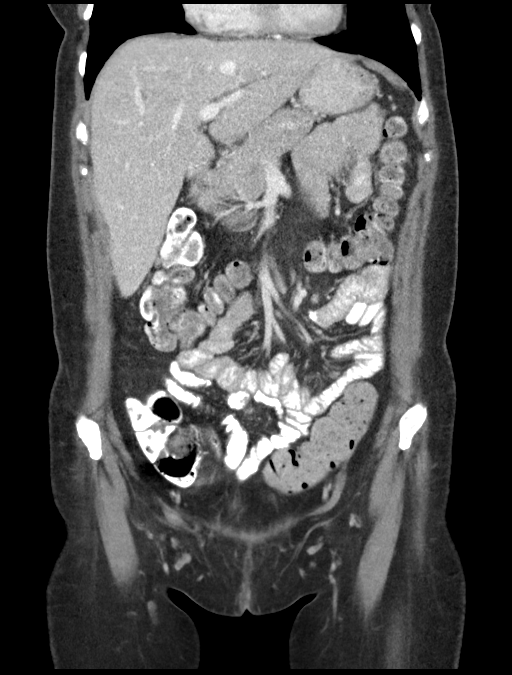
[im 40/89  soft-tissue]
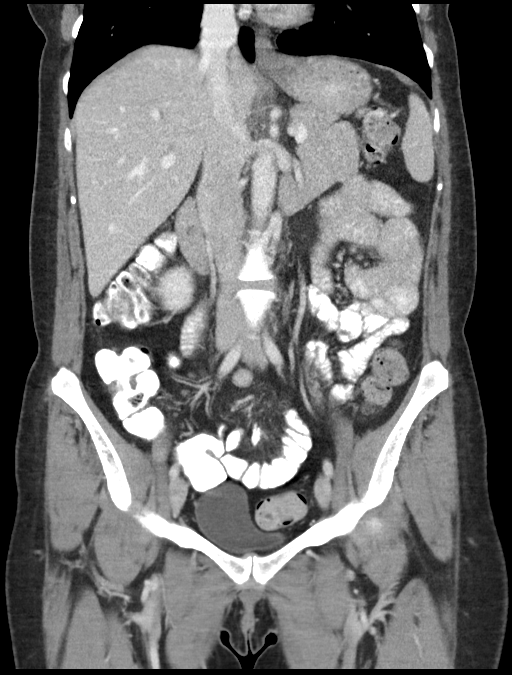
[im 49/89  soft-tissue]
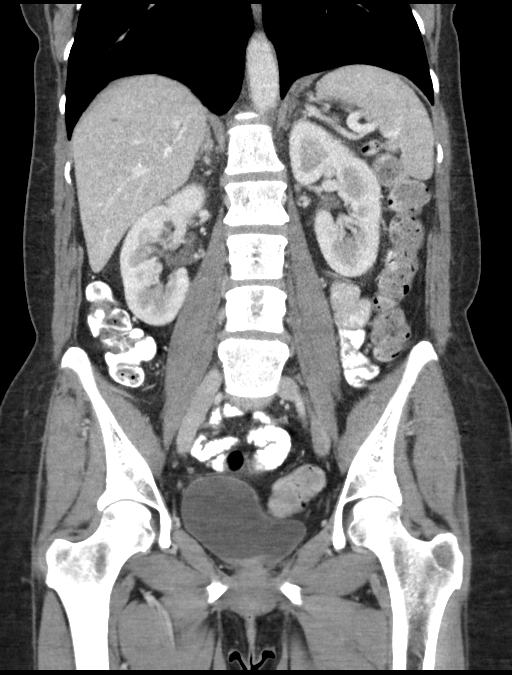

[Series 6: lung bases · axial · 0.56mm/px · z∈[-168,-138]mm · 3 of 62 slices shown]
[im 8/62  bone]
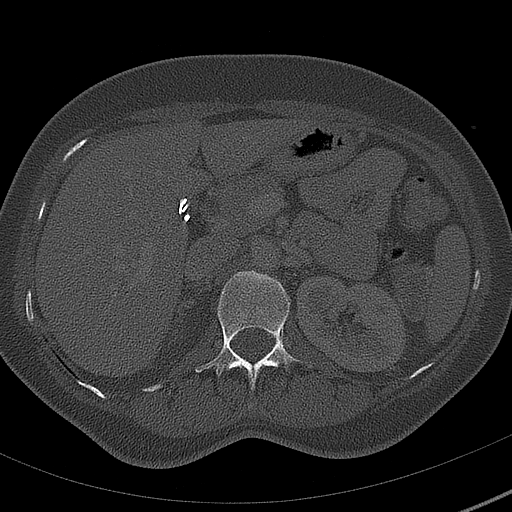
[im 16/62  bone]
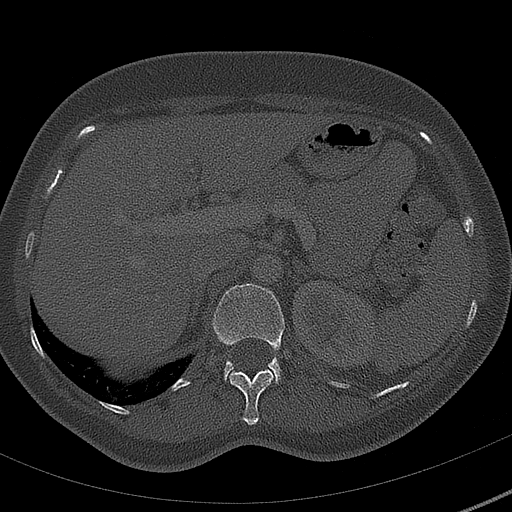
[im 23/62  bone]
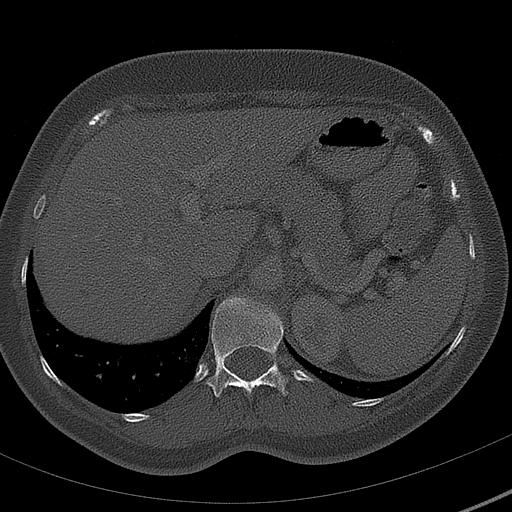

[16 of 46 positions shown; findings below may reference images not displayed]

FINDINGS: Lower chest: Lung bases are clear. No effusions. Heart is normal
size.

Hepatobiliary: No focal liver abnormality is seen. Status post
cholecystectomy. No biliary dilatation.

Pancreas: No focal abnormality or ductal dilatation.

Spleen: No focal abnormality.  Normal size.

Adrenals/Urinary Tract: No adrenal abnormality. No focal renal
abnormality. No stones or hydronephrosis. Urinary bladder is
unremarkable.

Stomach/Bowel: Normal appendix. Moderate stool throughout the colon.
Stomach, large and small bowel grossly unremarkable.

Vascular/Lymphatic: No evidence of aneurysm or adenopathy.

Reproductive: Prior hysterectomy.  No adnexal mass.

Other: No free fluid or free air.

Musculoskeletal: Small umbilical hernia containing fat, stable. No
evidence of incisional hernia.
IMPRESSION: No acute findings in the abdomen or pelvis.

Small stable umbilical hernia.

## 2022-02-07 ENCOUNTER — Encounter: Payer: Self-pay | Admitting: Nurse Practitioner

## 2022-02-22 ENCOUNTER — Encounter: Payer: Self-pay | Admitting: Nurse Practitioner

## 2022-02-22 ENCOUNTER — Ambulatory Visit: Payer: BC Managed Care – PPO | Admitting: Nurse Practitioner

## 2022-02-22 VITALS — BP 120/80 | HR 90 | Temp 98.1°F | Resp 16 | Ht 63.0 in | Wt 141.1 lb

## 2022-02-22 DIAGNOSIS — I10 Essential (primary) hypertension: Secondary | ICD-10-CM | POA: Diagnosis not present

## 2022-02-22 DIAGNOSIS — N951 Menopausal and female climacteric states: Secondary | ICD-10-CM

## 2022-02-22 DIAGNOSIS — Z6825 Body mass index (BMI) 25.0-25.9, adult: Secondary | ICD-10-CM | POA: Diagnosis not present

## 2022-02-22 NOTE — Progress Notes (Signed)
[facility] [place],  [address]  Internal MEDICINE  Office Visit Note  Patient Name: Lisa Osborn  [id number]  [id number]  Date of Service: 02/22/2022  Chief Complaint  Patient presents with   Follow-up   Medical Management of Chronic Issues    Weight managment   Depression   Hypertension    HPI Lisa Osborn presents for a follow up visit to discuss weight loss management.  BP is controlled, no refills needed, sleeping well with ambien and vasomotor symptoms are under control.  --Her current weight is 141 lbs and her BMI is borderline at 24.99.  --She is interested in trying medication for weight loss but her BMI is within normal range although it is borderline.  --previously discussed doing a metabolic test to figure out how many calories she is burning at her BMR and calculating how much her calorie intake should be to facilitate weight loss. She was agreeable to this plan so the metabolic test was done in the office today.  --metabolic testing determined that her BMR is faster than normal/average. BMR is 1944 calories. Her weight loss calorie range should be 1556-1944 calories per day.       Current Medication: Outpatient Encounter Medications as of 02/22/2022  Medication Sig Note   amLODipine (NORVASC) 10 MG tablet Take 1 tablet (10 mg total) by mouth daily.    EPINEPHrine 0.3 mg/0.3 mL IJ SOAJ injection Inject 0.3 mg into the muscle as needed for anaphylaxis. 03/23/2021: Prn    estradiol (ESTRACE) 0.1 MG/GM vaginal cream Place 1 Applicatorful vaginally at bedtime. Every night for 2 weeks then may decrease to twice weekly.    hydrochlorothiazide (HYDRODIURIL) 12.5 MG tablet Take 1 tablet (12.5 mg total) by mouth daily.    ibuprofen (ADVIL) 800 MG tablet Take 1 tablet (800 mg total) by mouth every 8 (eight) hours as needed for moderate pain. For AFTER surgery only 03/23/2021: Prn    venlafaxine XR (EFFEXOR-XR) 150 MG 24 hr capsule Take 1 capsule  (150 mg total) by mouth daily with breakfast.    zolpidem (AMBIEN) 10 MG tablet Take 1 tablet (10 mg total) by mouth at bedtime as needed for sleep.    No facility-administered encounter medications on file as of 02/22/2022.    Surgical History: Past Surgical History:  Procedure Laterality Date   CESAREAN SECTION     CHOLECYSTECTOMY     COLONOSCOPY WITH PROPOFOL N/A 02/16/2021   Procedure: COLONOSCOPY WITH PROPOFOL;  Surgeon: Lin Landsman, MD;  Location: [facility] ENDOSCOPY;  Service: Gastroenterology;  Laterality: N/A;   NOSE SURGERY     Surgery for deviated septum   OTHER SURGICAL HISTORY     Mesh placement for bladder prolapse versus urinary incontinence   ROBOTIC ASSISTED BILATERAL SALPINGO OOPHERECTOMY N/A 03/03/2021   Procedure: XI ROBOTIC ASSISTED BILATERAL SALPINGO OOPHORECTOMY,MINI LAPAROTOMY AND OMENTECTOMY, VAGINAL MASS EXCISION;  Surgeon: Lafonda Mosses, MD;  Location: [facility] ORS;  Service: Gynecology;  Laterality: N/A;   VAGINAL HYSTERECTOMY      Medical History: Past Medical History:  Diagnosis Date   Allergy    Anxiety    Depression    Gout 2020   none since then   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 in 72s and a second time in his 16s   Brain cancer Brother 6   Breast cancer Paternal Aunt        dx. 47s   Non-Hodgkin's lymphoma Paternal Aunt        dx. 70s   Thyroid cancer Paternal Aunt        dx. 84s   Melanoma Paternal Aunt        dx. 25s   Pancreatic cancer Paternal Uncle 39   Gastric cancer Paternal Uncle 6   Prostate cancer Paternal Uncle 41   Breast cancer Maternal Grandmother    Lung cancer Paternal Grandmother        smoked, dx. 55s   Lung cancer Paternal  Grandfather        smoked, dx. 54s   Breast cancer Cousin        pat cousin, dx. 82s    Social History   Socioeconomic History   Marital status: Married    Spouse name: Not on file   Number of children: Not on file   Years of education: Not on file   Highest education level: Not on file  Occupational History   Not on file  Tobacco Use   Smoking status: Never    Passive exposure: Past   Smokeless tobacco: Never  Vaping Use   Vaping Use: Never used  Substance and Sexual Activity   Alcohol use: Yes    Comment: ocassionally   Drug use: Never   Sexual activity: Not on file  Other Topics Concern   Not on file  Social History Narrative   Not on file   Social Determinants of Health   Financial Resource Strain: Not on file  Food Insecurity: Not on file  Transportation Needs: Not on file  Physical Activity: Not on file  Stress: Not on file  Social Connections: Not on file  Intimate Partner Violence: Not on file      Review of Systems  Constitutional:  Negative for chills, fatigue and unexpected weight change.  HENT:  Negative for congestion, rhinorrhea, sneezing and sore throat.   Eyes:  Negative for redness.  Respiratory: Negative.  Negative for cough, chest tightness, shortness of breath and wheezing.   Cardiovascular: Negative.  Negative for chest pain and palpitations.  Gastrointestinal:  Negative for abdominal pain, constipation, diarrhea, nausea and vomiting.  Genitourinary:  Negative for dysuria and frequency.  Musculoskeletal:  Negative for arthralgias, back pain, joint swelling and neck pain.  Skin:  Negative for rash.  Neurological: Negative.  Negative for tremors and numbness.  Hematological:  Negative for adenopathy. Does not bruise/bleed easily.  Psychiatric/Behavioral:  Negative for behavioral problems (Depression), sleep disturbance and suicidal ideas. The patient is not nervous/anxious.     Vital Signs: BP 120/80   Pulse 90   Temp 98.1 F (36.7 C)    Resp 16   Ht '5\' 3"'$  (1.6 m)   Wt 141 lb 1.6 oz (64 kg)   SpO2 99%   BMI 24.99 kg/m    Physical Exam Vitals reviewed.  Constitutional:      General: She is not in acute distress.    Appearance: Normal appearance. She is normal weight. She is not ill-appearing.  HENT:     Head: Normocephalic and atraumatic.  Eyes:     Pupils: Pupils are equal, round, and reactive to light.  Cardiovascular:     Rate and Rhythm:  Normal rate and regular rhythm.  Pulmonary:     Effort: Pulmonary effort is normal. No respiratory distress.  Neurological:     Mental Status: She is alert and oriented to person, place, and time.  Psychiatric:        Mood and Affect: Mood normal.        Behavior: Behavior normal.        Assessment/Plan: 1. Essential hypertension Stable, continue medications as prescribed.   2. Vasomotor symptoms due to menopause Controlled with current dose of venlafaxine, continue as prescribed.   3. BMI 51.7-61.6,WVPXT Metabolic test done. Discussed findings, handouts provided, see below. Discussed easy-to-use free calorie tracking apps for smart phones. Instructed patient to incorporate recommended calorie intake, exercise or some form of physical activity and give this method 4-8 weeks. If she is still struggling to lose the weight (goal is 10-15 lbs per patient.) we can discuss other options, interventions or medications.  Obesity Counseling: Risk Assessment: An assessment of behavioral risk factors was made today and includes lack of exercise sedentary lifestyle, lack of portion control and poor dietary habits. The patient has been screened for diabetes in the past and a metabolic test to determine basal metabolic rate has been performed as of today  Risk Modification Advice: The patient was counseled on portion control guidelines. Safe recommendations on caloric restriction discussed with patient. Sample meals plans were provided for her specific recommended calorie intake  range. General guidelines on diet and lifestyle modifications were discussed at length. Introducing physical activity as tolerated and at the patient's ability level is recommended.  - Metabolic Test   General Counseling: deissy guilbert understanding of the findings of todays visit and agrees with plan of treatment. I have discussed any further diagnostic evaluation that may be needed or ordered today. We also reviewed her medications today. she has been encouraged to call the office with any questions or concerns that should arise related to todays visit.    Orders Placed This Encounter  Procedures   Metabolic Test    No orders of the defined types were placed in this encounter.   Return in about 4 weeks (around 03/22/2022) for F/U, Weight loss, Lizzette Carbonell PCP.   Total time spent:30 Minutes Time spent includes review of chart, medications, test results, and follow up plan with the patient.   [place] Controlled Substance Database was reviewed by me.  This patient was seen by Jonetta Osgood, FNP-C in collaboration with Dr. Clayborn Bigness as a part of collaborative care agreement.   Ebelyn Bohnet R. Valetta Fuller, MSN, FNP-C Internal medicine

## 2022-03-22 ENCOUNTER — Ambulatory Visit: Payer: BC Managed Care – PPO | Admitting: Nurse Practitioner

## 2022-03-31 ENCOUNTER — Other Ambulatory Visit: Payer: Self-pay | Admitting: Nurse Practitioner

## 2022-03-31 DIAGNOSIS — F5101 Primary insomnia: Secondary | ICD-10-CM

## 2022-03-31 NOTE — Telephone Encounter (Signed)
Last 02/22/22 next 8/24  need follow up appt

## 2022-04-04 ENCOUNTER — Encounter: Payer: Self-pay | Admitting: Nurse Practitioner

## 2022-04-25 ENCOUNTER — Other Ambulatory Visit: Payer: Self-pay | Admitting: Nurse Practitioner

## 2022-04-25 DIAGNOSIS — F5101 Primary insomnia: Secondary | ICD-10-CM

## 2022-04-27 ENCOUNTER — Other Ambulatory Visit: Payer: Self-pay | Admitting: Gynecologic Oncology

## 2022-04-27 ENCOUNTER — Encounter: Payer: Self-pay | Admitting: Gynecologic Oncology

## 2022-04-27 ENCOUNTER — Telehealth: Payer: Self-pay | Admitting: Surgery

## 2022-04-27 DIAGNOSIS — R140 Abdominal distension (gaseous): Secondary | ICD-10-CM

## 2022-04-27 DIAGNOSIS — R635 Abnormal weight gain: Secondary | ICD-10-CM

## 2022-04-27 DIAGNOSIS — D3912 Neoplasm of uncertain behavior of left ovary: Secondary | ICD-10-CM

## 2022-04-27 NOTE — Telephone Encounter (Signed)
Called patient to schedule for follow up appointment. Patient states she is experiencing unexplained weight gain, up 8 pounds then down 8 pounds, as much as 15-20 pounds in the last month. Patient states that she had testing done to check her metabolism and results were good. Patient did not know the name of specific testing done for this. She is scheduled to see her PCP on 12/8 regarding her concerns as well. Patient states she takes miralax and has regular daily bowel movements. She also states she had a colonoscopy less then a year ago and no concerns with those results either. Denies any pain, fevers, or vaginal bleeding. Still waking up 2-3x nightly with hot flashes/night sweats that feel like she's on fire from the inside out.

## 2022-04-27 NOTE — Telephone Encounter (Signed)
CT scan and labs scheduled for 12/19. Follow up appointment with Dr Berline Lopes scheduled for 12/26. Patient aware of appointments.

## 2022-04-27 NOTE — Telephone Encounter (Signed)
The patient should have had follow-up with me in October.  Please get her scheduled for follow-up ASAP.

## 2022-04-29 ENCOUNTER — Telehealth: Payer: Self-pay

## 2022-04-29 NOTE — Telephone Encounter (Signed)
Per Dr. Berline Lopes  I connected with Ms. Dobos about coming in for labs today or tomorrow instead of waiting for her CT?  Pt states she lives in [place] and has an appointment with her PCP at [facility] in [place] NP) tomorrow 12/8.   I called [facility] and Melia, (triage nurse) said they could send her to the lab at [facility]. Lab order faxed to [facility] [phone]).   Pt is aware and agrees to getting labs drawn at [facility] lab.

## 2022-04-30 ENCOUNTER — Other Ambulatory Visit
Admission: RE | Admit: 2022-04-30 | Discharge: 2022-04-30 | Disposition: A | Discharge status: Home / Self Care | Payer: BC Managed Care – PPO | Source: Ambulatory Visit | Attending: Nurse Practitioner | Admitting: Nurse Practitioner

## 2022-04-30 ENCOUNTER — Encounter: Payer: Self-pay | Admitting: Nurse Practitioner

## 2022-04-30 ENCOUNTER — Ambulatory Visit (INDEPENDENT_AMBULATORY_CARE_PROVIDER_SITE_OTHER): Payer: BC Managed Care – PPO | Admitting: Nurse Practitioner

## 2022-04-30 VITALS — BP 136/80 | HR 88 | Temp 97.7°F | Resp 16 | Ht 63.0 in | Wt 146.6 lb

## 2022-04-30 DIAGNOSIS — E538 Deficiency of other specified B group vitamins: Secondary | ICD-10-CM | POA: Diagnosis not present

## 2022-04-30 DIAGNOSIS — N951 Menopausal and female climacteric states: Secondary | ICD-10-CM | POA: Diagnosis not present

## 2022-04-30 DIAGNOSIS — R7989 Other specified abnormal findings of blood chemistry: Secondary | ICD-10-CM

## 2022-04-30 DIAGNOSIS — R635 Abnormal weight gain: Secondary | ICD-10-CM | POA: Insufficient documentation

## 2022-04-30 DIAGNOSIS — F5101 Primary insomnia: Secondary | ICD-10-CM

## 2022-04-30 DIAGNOSIS — D3912 Neoplasm of uncertain behavior of left ovary: Secondary | ICD-10-CM

## 2022-04-30 DIAGNOSIS — E782 Mixed hyperlipidemia: Secondary | ICD-10-CM

## 2022-04-30 DIAGNOSIS — R140 Abdominal distension (gaseous): Secondary | ICD-10-CM

## 2022-04-30 DIAGNOSIS — Z1231 Encounter for screening mammogram for malignant neoplasm of breast: Secondary | ICD-10-CM

## 2022-04-30 LAB — COMPREHENSIVE METABOLIC PANEL
ALT: 25 U/L (ref 0–44)
AST: 28 U/L (ref 15–41)
Albumin: 4.1 g/dL (ref 3.5–5.0)
Alkaline Phosphatase: 83 U/L (ref 38–126)
Anion gap: 6 (ref 5–15)
BUN: 18 mg/dL (ref 6–20)
CO2: 28 mmol/L (ref 22–32)
Calcium: 9.5 mg/dL (ref 8.9–10.3)
Chloride: 104 mmol/L (ref 98–111)
Creatinine, Ser: 0.5 mg/dL (ref 0.44–1.00)
GFR, Estimated: >60 mL/min (ref >60)
Glucose, Bld: 101 mg/dL — ABNORMAL HIGH (ref 70–99)
Potassium: 4.1 mmol/L (ref 3.5–5.1)
Sodium: 138 mmol/L (ref 135–145)
Total Bilirubin: 0.6 mg/dL (ref 0.3–1.2)
Total Protein: 8 g/dL (ref 6.5–8.1)

## 2022-04-30 LAB — LIPID PANEL
Cholesterol: 243 mg/dL — ABNORMAL HIGH (ref 0–200)
HDL: 77 mg/dL (ref >40)
LDL Cholesterol: 151 mg/dL — ABNORMAL HIGH (ref 0–99)
Total CHOL/HDL Ratio: 3.2 RATIO
Triglycerides: 75 mg/dL (ref <150)
VLDL: 15 mg/dL (ref 0–40)

## 2022-04-30 LAB — IRON AND TIBC
Iron: 73 ug/dL (ref 28–170)
Saturation Ratios: 16 % (ref 10.4–31.8)
TIBC: 462 ug/dL — ABNORMAL HIGH (ref 250–450)
UIBC: 389 ug/dL

## 2022-04-30 LAB — VITAMIN B12: Vitamin B-12: 313 pg/mL (ref 180–914)

## 2022-04-30 LAB — FERRITIN: Ferritin: 28 ng/mL (ref 11–307)

## 2022-04-30 LAB — FOLATE: Folate: 9.6 ng/mL (ref >5.9)

## 2022-04-30 MED ORDER — ZOLPIDEM TARTRATE 10 MG PO TABS: 10.0000 mg | ORAL_TABLET | Freq: Every evening | ORAL | 2 refills | Status: DC | PRN

## 2022-04-30 NOTE — Progress Notes (Unsigned)
[facility] [place], [place] [address]  Internal MEDICINE  Office Visit Note  Patient Name: Lisa Osborn  [id number]  [id number]  Date of Service: 04/30/2022  Chief Complaint  Patient presents with   Follow-up    Weight loss   Hypertension   Hyperlipidemia   Depression    HPI Lisa Osborn presents for a follow-up visit for  Tried intermittent fasting Can't lose weight  Feels depressed  Still having night sweats even with increased effexor     Current Medication: Outpatient Encounter Medications as of 04/30/2022  Medication Sig Note   amLODipine (NORVASC) 10 MG tablet Take 1 tablet (10 mg total) by mouth daily.    EPINEPHrine 0.3 mg/0.3 mL IJ SOAJ injection Inject 0.3 mg into the muscle as needed for anaphylaxis. 03/23/2021: Prn    estradiol (ESTRACE) 0.1 MG/GM vaginal cream Place 1 Applicatorful vaginally at bedtime. Every night for 2 weeks then may decrease to twice weekly.    hydrochlorothiazide (HYDRODIURIL) 12.5 MG tablet Take 1 tablet (12.5 mg total) by mouth daily.    ibuprofen (ADVIL) 800 MG tablet Take 1 tablet (800 mg total) by mouth every 8 (eight) hours as needed for moderate pain. For AFTER surgery only 03/23/2021: Prn    venlafaxine XR (EFFEXOR-XR) 150 MG 24 hr capsule Take 1 capsule (150 mg total) by mouth daily with breakfast.    zolpidem (AMBIEN) 10 MG tablet Take 1 tablet (10 mg total) by mouth at bedtime as needed. for sleep    [DISCONTINUED] zolpidem (AMBIEN) 10 MG tablet TAKE 1 TABLET BY MOUTH AT BEDTIME AS NEEDED FOR SLEEP.    No facility-administered encounter medications on file as of 04/30/2022.    Surgical History: Past Surgical History:  Procedure Laterality Date   CESAREAN SECTION     CHOLECYSTECTOMY     COLONOSCOPY WITH PROPOFOL N/A 02/16/2021   Procedure: COLONOSCOPY WITH PROPOFOL;  Surgeon: Lin Landsman, MD;  Location: [facility] ENDOSCOPY;  Service: Gastroenterology;  Laterality: N/A;   NOSE SURGERY     Surgery for  deviated septum   OTHER SURGICAL HISTORY     Mesh placement for bladder prolapse versus urinary incontinence   ROBOTIC ASSISTED BILATERAL SALPINGO OOPHERECTOMY N/A 03/03/2021   Procedure: XI ROBOTIC ASSISTED BILATERAL SALPINGO OOPHORECTOMY,MINI LAPAROTOMY AND OMENTECTOMY, VAGINAL MASS EXCISION;  Surgeon: Lafonda Mosses, MD;  Location: [facility] ORS;  Service: Gynecology;  Laterality: N/A;   VAGINAL HYSTERECTOMY      Medical History: Past Medical History:  Diagnosis Date   Allergy    Anxiety    Depression    Gout 2020   none since then   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 in 67s and a second time in his 87s   Brain cancer Brother 57   Breast cancer Paternal Aunt        dx. 43s   Non-Hodgkin's lymphoma Paternal Aunt        dx. 68s   Thyroid cancer Paternal Aunt        dx. 11s   Melanoma Paternal Aunt        dx. 50s   Pancreatic cancer Paternal Uncle  67   Gastric cancer Paternal Uncle 35   Prostate cancer Paternal Uncle 65   Breast cancer Maternal Grandmother    Lung cancer Paternal Grandmother        smoked, dx. 30s   Lung cancer Paternal Grandfather        smoked, dx. 65s   Breast cancer Cousin        pat cousin, dx. 53s    Social History   Socioeconomic History   Marital status: Married    Spouse name: Not on file   Number of children: Not on file   Years of education: Not on file   Highest education level: Not on file  Occupational History   Not on file  Tobacco Use   Smoking status: Never    Passive exposure: Past   Smokeless tobacco: Never  Vaping Use   Vaping Use: Never used  Substance and Sexual Activity   Alcohol use: Yes    Comment: ocassionally   Drug use: Never   Sexual  activity: Not on file  Other Topics Concern   Not on file  Social History Narrative   Not on file   Social Determinants of Health   Financial Resource Strain: Not on file  Food Insecurity: Not on file  Transportation Needs: Not on file  Physical Activity: Not on file  Stress: Not on file  Social Connections: Not on file  Intimate Partner Violence: Not on file      Review of Systems  Vital Signs: BP 136/80   Pulse 88   Temp 97.7 F (36.5 C)   Resp 16   Ht _0  (1.6 m)   Wt 146 lb 9.6 oz (66.5 kg)   SpO2 98%   BMI 25.97 kg/m    Physical Exam     Assessment/Plan:   General Counseling: Lisa Osborn verbalizes understanding of the findings of todays visit and agrees with plan of treatment. I have discussed any further diagnostic evaluation that may be needed or ordered today. We also reviewed her medications today. she has been encouraged to call the office with any questions or concerns that should arise related to todays visit.    Orders Placed This Encounter  Procedures   Lipid Profile   CMP14+EGFR   Iron, TIBC and Ferritin Panel   B12 and Folate Panel   Inhibin B   Anti mullerian hormone    Meds ordered this encounter  Medications   zolpidem (AMBIEN) 10 MG tablet    Sig: Take 1 tablet (10 mg total) by mouth at bedtime as needed. for sleep    Dispense:  30 tablet    Refill:  2    Not to exceed 3 additional fills before 06/22/2022 DX Code Needed  .    Return for 3 month follow up for ambien refill and 1 month follow for wt loss in january .   Total time spent:*** Minutes Time spent includes review of chart, medications, test results, and follow up plan with the patient.   [place] Controlled Substance Database was reviewed by me.  This patient was seen by Jonetta Osgood, FNP-C in collaboration with Dr. Clayborn Bigness as a part of collaborative care agreement.   Lisa Osborn R. Valetta Fuller, MSN, FNP-C Internal medicine

## 2022-05-01 ENCOUNTER — Encounter: Payer: Self-pay | Admitting: Nurse Practitioner

## 2022-05-03 LAB — INHIBIN B: Inhibin B: <7 pg/mL

## 2022-05-05 LAB — MISC LABCORP TEST (SEND OUT): Labcorp test code: 500183

## 2022-05-10 ENCOUNTER — Other Ambulatory Visit: Payer: BC Managed Care – PPO

## 2022-05-10 ENCOUNTER — Ambulatory Visit (HOSPITAL_COMMUNITY): Payer: BC Managed Care – PPO

## 2022-05-11 ENCOUNTER — Inpatient Hospital Stay: Payer: BC Managed Care – PPO | Attending: Nurse Practitioner

## 2022-05-11 ENCOUNTER — Other Ambulatory Visit: Payer: Self-pay | Admitting: Gynecologic Oncology

## 2022-05-11 ENCOUNTER — Ambulatory Visit (HOSPITAL_COMMUNITY)
Admission: RE | Admit: 2022-05-11 | Discharge: 2022-05-11 | Disposition: A | Discharge status: Home / Self Care | Payer: BC Managed Care – PPO | Source: Ambulatory Visit | Attending: Gynecologic Oncology | Admitting: Gynecologic Oncology

## 2022-05-11 DIAGNOSIS — R635 Abnormal weight gain: Secondary | ICD-10-CM | POA: Diagnosis present

## 2022-05-11 DIAGNOSIS — R140 Abdominal distension (gaseous): Secondary | ICD-10-CM | POA: Diagnosis present

## 2022-05-11 DIAGNOSIS — D3912 Neoplasm of uncertain behavior of left ovary: Secondary | ICD-10-CM

## 2022-05-11 MED ORDER — IOHEXOL 9 MG/ML PO SOLN
1000.0000 mL | ORAL | Status: AC
  Administered 2022-05-11: 1000 mL via ORAL

## 2022-05-11 MED ORDER — IOHEXOL 300 MG/ML  SOLN
80.0000 mL | Freq: Once | INTRAMUSCULAR | Status: AC | PRN
  Administered 2022-05-11: 80 mL via INTRAVENOUS

## 2022-05-11 MED ORDER — IOHEXOL 9 MG/ML PO SOLN
ORAL | Status: AC
  Filled 2022-05-11: qty 1000

## 2022-05-14 ENCOUNTER — Telehealth: Payer: Self-pay | Admitting: Gynecologic Oncology

## 2022-05-14 ENCOUNTER — Encounter: Payer: Self-pay | Admitting: Gynecologic Oncology

## 2022-05-14 DIAGNOSIS — R5383 Other fatigue: Secondary | ICD-10-CM

## 2022-05-14 DIAGNOSIS — R232 Flushing: Secondary | ICD-10-CM

## 2022-05-14 DIAGNOSIS — D3912 Neoplasm of uncertain behavior of left ovary: Secondary | ICD-10-CM

## 2022-05-14 MED ORDER — ESTRADIOL 0.05 MG/24HR TD PTWK: 0.0500 mg | MEDICATED_PATCH | TRANSDERMAL | 12 refills | Status: DC

## 2022-05-14 NOTE — Telephone Encounter (Signed)
Called the patient with CT scan results.  She has no evidence of metastatic disease.  Given continued symptoms of hot flashes, bloating, fatigue, I suspect that this may be hormonally driven.  Given her early stage disease, we have previously discussed possibility for estrogen replacement in the setting of her estrogen dependent tumor as well as hypertension.  Given her continued symptomatology, she would like to try estrogen replacement.  Estrogen patch sent to her pharmacy.  Jeral Pinch MD Gynecologic Oncology

## 2022-05-18 ENCOUNTER — Telehealth: Payer: Self-pay | Admitting: *Deleted

## 2022-05-18 ENCOUNTER — Inpatient Hospital Stay: Payer: BC Managed Care – PPO | Admitting: Gynecologic Oncology

## 2022-05-18 NOTE — Patient Instructions (Signed)
It was good to see you today.  I do not see or feel any evidence of cancer recurrence on your exam.  I will see you for follow-up in 6 months.  As always, if you develop any new and concerning symptoms before your next visit, please call to see me sooner.   

## 2022-05-18 NOTE — Telephone Encounter (Signed)
Per patient phone message appt for today canceled. Patient has the flu. Patient will need to be rescheduled and also scheduled for a 6 month follow up

## 2022-05-18 NOTE — Progress Notes (Unsigned)
Gynecologic Oncology Return Clinic Visit  05/18/22  Reason for Visit: Follow-up in the setting of history of granulosa cell tumor   Treatment History: 10/11: Robotic BSO, mini-lap, omentectomy. Pathology shows adult granulosa cell tumor Due to pain postoperatively, patient had a CT scan on 05/08/2021 to rule out intra-abdominal process and development of a incisional hernia.  No acute findings were noted on this scan, small stable umbilical hernia seen.   CT A/P 08/31/21: 1. New small ventral hernia superior to the umbilicus. 2. No other acute localizing process in the abdomen or pelvis.  CT A/P 05/11/22: 1. No evidence of metastatic disease in the abdomen or pelvis. 2. No acute abnormality. No evidence of bowel obstruction or acute bowel inflammation. 3.  Aortic Atherosclerosis (ICD10-I70.0).  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   HLD (hyperlipidemia)    Hypertension     Past Surgical History:  Procedure Laterality Date   CESAREAN SECTION     CHOLECYSTECTOMY     COLONOSCOPY WITH PROPOFOL N/A 02/16/2021   Procedure: COLONOSCOPY WITH PROPOFOL;  Surgeon: Lin Landsman, MD;  Location: [facility] ENDOSCOPY;  Service: Gastroenterology;  Laterality: N/A;   NOSE SURGERY     Surgery for deviated septum   OTHER SURGICAL HISTORY     Mesh placement for bladder prolapse versus urinary incontinence   ROBOTIC ASSISTED BILATERAL SALPINGO OOPHERECTOMY N/A 03/03/2021   Procedure: XI ROBOTIC ASSISTED BILATERAL SALPINGO OOPHORECTOMY,MINI LAPAROTOMY AND OMENTECTOMY, VAGINAL MASS EXCISION;  Surgeon: Lafonda Mosses, MD;  Location: [facility];  Service: Gynecology;  Laterality: N/A;   VAGINAL HYSTERECTOMY      Family History  Problem Relation Age of Onset   Hyperlipidemia Mother    Hyperlipidemia Father    Hypertension  Father    Renal cancer Father 83   Prostate cancer Father 6   Melanoma Father        dx. in 38s and a second time in his 95s   Brain cancer Brother 35   Breast cancer Paternal Aunt        dx. 60s   Non-Hodgkin's lymphoma Paternal Aunt        dx. 29s   Thyroid cancer Paternal Aunt        dx. 32s   Melanoma Paternal Aunt        dx. 20s   Pancreatic cancer Paternal Uncle 26   Gastric cancer Paternal Uncle 75   Prostate cancer Paternal Uncle 50   Breast cancer Maternal Grandmother    Lung cancer Paternal Grandmother        smoked, dx. 52s   Lung cancer Paternal Grandfather        smoked, dx. 7s   Breast cancer Cousin        pat cousin, dx. 51s    Social History   Socioeconomic History   Marital status: Married    Spouse name: Not on file   Number of children: Not on file   Years of education: Not on file   Highest education level: Not on file  Occupational History   Not on file  Tobacco Use   Smoking status: Never    Passive exposure: Past   Smokeless tobacco: Never  Vaping Use   Vaping Use: Never used  Substance and Sexual Activity   Alcohol use:  Yes    Comment: ocassionally   Drug use: Never   Sexual activity: Not on file  Other Topics Concern   Not on file  Social History Narrative   Not on file   Social Determinants of Health   Financial Resource Strain: Not on file  Food Insecurity: Not on file  Transportation Needs: Not on file  Physical Activity: Not on file  Stress: Not on file  Social Connections: Not on file    Current Medications:  Current Outpatient Medications:    amLODipine (NORVASC) 10 MG tablet, Take 1 tablet (10 mg total) by mouth daily., Disp: 90 tablet, Rfl: 3   EPINEPHrine 0.3 mg/0.3 mL IJ SOAJ injection, Inject 0.3 mg into the muscle as needed for anaphylaxis., Disp: , Rfl:    hydrochlorothiazide (HYDRODIURIL) 12.5 MG tablet, Take 1 tablet (12.5 mg total) by mouth daily., Disp: 90 tablet, Rfl: 3   ibuprofen (ADVIL) 800 MG tablet,  Take 1 tablet (800 mg total) by mouth every 8 (eight) hours as needed for moderate pain. For AFTER surgery only, Disp: 30 tablet, Rfl: 0   venlafaxine XR (EFFEXOR-XR) 150 MG 24 hr capsule, Take 1 capsule (150 mg total) by mouth daily with breakfast., Disp: 90 capsule, Rfl: 3   zolpidem (AMBIEN) 10 MG tablet, Take 1 tablet (10 mg total) by mouth at bedtime as needed. for sleep, Disp: 30 tablet, Rfl: 2   estradiol (CLIMARA) 0.05 mg/24hr patch, Place 1 patch (0.05 mg total) onto the skin once a week., Disp: 4 patch, Rfl: 12  Review of Systems: Denies appetite changes, fevers, chills, fatigue, unexplained weight changes. Denies hearing loss, neck lumps or masses, mouth sores, ringing in ears or voice changes. Denies cough or wheezing.  Denies shortness of breath. Denies chest pain or palpitations. Denies leg swelling. Denies abdominal distention, pain, blood in stools, constipation, diarrhea, nausea, vomiting, or early satiety. Denies pain with intercourse, dysuria, frequency, hematuria or incontinence. Denies hot flashes, pelvic pain, vaginal bleeding or vaginal discharge.   Denies joint pain, back pain or muscle pain/cramps. Denies itching, rash, or wounds. Denies dizziness, headaches, numbness or seizures. Denies swollen lymph nodes or glands, denies easy bruising or bleeding. Denies anxiety, depression, confusion, or decreased concentration.  Physical Exam: There were no vitals taken for this visit. General: ***Alert, oriented, no acute distress. HEENT: ***Posterior oropharynx clear, sclera anicteric. Chest: ***Clear to auscultation bilaterally.  ***Port site clean. Cardiovascular: ***Regular rate and rhythm, no murmurs. Abdomen: ***Obese, soft, nontender.  Normoactive bowel sounds.  No masses or hepatosplenomegaly appreciated.  ***Well-healed scar. Extremities: ***Grossly normal range of motion.  Warm, well perfused.  No edema bilaterally. Skin: ***No rashes or lesions noted. Lymphatics:  ***No cervical, supraclavicular, or inguinal adenopathy. GU: Normal appearing external genitalia without erythema, excoriation, or lesions.  Speculum exam reveals ***.  Bimanual exam reveals ***.  ***Rectovaginal exam  confirms ___.  Laboratory & Radiologic Studies: Component Ref Range & Units 2 wk ago 1 yr ago  Inhibin B pg/mL <7.0 6,282.4 High  R,    Assessment & Plan: Lisa Osborn is a 56 y.o. 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   On exam, patient is NED.  I am a little concerned about her bloating.  She thinks that this is unrelated to more recent history of constipation.  I have encouraged her to get MiraLAX and use this daily for the next couple of weeks to see if she can soften her  bowel movements.  In the setting of her bloating, I recommend CT scan to rule out granulosa cell recurrence.  I will call her with the results after her scan is done.   She endorses dyspareunia, which she describes as pain internally with some radiation to her mini lap incision.  On exam, she is tender along the vaginal cuff.  I think she might benefit from pelvic floor physical therapy.  She was interested in this intervention.  I will look for pelvic floor physical therapist closer to her home in [place].  If I cannot find somebody closer, she is willing to drive to [place].   Will plan to get Inhibin B and AMH today.   With regard to her menopausal symptoms, she has been on Effexor without much relief.  We discussed the risks and benefits of estrogen replacement in the setting of her estrogen dependent tumor as well as her hypertension.  At this time, we will plan to discuss this further after CT has been performed.   We discussed that follow-up will include surveillance visits every 6 months with an exam and lab test.  She will require long-term follow-up given the risk of recurrence at long intervals from original diagnosis.  We reviewed signs  and symptoms that would be concerning for cancer recurrence, and I stressed the importance that she call if she develops any of these between visits.  *** minutes of total time was spent for this patient encounter, including preparation, face-to-face counseling with the patient and coordination of care, and documentation of the encounter.  Jeral Pinch, MD  Division of Gynecologic Oncology  Department of Obstetrics and Gynecology  [facility]

## 2022-05-21 IMAGING — CT CT ABD-PELV W/ CM
2 of 5 series · 15 of 46 positions shown, 17 images · IV contrast (APPLIED)
Comparison: CT abdomen and pelvis 05/08/2021.

CLINICAL DATA: Acute abdominal pain.

EXAM:
CT ABDOMEN AND PELVIS WITH CONTRAST
TECHNIQUE: Multidetector CT imaging of the abdomen and pelvis was performed
using the standard protocol following bolus administration of
intravenous contrast.

[Series 2: axial st · axial · 0.77mm/px · z∈[-794,-414]mm · 12 of 90 slices shown, 14 images]
[im 7/90  soft-tissue]
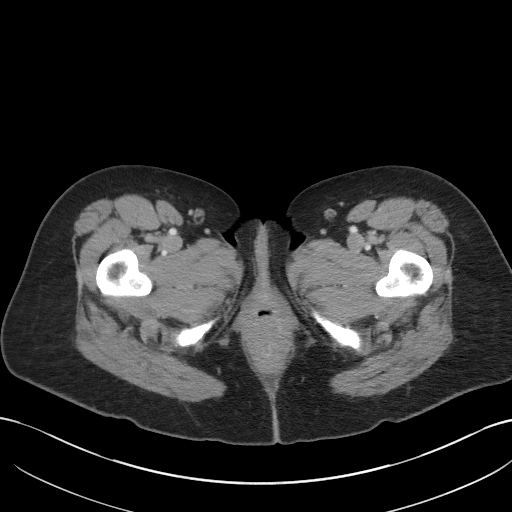
[im 7/90  bone]
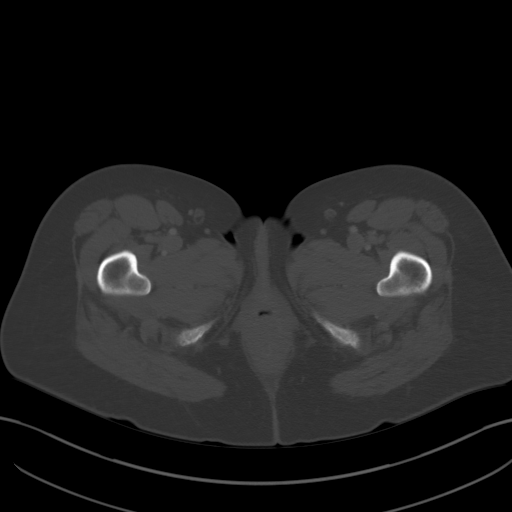
[im 14/90  soft-tissue]
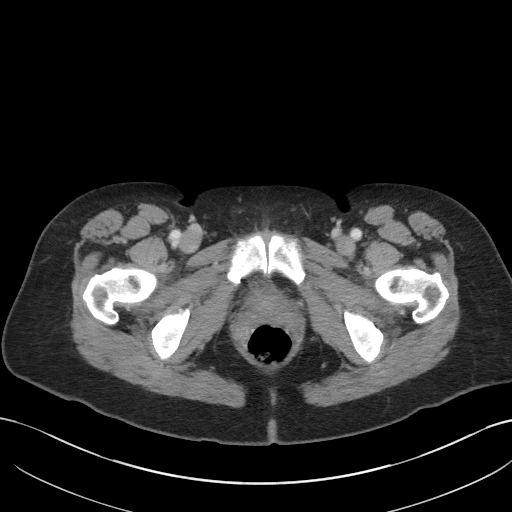
[im 21/90  soft-tissue]
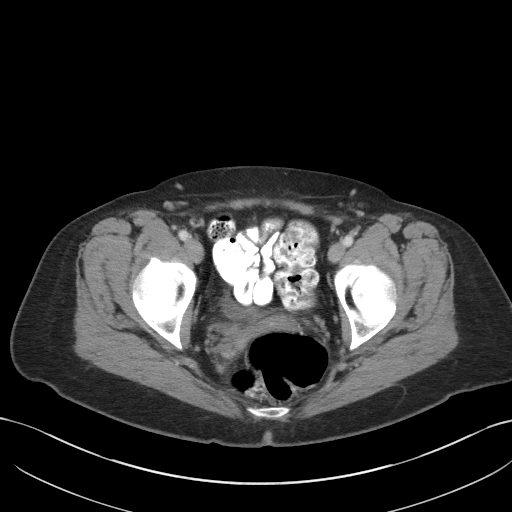
[im 28/90  soft-tissue]
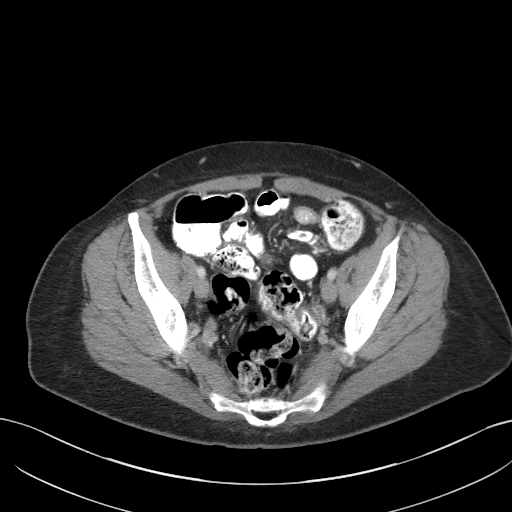
[im 35/90  soft-tissue]
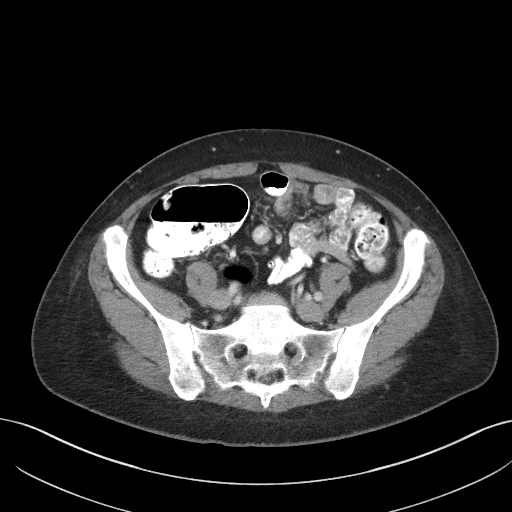
[im 42/90  soft-tissue]
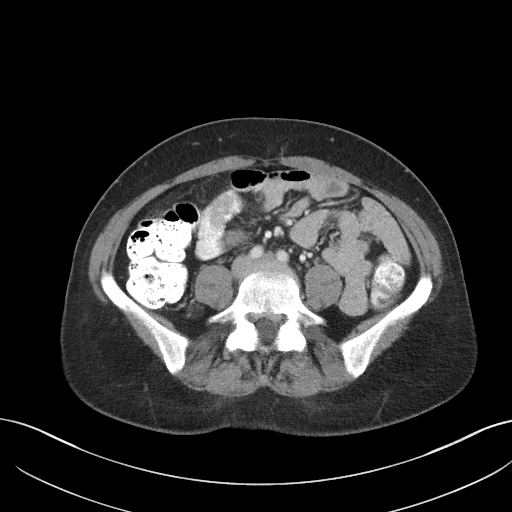
[im 48/90  soft-tissue]
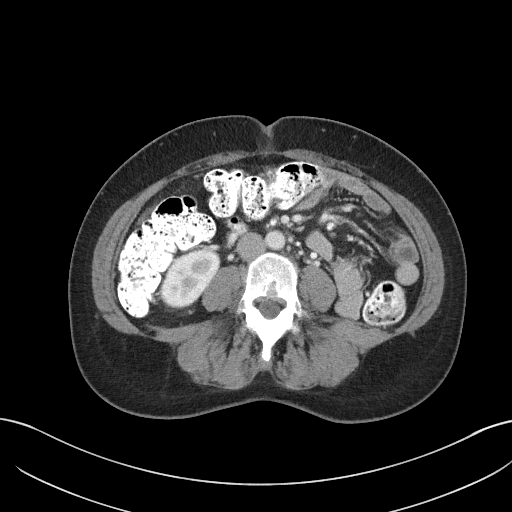
[im 55/90  soft-tissue]
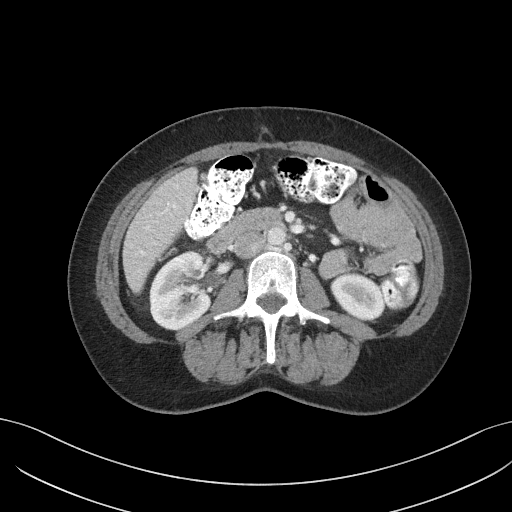
[im 62/90  soft-tissue]
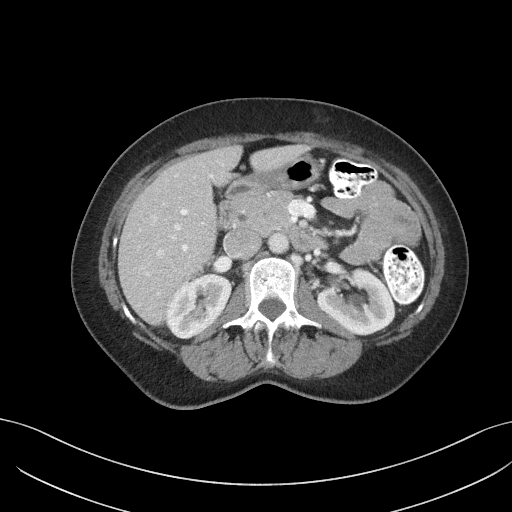
[im 62/90  bone]
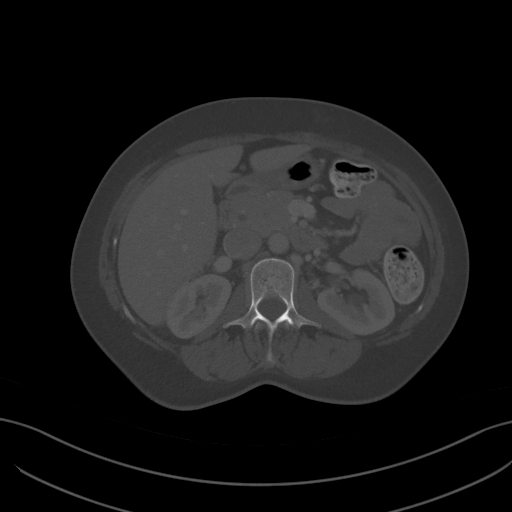
[im 69/90  soft-tissue]
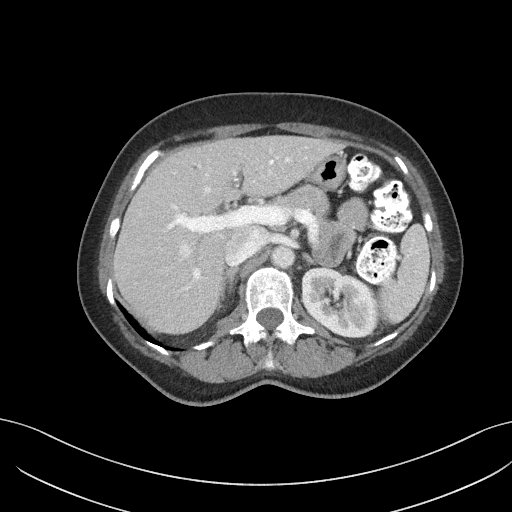
[im 76/90  soft-tissue]
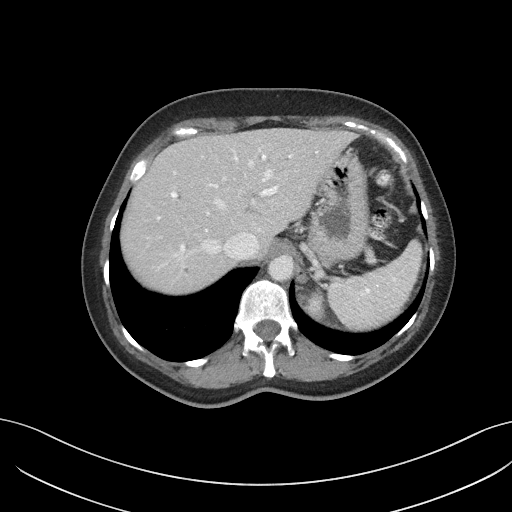
[im 83/90  soft-tissue]
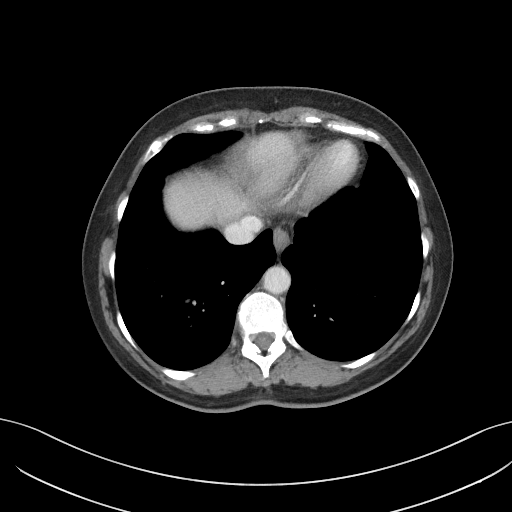

[Series 5: coronal st · coronal · 0.68mm/px · 3 of 94 slices shown]
[im 32/94  soft-tissue]
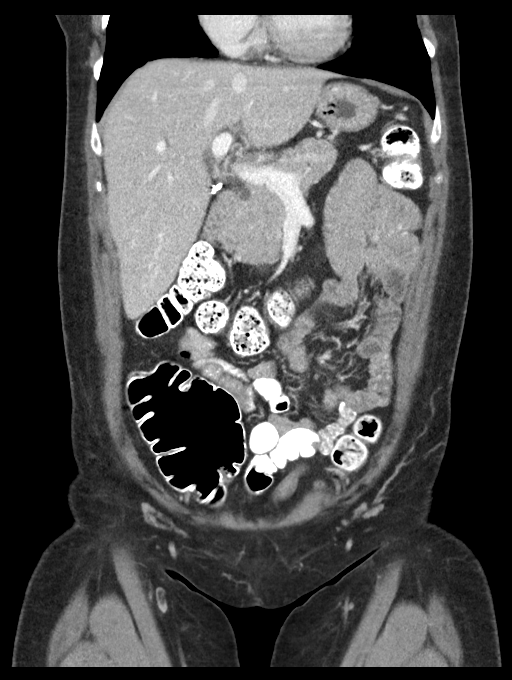
[im 42/94  soft-tissue]
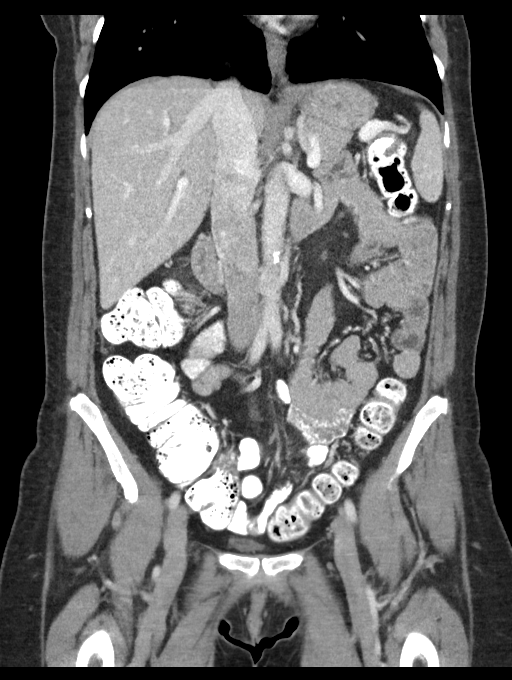
[im 52/94  soft-tissue]
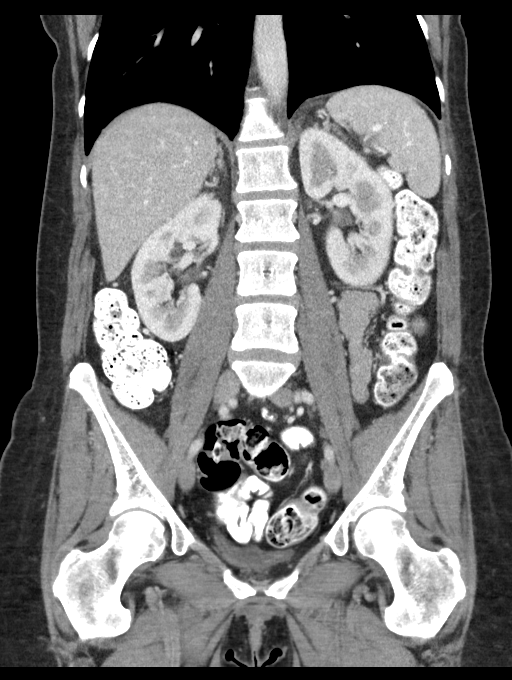

[15 of 46 positions shown; findings below may reference images not displayed]

RADIATION DOSE REDUCTION: This exam was performed according to the
departmental dose-optimization program which includes automated
exposure control, adjustment of the mA and/or kV according to
patient size and/or use of iterative reconstruction technique.

CONTRAST:  100mL OMNIPAQUE IOHEXOL 300 MG/ML  SOLN
FINDINGS: Lower chest: No acute abnormality.

Hepatobiliary: No focal liver abnormality is seen. Status post
cholecystectomy. No biliary dilatation.

Pancreas: Unremarkable. No pancreatic ductal dilatation or
surrounding inflammatory changes.

Spleen: Normal in size without focal abnormality.

Adrenals/Urinary Tract: Adrenal glands are unremarkable. Kidneys are
normal, without renal calculi, focal lesion, or hydronephrosis.
Bladder is unremarkable.

Stomach/Bowel: Stomach is within normal limits. Appendix appears
normal. No evidence of bowel wall thickening, distention, or
inflammatory changes. There is a large amount of stool throughout
the colon.

Vascular/Lymphatic: No significant vascular findings are present. No
enlarged abdominal or pelvic lymph nodes.

Reproductive: Status post hysterectomy.  Adnexa unremarkable.

Other: There is no ascites or free air. There is a small fat
containing umbilical hernia. There is a single small new fat
containing umbilical hernia midline superior to the level of the
umbilicus.

Musculoskeletal: No acute or significant osseous findings.
IMPRESSION: 1. New small ventral hernia superior to the umbilicus.
2. No other acute localizing process in the abdomen or pelvis.

## 2022-06-03 ENCOUNTER — Encounter: Payer: Self-pay | Admitting: Nurse Practitioner

## 2022-06-03 ENCOUNTER — Ambulatory Visit (INDEPENDENT_AMBULATORY_CARE_PROVIDER_SITE_OTHER): Payer: BC Managed Care – PPO | Admitting: Nurse Practitioner

## 2022-06-03 ENCOUNTER — Telehealth: Payer: Self-pay

## 2022-06-03 VITALS — BP 137/72 | HR 88 | Temp 97.4°F | Resp 16 | Ht 63.0 in | Wt 151.2 lb

## 2022-06-03 DIAGNOSIS — E538 Deficiency of other specified B group vitamins: Secondary | ICD-10-CM

## 2022-06-03 DIAGNOSIS — R635 Abnormal weight gain: Secondary | ICD-10-CM

## 2022-06-03 DIAGNOSIS — E782 Mixed hyperlipidemia: Secondary | ICD-10-CM | POA: Diagnosis not present

## 2022-06-03 MED ORDER — PHENTERMINE HCL 37.5 MG PO TABS: 37.5000 mg | ORAL_TABLET | Freq: Every day | ORAL | 0 refills | Status: DC

## 2022-06-03 NOTE — Telephone Encounter (Signed)
Faxed ICD 10 code  with written dx to [facility]

## 2022-06-03 NOTE — Progress Notes (Signed)
[facility] [place], [place] [address]  Internal MEDICINE  Office Visit Note  Patient Name: Lisa Osborn  [id number]  [id number]  Date of Service: 06/03/2022  Chief Complaint  Patient presents with   Follow-up    Weight loss    Hyperlipidemia   Hypertension   Depression    HPI Lisa Osborn presents for a follow-up visit for lab results and weight gain High cholesterol, but low risk for CVD Borderline low B12, ferritin  Weight loss --gained over holidays with just diet and lifestyle modifications as previously discussed.     Current Medication: Outpatient Encounter Medications as of 06/03/2022  Medication Sig Note   amLODipine (NORVASC) 10 MG tablet Take 1 tablet (10 mg total) by mouth daily.    EPINEPHrine 0.3 mg/0.3 mL IJ SOAJ injection Inject 0.3 mg into the muscle as needed for anaphylaxis. 03/23/2021: Prn    estradiol (CLIMARA) 0.05 mg/24hr patch Place 1 patch (0.05 mg total) onto the skin once a week.    hydrochlorothiazide (HYDRODIURIL) 12.5 MG tablet Take 1 tablet (12.5 mg total) by mouth daily.    ibuprofen (ADVIL) 800 MG tablet Take 1 tablet (800 mg total) by mouth every 8 (eight) hours as needed for moderate pain. For AFTER surgery only 03/23/2021: Prn    phentermine (ADIPEX-P) 37.5 MG tablet Take 1 tablet (37.5 mg total) by mouth daily before breakfast. May start with 1/2 tablet daily.    venlafaxine XR (EFFEXOR-XR) 150 MG 24 hr capsule Take 1 capsule (150 mg total) by mouth daily with breakfast.    zolpidem (AMBIEN) 10 MG tablet Take 1 tablet (10 mg total) by mouth at bedtime as needed. for sleep    No facility-administered encounter medications on file as of 06/03/2022.    Surgical History: Past Surgical History:  Procedure Laterality Date   CESAREAN SECTION     CHOLECYSTECTOMY     COLONOSCOPY WITH PROPOFOL N/A 02/16/2021   Procedure: COLONOSCOPY WITH PROPOFOL;  Surgeon: Lin Landsman, MD;  Location: [facility];  Service:  Gastroenterology;  Laterality: N/A;   NOSE SURGERY     Surgery for deviated septum   OTHER SURGICAL HISTORY     Mesh placement for bladder prolapse versus urinary incontinence   ROBOTIC ASSISTED BILATERAL SALPINGO OOPHERECTOMY N/A 03/03/2021   Procedure: XI ROBOTIC ASSISTED BILATERAL SALPINGO OOPHORECTOMY,MINI LAPAROTOMY AND OMENTECTOMY, VAGINAL MASS EXCISION;  Surgeon: Lafonda Mosses, MD;  Location: [facility];  Service: Gynecology;  Laterality: N/A;   VAGINAL HYSTERECTOMY      Medical History: Past Medical History:  Diagnosis Date   Allergy    Anxiety    Depression    Gout 2020   none since then   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   HLD (hyperlipidemia)    Hypertension     Family History: Family History  Problem Relation Age of Onset   Hyperlipidemia Mother    Hyperlipidemia Father    Hypertension Father    Renal cancer Father 7   Prostate cancer Father 5   Melanoma Father        dx. in 49s and a second time in his 69s   Brain cancer Brother 55   Breast cancer Paternal Aunt        dx. 65s   Non-Hodgkin's lymphoma Paternal Aunt        dx. 37s   Thyroid cancer Paternal Aunt        dx. 50s   Melanoma Paternal Aunt  dx. 90s   Pancreatic cancer Paternal Uncle 60   Gastric cancer Paternal Uncle 67   Prostate cancer Paternal Uncle 37   Breast cancer Maternal Grandmother    Lung cancer Paternal Grandmother        smoked, dx. 72s   Lung cancer Paternal Grandfather        smoked, dx. 38s   Breast cancer Cousin        pat cousin, dx. 3s    Social History   Socioeconomic History   Marital status: Married    Spouse name: Not on file   Number of children: Not on file   Years of education: Not on file   Highest education level: Not on file  Occupational History   Not on file  Tobacco Use   Smoking status: Never    Passive exposure: Past   Smokeless tobacco: Never  Vaping Use   Vaping Use: Never used  Substance and Sexual Activity    Alcohol use: Yes    Comment: ocassionally   Drug use: Never   Sexual activity: Not on file  Other Topics Concern   Not on file  Social History Narrative   Not on file   Social Determinants of Health   Financial Resource Strain: Not on file  Food Insecurity: Not on file  Transportation Needs: Not on file  Physical Activity: Not on file  Stress: Not on file  Social Connections: Not on file  Intimate Partner Violence: Not on file      Review of Systems  Constitutional:  Positive for diaphoresis (night sweats). Negative for chills, fatigue and unexpected weight change.  HENT:  Positive for postnasal drip. Negative for congestion, rhinorrhea, sneezing and sore throat.   Eyes:  Negative for redness.  Respiratory:  Negative for cough, chest tightness, shortness of breath and wheezing.   Cardiovascular: Negative.  Negative for chest pain and palpitations.  Gastrointestinal:  Negative for abdominal pain, constipation, diarrhea, nausea and vomiting.  Endocrine: Positive for heat intolerance (hot flashes).  Genitourinary:  Negative for dysuria and frequency.  Musculoskeletal: Negative.  Negative for arthralgias, back pain, joint swelling and neck pain.  Skin:  Negative for rash.  Neurological: Negative.  Negative for tremors and numbness.  Hematological:  Negative for adenopathy. Does not bruise/bleed easily.  Psychiatric/Behavioral:  Positive for behavioral problems (Depression), dysphoric mood and sleep disturbance. Negative for self-injury and suicidal ideas. The patient is nervous/anxious.     Vital Signs: BP 137/72   Pulse 88   Temp (!) 97.4 F (36.3 C)   Resp 16   Ht '5\' 3"'$  (1.6 m)   Wt 151 lb 3.2 oz (68.6 kg)   SpO2 97%   BMI 26.78 kg/m    Physical Exam Vitals reviewed.  Constitutional:      General: She is not in acute distress.    Appearance: Normal appearance. She is well-developed, well-groomed and overweight. She is not ill-appearing.  HENT:     Head:  Normocephalic and atraumatic.  Eyes:     Pupils: Pupils are equal, round, and reactive to light.  Cardiovascular:     Rate and Rhythm: Normal rate and regular rhythm.  Pulmonary:     Effort: Pulmonary effort is normal. No respiratory distress.  Neurological:     Mental Status: She is alert and oriented to person, place, and time.  Psychiatric:        Attention and Perception: Attention normal.        Mood and Affect: Mood is  depressed (sad affect). Affect is tearful.        Speech: Speech normal.        Behavior: Behavior normal. Behavior is cooperative.        Thought Content: Thought content normal.        Judgment: Judgment normal.        Assessment/Plan: 1. Abnormal weight gain Phentermine prescribed, start this medication and continue diet and lifestyle modifications. Follow up in 4 weeks.  - phentermine (ADIPEX-P) 37.5 MG tablet; Take 1 tablet (37.5 mg total) by mouth daily before breakfast. May start with 1/2 tablet daily.  Dispense: 30 tablet; Refill: 0  2. B12 deficiency Can start B12 injections again or take OTC oral supplement.   3. Mixed hyperlipidemia Elevated LDL, discussed diet and lifestyle modifications that will help improve her LDL.    General Counseling: sugar vanzandt understanding of the findings of todays visit and agrees with plan of treatment. I have discussed any further diagnostic evaluation that may be needed or ordered today. We also reviewed her medications today. she has been encouraged to call the office with any questions or concerns that should arise related to todays visit.    No orders of the defined types were placed in this encounter.   Meds ordered this encounter  Medications   phentermine (ADIPEX-P) 37.5 MG tablet    Sig: Take 1 tablet (37.5 mg total) by mouth daily before breakfast. May start with 1/2 tablet daily.    Dispense:  30 tablet    Refill:  0    Return in about 4 weeks (around 07/01/2022) for F/U, Weight loss, Kemara Quigley  PCP.   Total time spent:30 Minutes Time spent includes review of chart, medications, test results, and follow up plan with the patient.    Controlled Substance Database was reviewed by me.  This patient was seen by Jonetta Osgood, FNP-C in collaboration with Dr. Clayborn Bigness as a part of collaborative care agreement.   Saylor Sheckler R. Valetta Fuller, MSN, FNP-C Internal medicine

## 2022-06-15 ENCOUNTER — Ambulatory Visit
Admission: RE | Admit: 2022-06-15 | Discharge: 2022-06-15 | Disposition: A | Discharge status: Home / Self Care | Payer: BC Managed Care – PPO | Source: Ambulatory Visit | Attending: Nurse Practitioner | Admitting: Nurse Practitioner

## 2022-06-15 DIAGNOSIS — Z1231 Encounter for screening mammogram for malignant neoplasm of breast: Secondary | ICD-10-CM | POA: Insufficient documentation

## 2022-06-17 ENCOUNTER — Other Ambulatory Visit: Payer: Self-pay | Admitting: Nurse Practitioner

## 2022-06-17 DIAGNOSIS — R928 Other abnormal and inconclusive findings on diagnostic imaging of breast: Secondary | ICD-10-CM

## 2022-06-17 DIAGNOSIS — R921 Mammographic calcification found on diagnostic imaging of breast: Secondary | ICD-10-CM

## 2022-06-21 ENCOUNTER — Ambulatory Visit
Admission: RE | Admit: 2022-06-21 | Discharge: 2022-06-21 | Disposition: A | Discharge status: Home / Self Care | Payer: BC Managed Care – PPO | Source: Ambulatory Visit | Attending: Nurse Practitioner | Admitting: Nurse Practitioner

## 2022-06-21 DIAGNOSIS — R928 Other abnormal and inconclusive findings on diagnostic imaging of breast: Secondary | ICD-10-CM | POA: Diagnosis not present

## 2022-06-21 DIAGNOSIS — R921 Mammographic calcification found on diagnostic imaging of breast: Secondary | ICD-10-CM

## 2022-06-28 ENCOUNTER — Encounter: Payer: Self-pay | Admitting: Nurse Practitioner

## 2022-06-28 ENCOUNTER — Ambulatory Visit (INDEPENDENT_AMBULATORY_CARE_PROVIDER_SITE_OTHER): Payer: BC Managed Care – PPO | Admitting: Nurse Practitioner

## 2022-06-28 DIAGNOSIS — R635 Abnormal weight gain: Secondary | ICD-10-CM

## 2022-06-28 DIAGNOSIS — I10 Essential (primary) hypertension: Secondary | ICD-10-CM

## 2022-06-28 DIAGNOSIS — F5101 Primary insomnia: Secondary | ICD-10-CM

## 2022-06-28 MED ORDER — PHENTERMINE HCL 37.5 MG PO TABS: 37.5000 mg | ORAL_TABLET | Freq: Every day | ORAL | 0 refills | Status: DC

## 2022-06-28 MED ORDER — ZOLPIDEM TARTRATE 10 MG PO TABS: 10.0000 mg | ORAL_TABLET | Freq: Every evening | ORAL | 2 refills | Status: DC | PRN

## 2022-06-28 MED ORDER — HYDROCHLOROTHIAZIDE 12.5 MG PO TABS: 12.5000 mg | ORAL_TABLET | Freq: Every day | ORAL | 3 refills | Status: DC

## 2022-06-28 NOTE — Progress Notes (Unsigned)
[facility] [place], [place] [address]  Internal MEDICINE  Office Visit Note  Patient Name: Lisa Osborn  [id number]  [id number]  Date of Service: 06/28/2022  Chief Complaint  Patient presents with   Follow-up    Weight loss    HPI Lisa Osborn presents for a follow-up visit for weight loss, med refills.  Weight loss -- lost 3 more lbs since last visit. Feels discouraged due to fluctuating weight up and down. Feels like she has to starve herself to lose any weight even when taking phentermine.  Insomnia -- takes [place] for sleep, needs refills.  Due for refills of HCTZ     Current Medication: Outpatient Encounter Medications as of 06/28/2022  Medication Sig Note   amLODipine (NORVASC) 10 MG tablet Take 1 tablet (10 mg total) by mouth daily.    EPINEPHrine 0.3 mg/0.3 mL IJ SOAJ injection Inject 0.3 mg into the muscle as needed for anaphylaxis. 03/23/2021: Prn    estradiol (CLIMARA) 0.05 mg/24hr patch Place 1 patch (0.05 mg total) onto the skin once a week.    ibuprofen (ADVIL) 800 MG tablet Take 1 tablet (800 mg total) by mouth every 8 (eight) hours as needed for moderate pain. For AFTER surgery only 03/23/2021: Prn    venlafaxine XR (EFFEXOR-XR) 150 MG 24 hr capsule Take 1 capsule (150 mg total) by mouth daily with breakfast.    [DISCONTINUED] hydrochlorothiazide (HYDRODIURIL) 12.5 MG tablet Take 1 tablet (12.5 mg total) by mouth daily.    [DISCONTINUED] phentermine (ADIPEX-P) 37.5 MG tablet Take 1 tablet (37.5 mg total) by mouth daily before breakfast. May start with 1/2 tablet daily.    [DISCONTINUED] zolpidem (AMBIEN) 10 MG tablet Take 1 tablet (10 mg total) by mouth at bedtime as needed. for sleep    hydrochlorothiazide (HYDRODIURIL) 12.5 MG tablet Take 1 tablet (12.5 mg total) by mouth daily.    phentermine (ADIPEX-P) 37.5 MG tablet Take 1 tablet (37.5 mg total) by mouth daily before breakfast.    zolpidem (AMBIEN) 10 MG tablet Take 1 tablet (10 mg total)  by mouth at bedtime as needed for sleep. for sleep    No facility-administered encounter medications on file as of 06/28/2022.    Surgical History: Past Surgical History:  Procedure Laterality Date   CESAREAN SECTION     CHOLECYSTECTOMY     COLONOSCOPY WITH PROPOFOL N/A 02/16/2021   Procedure: COLONOSCOPY WITH PROPOFOL;  Surgeon: Lin Landsman, MD;  Location: [facility] ENDOSCOPY;  Service: Gastroenterology;  Laterality: N/A;   NOSE SURGERY     Surgery for deviated septum   OTHER SURGICAL HISTORY     Mesh placement for bladder prolapse versus urinary incontinence   ROBOTIC ASSISTED BILATERAL SALPINGO OOPHERECTOMY N/A 03/03/2021   Procedure: XI ROBOTIC ASSISTED BILATERAL SALPINGO OOPHORECTOMY,MINI LAPAROTOMY AND OMENTECTOMY, VAGINAL MASS EXCISION;  Surgeon: Lafonda Mosses, MD;  Location: [facility] ORS;  Service: Gynecology;  Laterality: N/A;   VAGINAL HYSTERECTOMY      Medical History: Past Medical History:  Diagnosis Date   Allergy    Anxiety    Depression    Gout 2020   none since then   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 in 22s and a second time in his 46s   Brain cancer Brother 3   Breast cancer Paternal Aunt        dx. 65s   Non-Hodgkin's lymphoma Paternal Aunt        dx. 66s   Thyroid cancer Paternal Aunt        dx. 78s   Melanoma Paternal Aunt        dx. 12s   Pancreatic cancer Paternal Uncle 65   Gastric cancer Paternal Uncle 7   Prostate cancer Paternal Uncle 43   Breast cancer Maternal Grandmother    Lung cancer Paternal Grandmother        smoked, dx. 4s   Lung cancer Paternal Grandfather        smoked, dx. 2s   Breast cancer Cousin        pat cousin, dx. 69s    Social History    Socioeconomic History   Marital status: Married    Spouse name: Not on file   Number of children: Not on file   Years of education: Not on file   Highest education level: Not on file  Occupational History   Not on file  Tobacco Use   Smoking status: Never    Passive exposure: Past   Smokeless tobacco: Never  Vaping Use   Vaping Use: Never used  Substance and Sexual Activity   Alcohol use: Yes    Comment: ocassionally   Drug use: Never   Sexual activity: Not on file  Other Topics Concern   Not on file  Social History Narrative   Not on file   Social Determinants of Health   Financial Resource Strain: Not on file  Food Insecurity: Not on file  Transportation Needs: Not on file  Physical Activity: Not on file  Stress: Not on file  Social Connections: Not on file  Intimate Partner Violence: Not on file      Review of Systems  Constitutional:  Positive for diaphoresis (night sweats). Negative for chills, fatigue and unexpected weight change.  HENT:  Positive for postnasal drip. Negative for congestion, rhinorrhea, sneezing and sore throat.   Eyes:  Negative for redness.  Respiratory:  Negative for cough, chest tightness, shortness of breath and wheezing.   Cardiovascular: Negative.  Negative for chest pain and palpitations.  Gastrointestinal:  Negative for abdominal pain, constipation, diarrhea, nausea and vomiting.  Endocrine: Positive for heat intolerance (hot flashes).  Genitourinary:  Negative for dysuria and frequency.  Musculoskeletal: Negative.  Negative for arthralgias, back pain, joint swelling and neck pain.  Skin:  Negative for rash.  Neurological: Negative.  Negative for tremors and numbness.  Hematological:  Negative for adenopathy. Does not bruise/bleed easily.  Psychiatric/Behavioral:  Positive for behavioral problems (Depression), dysphoric mood and sleep disturbance. Negative for self-injury and suicidal ideas. The patient is nervous/anxious.      Vital Signs: BP 127/83   Pulse 94   Temp (!) 97.5 F (36.4 C)   Resp 16   Ht '5\' 3"'$  (1.6 m)   Wt 148 lb 3.2 oz (67.2 kg)   SpO2 99%   BMI 26.25 kg/m    Physical Exam Vitals reviewed.  Constitutional:      General: She is not in acute distress.    Appearance: Normal appearance. She is well-developed, well-groomed and overweight. She is not ill-appearing.  HENT:     Head: Normocephalic and atraumatic.  Eyes:     Pupils: Pupils are equal, round, and reactive to  light.  Cardiovascular:     Rate and Rhythm: Normal rate and regular rhythm.  Pulmonary:     Effort: Pulmonary effort is normal. No respiratory distress.  Neurological:     Mental Status: She is alert and oriented to person, place, and time.  Psychiatric:        Attention and Perception: Attention normal.        Mood and Affect: Mood and affect normal.        Speech: Speech normal.        Behavior: Behavior normal. Behavior is cooperative.        Thought Content: Thought content normal.        Judgment: Judgment normal.        Assessment/Plan: 1. Abnormal weight gain Continue phentermine for now, also schedule consult with Dr. Clayborn Bigness for further guidance and recommendation for weight loss management. - phentermine (ADIPEX-P) 37.5 MG tablet; Take 1 tablet (37.5 mg total) by mouth daily before breakfast.  Dispense: 30 tablet; Refill: 0  2. Essential hypertension BP stable, continue HCTZ as prescribed.  - hydrochlorothiazide (HYDRODIURIL) 12.5 MG tablet; Take 1 tablet (12.5 mg total) by mouth daily.  Dispense: 90 tablet; Refill: 3  3. Primary insomnia Continue ambien as prescribed.  - zolpidem (AMBIEN) 10 MG tablet; Take 1 tablet (10 mg total) by mouth at bedtime as needed for sleep. for sleep  Dispense: 30 tablet; Refill: 2   General Counseling: Aaniya verbalizes understanding of the findings of todays visit and agrees with plan of treatment. I have discussed any further diagnostic evaluation that may  be needed or ordered today. We also reviewed her medications today. she has been encouraged to call the office with any questions or concerns that should arise related to todays visit.    No orders of the defined types were placed in this encounter.   Meds ordered this encounter  Medications   phentermine (ADIPEX-P) 37.5 MG tablet    Sig: Take 1 tablet (37.5 mg total) by mouth daily before breakfast.    Dispense:  30 tablet    Refill:  0   hydrochlorothiazide (HYDRODIURIL) 12.5 MG tablet    Sig: Take 1 tablet (12.5 mg total) by mouth daily.    Dispense:  90 tablet    Refill:  3    For future refills   zolpidem (AMBIEN) 10 MG tablet    Sig: Take 1 tablet (10 mg total) by mouth at bedtime as needed for sleep. for sleep    Dispense:  30 tablet    Refill:  2    For future refills    Return for F/U with [facility] for weight loss management at patient preference. .   Total time spent:30 Minutes Time spent includes review of chart, medications, test results, and follow up plan with the patient.   [place] Controlled Substance Database was reviewed by me.  This patient was seen by Jonetta Osgood, FNP-C in collaboration with Dr. Clayborn Bigness as a part of collaborative care agreement.   Terren Jandreau R. Valetta Fuller, MSN, FNP-C Internal medicine

## 2022-06-29 ENCOUNTER — Encounter: Payer: Self-pay | Admitting: Nurse Practitioner

## 2022-07-06 ENCOUNTER — Ambulatory Visit: Payer: BC Managed Care – PPO | Admitting: Internal Medicine

## 2022-07-10 ENCOUNTER — Other Ambulatory Visit: Payer: Self-pay | Admitting: Gynecologic Oncology

## 2022-07-10 DIAGNOSIS — R5383 Other fatigue: Secondary | ICD-10-CM

## 2022-07-10 DIAGNOSIS — R232 Flushing: Secondary | ICD-10-CM

## 2022-07-27 ENCOUNTER — Encounter: Payer: Self-pay | Admitting: Nurse Practitioner

## 2022-07-27 ENCOUNTER — Ambulatory Visit (INDEPENDENT_AMBULATORY_CARE_PROVIDER_SITE_OTHER): Payer: BC Managed Care – PPO | Admitting: Nurse Practitioner

## 2022-07-27 VITALS — BP 129/75 | HR 88 | Temp 97.9°F | Resp 16 | Ht 63.0 in | Wt 146.6 lb

## 2022-07-27 DIAGNOSIS — R635 Abnormal weight gain: Secondary | ICD-10-CM

## 2022-07-27 DIAGNOSIS — Z6825 Body mass index (BMI) 25.0-25.9, adult: Secondary | ICD-10-CM

## 2022-07-27 MED ORDER — PHENTERMINE HCL 37.5 MG PO TABS: 37.5000 mg | ORAL_TABLET | Freq: Every day | ORAL | 0 refills | Status: DC

## 2022-07-27 NOTE — Progress Notes (Signed)
[facility] [place], [place] [address]  Internal MEDICINE  Office Visit Note  Patient Name: BILEN GOLE  [id number]  CM:[id number]  Date of Service: 07/27/2022  Chief Complaint  Patient presents with   Hypertension   Hyperlipidemia   Depression   Follow-up    HPI Clary presents for a follow-up visit for weight loss management --lost 2 lbs on phentermine.  --working on diet and physical activity.  --wants to continue phentermine.  --did not have appt with Dr. Stephanie Coup. She had canceled the appt because she was sick at the time and did not reschedule because she is not sure what additional information she would glean from the visit.  --she is frustrated because she knows that her postmenopausal state effects her ability to lose weight. She is on HRT managed by her gynecologic oncologist. She has a strong sweet tooth and knows her weakness. She does not want to starve herself to lose weight or exhaust herself working out.       Current Medication: Outpatient Encounter Medications as of 07/27/2022  Medication Sig Note   amLODipine (NORVASC) 10 MG tablet Take 1 tablet (10 mg total) by mouth daily.    EPINEPHrine 0.3 mg/0.3 mL IJ SOAJ injection Inject 0.3 mg into the muscle as needed for anaphylaxis. 03/23/2021: Prn    estradiol (CLIMARA - DOSED IN MG/24 HR) 0.05 mg/24hr patch PLACE 1 PATCH (0.05 MG TOTAL) ONTO THE SKIN ONCE A WEEK.    hydrochlorothiazide (HYDRODIURIL) 12.5 MG tablet Take 1 tablet (12.5 mg total) by mouth daily.    ibuprofen (ADVIL) 800 MG tablet Take 1 tablet (800 mg total) by mouth every 8 (eight) hours as needed for moderate pain. For AFTER surgery only 03/23/2021: Prn    [START ON 08/24/2022] phentermine (ADIPEX-P) 37.5 MG tablet Take 1 tablet (37.5 mg total) by mouth daily before breakfast.    venlafaxine XR (EFFEXOR-XR) 150 MG 24 hr capsule Take 1 capsule (150 mg total) by mouth daily with breakfast.    zolpidem (AMBIEN) 10 MG tablet Take 1  tablet (10 mg total) by mouth at bedtime as needed for sleep. for sleep    [DISCONTINUED] phentermine (ADIPEX-P) 37.5 MG tablet Take 1 tablet (37.5 mg total) by mouth daily before breakfast.    phentermine (ADIPEX-P) 37.5 MG tablet Take 1 tablet (37.5 mg total) by mouth daily before breakfast.    No facility-administered encounter medications on file as of 07/27/2022.    Surgical History: Past Surgical History:  Procedure Laterality Date   CESAREAN SECTION     CHOLECYSTECTOMY     COLONOSCOPY WITH PROPOFOL N/A 02/16/2021   Procedure: COLONOSCOPY WITH PROPOFOL;  Surgeon: Lin Landsman, MD;  [facility] ENDOSCOPY;  Service: Gastroenterology;  Laterality: N/A;   NOSE SURGERY     Surgery for deviated septum   OTHER SURGICAL HISTORY     Mesh placement for bladder prolapse versus urinary incontinence   ROBOTIC ASSISTED BILATERAL SALPINGO OOPHERECTOMY N/A 03/03/2021   Procedure: XI ROBOTIC ASSISTED BILATERAL SALPINGO OOPHORECTOMY,MINI LAPAROTOMY AND OMENTECTOMY, VAGINAL MASS EXCISION;  Surgeon: Lafonda Mosses, MD;  Location: [facility] ORS;  Service: Gynecology;  Laterality: N/A;   VAGINAL HYSTERECTOMY      Medical History: Past Medical History:  Diagnosis Date   Allergy    Anxiety    Depression    Gout 2020   none since then   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 in 21s and a second time in his 20s   Brain cancer Brother 34   Breast cancer Paternal Aunt        dx. 39s   Non-Hodgkin's lymphoma Paternal Aunt        dx. 68s   Thyroid cancer Paternal Aunt        dx. 23s   Melanoma Paternal Aunt        dx. 32s   Pancreatic cancer Paternal Uncle 41   Gastric cancer Paternal Uncle 53   Prostate cancer  Paternal Uncle 23   Breast cancer Maternal Grandmother    Lung cancer Paternal Grandmother        smoked, dx. 85s   Lung cancer Paternal Grandfather        smoked, dx. 60s   Breast cancer Cousin        pat cousin, dx. 82s    Social History   Socioeconomic History   Marital status: Married    Spouse name: Not on file   Number of children: Not on file   Years of education: Not on file   Highest education level: Not on file  Occupational History   Not on file  Tobacco Use   Smoking status: Never    Passive exposure: Past   Smokeless tobacco: Never  Vaping Use   Vaping Use: Never used  Substance and Sexual Activity   Alcohol use: Yes    Comment: ocassionally   Drug use: Never   Sexual activity: Not on file  Other Topics Concern   Not on file  Social History Narrative   Not on file   Social Determinants of Health   Financial Resource Strain: Not on file  Food Insecurity: Not on file  Transportation Needs: Not on file  Physical Activity: Not on file  Stress: Not on file  Social Connections: Not on file  Intimate Partner Violence: Not on file      Review of Systems  Constitutional:  Positive for diaphoresis (night sweats). Negative for chills, fatigue and unexpected weight change.  HENT:  Positive for postnasal drip. Negative for congestion, rhinorrhea, sneezing and sore throat.   Eyes:  Negative for redness.  Respiratory:  Negative for cough, chest tightness, shortness of breath and wheezing.   Cardiovascular: Negative.  Negative for chest pain and palpitations.  Gastrointestinal:  Negative for abdominal pain, constipation, diarrhea, nausea and vomiting.  Endocrine: Positive for heat intolerance (hot flashes).  Genitourinary:  Negative for dysuria and frequency.  Musculoskeletal: Negative.  Negative for arthralgias, back pain, joint swelling and neck pain.  Skin:  Negative for rash.  Neurological: Negative.  Negative for tremors and numbness.  Hematological:   Negative for adenopathy. Does not bruise/bleed easily.  Psychiatric/Behavioral:  Positive for behavioral problems (Depression), dysphoric mood and sleep disturbance. Negative for self-injury and suicidal ideas. The patient is nervous/anxious.     Vital Signs: BP 129/75   Pulse 88   Temp 97.9 F (36.6 C)   Resp 16   Ht '5\' 3"'$  (1.6 m)   Wt 146 lb 9.6 oz (66.5 kg)   SpO2 98%   BMI 25.97 kg/m    Physical Exam Vitals reviewed.  Constitutional:  General: She is not in acute distress.    Appearance: Normal appearance. She is well-developed, well-groomed and overweight. She is not ill-appearing.  HENT:     Head: Normocephalic and atraumatic.  Eyes:     Pupils: Pupils are equal, round, and reactive to light.  Cardiovascular:     Rate and Rhythm: Normal rate and regular rhythm.  Pulmonary:     Effort: Pulmonary effort is normal. No respiratory distress.  Neurological:     Mental Status: She is alert and oriented to person, place, and time.  Psychiatric:        Attention and Perception: Attention normal.        Mood and Affect: Mood and affect normal.        Speech: Speech normal.        Behavior: Behavior normal. Behavior is cooperative.        Thought Content: Thought content normal.        Judgment: Judgment normal.        Assessment/Plan: 1. Abnormal weight gain Continue phentermine as prescribed x2 months, follow up in 8 weeks for evaluate progress.  - phentermine (ADIPEX-P) 37.5 MG tablet; Take 1 tablet (37.5 mg total) by mouth daily before breakfast.  Dispense: 30 tablet; Refill: 0 - phentermine (ADIPEX-P) 37.5 MG tablet; Take 1 tablet (37.5 mg total) by mouth daily before breakfast.  Dispense: 30 tablet; Refill: 0  2. BMI 25.0-25.9,adult Lost 2 lbs, continue phentermine as prescribed, follow up in 8 weeks.  - phentermine (ADIPEX-P) 37.5 MG tablet; Take 1 tablet (37.5 mg total) by mouth daily before breakfast.  Dispense: 30 tablet; Refill: 0 - phentermine  (ADIPEX-P) 37.5 MG tablet; Take 1 tablet (37.5 mg total) by mouth daily before breakfast.  Dispense: 30 tablet; Refill: 0   General Counseling: Latavia verbalizes understanding of the findings of todays visit and agrees with plan of treatment. I have discussed any further diagnostic evaluation that may be needed or ordered today. We also reviewed her medications today. she has been encouraged to call the office with any questions or concerns that should arise related to todays visit.    No orders of the defined types were placed in this encounter.   Meds ordered this encounter  Medications   phentermine (ADIPEX-P) 37.5 MG tablet    Sig: Take 1 tablet (37.5 mg total) by mouth daily before breakfast.    Dispense:  30 tablet    Refill:  0   phentermine (ADIPEX-P) 37.5 MG tablet    Sig: Take 1 tablet (37.5 mg total) by mouth daily before breakfast.    Dispense:  30 tablet    Refill:  0    April refill    Return in about 8 weeks (around 09/21/2022) for F/U, Weight loss, Zeriyah Wain PCP.   Total time spent:20 Minutes Time spent includes review of chart, medications, test results, and follow up plan with the patient.   [place] Controlled Substance Database was reviewed by me.  This patient was seen by Jonetta Osgood, FNP-C in collaboration with Dr. Clayborn Bigness as a part of collaborative care agreement.   Steve Youngberg R. Valetta Fuller, MSN, FNP-C Internal medicine

## 2022-08-20 ENCOUNTER — Other Ambulatory Visit: Payer: Self-pay | Admitting: Gynecologic Oncology

## 2022-08-20 DIAGNOSIS — D3912 Neoplasm of uncertain behavior of left ovary: Secondary | ICD-10-CM

## 2022-09-21 ENCOUNTER — Ambulatory Visit: Payer: BC Managed Care – PPO | Admitting: Nurse Practitioner

## 2022-10-05 ENCOUNTER — Telehealth: Payer: BC Managed Care – PPO | Admitting: Nurse Practitioner

## 2022-10-05 ENCOUNTER — Encounter: Payer: Self-pay | Admitting: Nurse Practitioner

## 2022-10-05 VITALS — Ht 63.0 in | Wt 140.0 lb

## 2022-10-05 DIAGNOSIS — F5101 Primary insomnia: Secondary | ICD-10-CM

## 2022-10-05 DIAGNOSIS — J0110 Acute frontal sinusitis, unspecified: Secondary | ICD-10-CM

## 2022-10-05 DIAGNOSIS — R635 Abnormal weight gain: Secondary | ICD-10-CM

## 2022-10-05 DIAGNOSIS — J020 Streptococcal pharyngitis: Secondary | ICD-10-CM

## 2022-10-05 MED ORDER — ZOLPIDEM TARTRATE 10 MG PO TABS: 10.0000 mg | ORAL_TABLET | Freq: Every evening | ORAL | 2 refills | Status: DC | PRN

## 2022-10-05 MED ORDER — AMOXICILLIN-POT CLAVULANATE 875-125 MG PO TABS: 1.0000 | ORAL_TABLET | Freq: Two times a day (BID) | ORAL | 0 refills | Status: AC

## 2022-10-05 MED ORDER — PHENTERMINE HCL 37.5 MG PO TABS: 37.5000 mg | ORAL_TABLET | Freq: Every day | ORAL | 0 refills | Status: DC

## 2022-10-05 NOTE — Progress Notes (Signed)
[facility] [address] [place], [place] [address]  Internal MEDICINE  Telephone Visit  Patient Name: Lisa Osborn  [id number]  [id number]  Date of Service: 10/05/2022  I connected with the patient at 1015 by video call and verified the patients identity using two identifiers.   I discussed the limitations, risks, security and privacy concerns of performing an evaluation and management service by telephone and the availability of in person appointments. I also discussed with the patient that there may be a patient responsible charge related to the service.  The patient expressed understanding and agrees to proceed.    Chief Complaint  Patient presents with   Telephone Assessment    [phone]   Telephone Screen    Covid test is negative    Sore Throat    Exposure to strep test    Sinusitis   Headache    HPI Lisa Osborn presents for a telehealth virtual visit for upper respiratory infection.  Recently exposed to strep via grandchildren.  --negative for covid  --now reports sore and swollen throat, severe headache, body ache. Started with cold symptoms  Symptoms have progressed over the past several days.   Current Medication: Outpatient Encounter Medications as of 10/05/2022  Medication Sig Note   amLODipine (NORVASC) 10 MG tablet Take 1 tablet (10 mg total) by mouth daily.    amoxicillin-clavulanate (AUGMENTIN) 875-125 MG tablet Take 1 tablet by mouth 2 (two) times daily for 10 days. Take with food    EPINEPHrine 0.3 mg/0.3 mL IJ SOAJ injection Inject 0.3 mg into the muscle as needed for anaphylaxis. 03/23/2021: Prn    estradiol (CLIMARA - DOSED IN MG/24 HR) 0.05 mg/24hr patch PLACE 1 PATCH (0.05 MG TOTAL) ONTO THE SKIN ONCE A WEEK.    hydrochlorothiazide (HYDRODIURIL) 12.5 MG tablet Take 1 tablet (12.5 mg total) by mouth daily.    ibuprofen (ADVIL) 800 MG tablet Take 1 tablet (800 mg total) by mouth every 8 (eight) hours as needed for moderate pain. For AFTER  surgery only 03/23/2021: Prn    phentermine (ADIPEX-P) 37.5 MG tablet Take 1 tablet (37.5 mg total) by mouth daily before breakfast.    venlafaxine XR (EFFEXOR-XR) 150 MG 24 hr capsule Take 1 capsule (150 mg total) by mouth daily with breakfast.    [DISCONTINUED] phentermine (ADIPEX-P) 37.5 MG tablet Take 1 tablet (37.5 mg total) by mouth daily before breakfast.    [DISCONTINUED] zolpidem (AMBIEN) 10 MG tablet Take 1 tablet (10 mg total) by mouth at bedtime as needed for sleep. for sleep    phentermine (ADIPEX-P) 37.5 MG tablet Take 1 tablet (37.5 mg total) by mouth daily before breakfast.    zolpidem (AMBIEN) 10 MG tablet Take 1 tablet (10 mg total) by mouth at bedtime as needed for sleep. for sleep    No facility-administered encounter medications on file as of 10/05/2022.    Surgical History: Past Surgical History:  Procedure Laterality Date   CESAREAN SECTION     CHOLECYSTECTOMY     COLONOSCOPY WITH PROPOFOL N/A 02/16/2021   Procedure: COLONOSCOPY WITH PROPOFOL;  Surgeon: Toney Reil, MD;  Location: [facility] ENDOSCOPY;  Service: Gastroenterology;  Laterality: N/A;   NOSE SURGERY     Surgery for deviated septum   OTHER SURGICAL HISTORY     Mesh placement for bladder prolapse versus urinary incontinence   ROBOTIC ASSISTED BILATERAL SALPINGO OOPHERECTOMY N/A 03/03/2021   Procedure: XI ROBOTIC ASSISTED BILATERAL SALPINGO OOPHORECTOMY,MINI LAPAROTOMY AND OMENTECTOMY, VAGINAL MASS EXCISION;  Surgeon: Eugene Garnet  R, MD;  Location: [facility] ORS;  Service: Gynecology;  Laterality: N/A;   VAGINAL HYSTERECTOMY      Medical History: Past Medical History:  Diagnosis Date   Allergy    Anxiety    Depression    Gout 2020   none since then   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 in 14s and a second time in his 53s   Brain cancer Brother 2   Breast cancer Paternal Aunt        dx. 60s   Non-Hodgkin's lymphoma Paternal Aunt        dx. 60s   Thyroid cancer Paternal Aunt        dx. 50s   Melanoma Paternal Aunt        dx. 4s   Pancreatic cancer Paternal Uncle 40   Gastric cancer Paternal Uncle 81   Prostate cancer Paternal Uncle 68   Breast cancer Maternal Grandmother    Lung cancer Paternal Grandmother        smoked, dx. 22s   Lung cancer Paternal Grandfather        smoked, dx. 76s   Breast cancer Cousin        pat cousin, dx. 35s    Social History   Socioeconomic History   Marital status: Married    Spouse name: Not on file   Number of children: Not on file   Years of education: Not on file   Highest education level: Not on file  Occupational History   Not on file  Tobacco Use   Smoking status: Never    Passive exposure: Past   Smokeless tobacco: Never  Vaping Use   Vaping Use: Never used  Substance and Sexual Activity   Alcohol use: Yes    Comment: ocassionally   Drug use: Never   Sexual activity: Not on file  Other Topics Concern   Not on file  Social History Narrative   Not on file   Social Determinants of Health   Financial Resource Strain: Not on file  Food Insecurity: Not on file  Transportation Needs: Not on file  Physical Activity: Not on file  Stress: Not on file  Social Connections: Not on file  Intimate Partner Violence: Not on file      Review of Systems  Constitutional:  Positive for fatigue. Negative for chills and fever.  HENT:  Positive for congestion, postnasal drip, sinus pressure, sore throat and trouble swallowing. Negative for rhinorrhea, sinus pain and sneezing.   Respiratory:  Negative for cough, chest tightness, shortness of breath and wheezing.   Cardiovascular: Negative.  Negative for chest pain and palpitations.  Gastrointestinal:   Negative for diarrhea, nausea and vomiting.  Musculoskeletal:  Negative for myalgias.  Neurological:  Positive for headaches.    Vital Signs: Ht 5\' 3"  (1.6 m)   Wt 140 lb (63.5 kg)   BMI 24.80 kg/m    Observation/Objective: She is alert and oriented and engages in conversation appropriately. No acute distress noted.    Assessment/Plan: 1. Acute non-recurrent frontal sinusitis Take antibiotic as prescribed until gone  - amoxicillin-clavulanate (AUGMENTIN) 875-125 MG tablet; Take 1  tablet by mouth 2 (two) times daily for 10 days. Take with food  Dispense: 20 tablet; Refill: 0  2. Strep pharyngitis Take antibiotic as prescribed until gone - amoxicillin-clavulanate (AUGMENTIN) 875-125 MG tablet; Take 1 tablet by mouth 2 (two) times daily for 10 days. Take with food  Dispense: 20 tablet; Refill: 0  3. Primary insomnia 3 months of refills ordered, follow up in 3 months for additional refills. Continue ambien as prescribed.  - zolpidem (AMBIEN) 10 MG tablet; Take 1 tablet (10 mg total) by mouth at bedtime as needed for sleep. for sleep  Dispense: 30 tablet; Refill: 2  4. Abnormal weight gain 1 month refill of phentermine, next follow up to be push out 1 month - phentermine (ADIPEX-P) 37.5 MG tablet; Take 1 tablet (37.5 mg total) by mouth daily before breakfast.  Dispense: 30 tablet; Refill: 0   General Counseling: Lisa Osborn understanding of the findings of today's phone visit and agrees with plan of treatment. I have discussed any further diagnostic evaluation that may be needed or ordered today. We also reviewed her medications today. she has been encouraged to call the office with any questions or concerns that should arise related to todays visit.  Return if symptoms worsen or fail to improve.   No orders of the defined types were placed in this encounter.   Meds ordered this encounter  Medications   amoxicillin-clavulanate (AUGMENTIN) 875-125 MG tablet    Sig: Take 1  tablet by mouth 2 (two) times daily for 10 days. Take with food    Dispense:  20 tablet    Refill:  0   zolpidem (AMBIEN) 10 MG tablet    Sig: Take 1 tablet (10 mg total) by mouth at bedtime as needed for sleep. for sleep    Dispense:  30 tablet    Refill:  2    For future refills   phentermine (ADIPEX-P) 37.5 MG tablet    Sig: Take 1 tablet (37.5 mg total) by mouth daily before breakfast.    Dispense:  30 tablet    Refill:  0    For future refill    Time spent:30 Minutes Time spent with patient included reviewing progress notes, labs, imaging studies, and discussing plan for follow up.  [place] Controlled Substance Database was reviewed by me for overdose risk score (ORS) if appropriate.  This patient was seen by Sallyanne Kuster, FNP-C in collaboration with Dr. Beverely Risen as a part of collaborative care agreement.  Sayward Horvath R. Tedd Sias, MSN, FNP-C Internal medicine

## 2022-11-09 ENCOUNTER — Ambulatory Visit (INDEPENDENT_AMBULATORY_CARE_PROVIDER_SITE_OTHER): Payer: BC Managed Care – PPO | Admitting: Nurse Practitioner

## 2022-11-09 ENCOUNTER — Encounter: Payer: Self-pay | Admitting: Nurse Practitioner

## 2022-11-09 VITALS — BP 135/82 | HR 90 | Temp 97.0°F | Resp 16 | Ht 63.0 in | Wt 143.4 lb

## 2022-11-09 DIAGNOSIS — F5101 Primary insomnia: Secondary | ICD-10-CM | POA: Diagnosis not present

## 2022-11-09 DIAGNOSIS — N951 Menopausal and female climacteric states: Secondary | ICD-10-CM | POA: Diagnosis not present

## 2022-11-09 DIAGNOSIS — R635 Abnormal weight gain: Secondary | ICD-10-CM

## 2022-11-09 DIAGNOSIS — I10 Essential (primary) hypertension: Secondary | ICD-10-CM | POA: Diagnosis not present

## 2022-11-09 MED ORDER — PHENTERMINE HCL 37.5 MG PO TABS: 37.5000 mg | ORAL_TABLET | Freq: Every day | ORAL | 1 refills | Status: DC

## 2022-11-09 MED ORDER — AMLODIPINE BESYLATE 10 MG PO TABS: 10.0000 mg | ORAL_TABLET | Freq: Every day | ORAL | 3 refills | Status: DC

## 2022-11-09 MED ORDER — ZOLPIDEM TARTRATE 10 MG PO TABS: 10.0000 mg | ORAL_TABLET | Freq: Every evening | ORAL | 0 refills | Status: DC | PRN

## 2022-11-09 MED ORDER — VENLAFAXINE HCL ER 150 MG PO CP24: 150.0000 mg | ORAL_CAPSULE | Freq: Every day | ORAL | 3 refills | Status: DC

## 2022-11-09 NOTE — Progress Notes (Signed)
[facility] [address] [place], [place] [address]  Internal MEDICINE  Office Visit Note  Patient Name: Lisa Osborn  [id number]  [id number]  Date of Service: 11/09/2022  Chief Complaint  Patient presents with   Weight Loss   Hypertension   Insomnia   Menopause    HPI Lisa Osborn presents for a follow-up visit for hypertension, insomnia and weight loss. Weight loss -- staying around 140-143 lbs, phentermine helps control her appetite.  Hypertension -- controlled with current medications   Insomnia -- takes [place] and continues to work well.  Vasomotor symptoms-- still taking venlafaxine which is controlling her symptoms    Current Medication: Outpatient Encounter Medications as of 11/09/2022  Medication Sig Note   amLODipine (NORVASC) 10 MG tablet Take 1 tablet (10 mg total) by mouth daily.    EPINEPHrine 0.3 mg/0.3 mL IJ SOAJ injection Inject 0.3 mg into the muscle as needed for anaphylaxis. 03/23/2021: Prn    estradiol (CLIMARA - DOSED IN MG/24 HR) 0.05 mg/24hr patch PLACE 1 PATCH (0.05 MG TOTAL) ONTO THE SKIN ONCE A WEEK.    hydrochlorothiazide (HYDRODIURIL) 12.5 MG tablet Take 1 tablet (12.5 mg total) by mouth daily.    ibuprofen (ADVIL) 800 MG tablet Take 1 tablet (800 mg total) by mouth every 8 (eight) hours as needed for moderate pain. For AFTER surgery only 03/23/2021: Prn    phentermine (ADIPEX-P) 37.5 MG tablet Take 1 tablet (37.5 mg total) by mouth daily before breakfast.    venlafaxine XR (EFFEXOR-XR) 150 MG 24 hr capsule Take 1 capsule (150 mg total) by mouth daily with breakfast.    zolpidem (AMBIEN) 10 MG tablet Take 1 tablet (10 mg total) by mouth at bedtime as needed for sleep. for sleep    [DISCONTINUED] amLODipine (NORVASC) 10 MG tablet Take 1 tablet (10 mg total) by mouth daily.    [DISCONTINUED] phentermine (ADIPEX-P) 37.5 MG tablet Take 1 tablet (37.5 mg total) by mouth daily before breakfast.    [DISCONTINUED] phentermine (ADIPEX-P) 37.5 MG  tablet Take 1 tablet (37.5 mg total) by mouth daily before breakfast.    [DISCONTINUED] venlafaxine XR (EFFEXOR-XR) 150 MG 24 hr capsule Take 1 capsule (150 mg total) by mouth daily with breakfast.    [DISCONTINUED] zolpidem (AMBIEN) 10 MG tablet Take 1 tablet (10 mg total) by mouth at bedtime as needed for sleep. for sleep    No facility-administered encounter medications on file as of 11/09/2022.    Surgical History: Past Surgical History:  Procedure Laterality Date   CESAREAN SECTION     CHOLECYSTECTOMY     COLONOSCOPY WITH PROPOFOL N/A 02/16/2021   Procedure: COLONOSCOPY WITH PROPOFOL;  Surgeon: Toney Reil, MD;  Location: [facility] ENDOSCOPY;  Service: Gastroenterology;  Laterality: N/A;   NOSE SURGERY     Surgery for deviated septum   OTHER SURGICAL HISTORY     Mesh placement for bladder prolapse versus urinary incontinence   ROBOTIC ASSISTED BILATERAL SALPINGO OOPHERECTOMY N/A 03/03/2021   Procedure: XI ROBOTIC ASSISTED BILATERAL SALPINGO OOPHORECTOMY,MINI LAPAROTOMY AND OMENTECTOMY, VAGINAL MASS EXCISION;  Surgeon: Carver Fila, MD;  Location: [facility] ORS;  Service: Gynecology;  Laterality: N/A;   VAGINAL HYSTERECTOMY      Medical History: Past Medical History:  Diagnosis Date   Allergy    Anxiety    Depression    Gout 2020   none since then   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 in 63s and a second time in his 35s   Brain cancer Brother 39   Breast cancer Paternal Aunt        dx. 60s   Non-Hodgkin's lymphoma Paternal Aunt        dx. 60s   Thyroid cancer Paternal Aunt        dx. 50s   Melanoma Paternal Aunt        dx. 77s   Pancreatic cancer Paternal Uncle 21   Gastric cancer Paternal  Uncle 72   Prostate cancer Paternal Uncle 74   Breast cancer Maternal Grandmother    Lung cancer Paternal Grandmother        smoked, dx. 40s   Lung cancer Paternal Grandfather        smoked, dx. 3s   Breast cancer Cousin        pat cousin, dx. 44s    Social History   Socioeconomic History   Marital status: Married    Spouse name: Not on file   Number of children: Not on file   Years of education: Not on file   Highest education level: Not on file  Occupational History   Not on file  Tobacco Use   Smoking status: Never    Passive exposure: Past   Smokeless tobacco: Never  Vaping Use   Vaping Use: Never used  Substance and Sexual Activity   Alcohol use: Yes    Comment: ocassionally   Drug use: Never   Sexual activity: Not on file  Other Topics Concern   Not on file  Social History Narrative   Not on file   Social Determinants of Health   Financial Resource Strain: Not on file  Food Insecurity: Not on file  Transportation Needs: Not on file  Physical Activity: Not on file  Stress: Not on file  Social Connections: Not on file  Intimate Partner Violence: Not on file      Review of Systems  Constitutional:  Positive for diaphoresis (night sweats). Negative for chills, fatigue and unexpected weight change.  HENT:  Positive for postnasal drip. Negative for congestion, rhinorrhea, sneezing and sore throat.   Eyes:  Negative for redness.  Respiratory:  Negative for cough, chest tightness, shortness of breath and wheezing.   Cardiovascular: Negative.  Negative for chest pain and palpitations.  Gastrointestinal:  Negative for abdominal pain, constipation, diarrhea, nausea and vomiting.  Endocrine: Positive for heat intolerance (hot flashes).  Genitourinary:  Negative for dysuria and frequency.  Musculoskeletal: Negative.  Negative for arthralgias, back pain, joint swelling and neck pain.  Skin:  Negative for rash.  Neurological: Negative.  Negative for tremors and  numbness.  Hematological:  Negative for adenopathy. Does not bruise/bleed easily.  Psychiatric/Behavioral:  Positive for behavioral problems (Depression), dysphoric mood and sleep disturbance. Negative for self-injury and suicidal ideas. The patient is nervous/anxious.     Vital Signs: BP 135/82 Comment: 190/94, 160/80  Pulse 90 Comment: 114  Temp (!) 97 F (36.1 C)   Resp 16   Ht 5\' 3"  (1.6 m)   Wt 143 lb 6.4 oz (65 kg)   SpO2 98%   BMI 25.40 kg/m    Physical Exam Vitals reviewed.  Constitutional:  General: She is not in acute distress.    Appearance: Normal appearance. She is well-developed, well-groomed and overweight. She is not ill-appearing.  HENT:     Head: Normocephalic and atraumatic.  Eyes:     Pupils: Pupils are equal, round, and reactive to light.  Cardiovascular:     Rate and Rhythm: Normal rate and regular rhythm.  Pulmonary:     Effort: Pulmonary effort is normal. No respiratory distress.  Neurological:     Mental Status: She is alert and oriented to person, place, and time.  Psychiatric:        Attention and Perception: Attention normal.        Mood and Affect: Mood and affect normal.        Speech: Speech normal.        Behavior: Behavior normal. Behavior is cooperative.        Thought Content: Thought content normal.        Judgment: Judgment normal.        Assessment/Plan: 1. Essential hypertension Continue amlodipine as prescribed.  - amLODipine (NORVASC) 10 MG tablet; Take 1 tablet (10 mg total) by mouth daily.  Dispense: 90 tablet; Refill: 3  2. Vasomotor symptoms due to menopause Continue venlafaxine as prescribed  - venlafaxine XR (EFFEXOR-XR) 150 MG 24 hr capsule; Take 1 capsule (150 mg total) by mouth daily with breakfast.  Dispense: 90 capsule; Refill: 3  3. Abnormal weight gain Continue phentermine as prescribed, follow up in 8 weeks  - phentermine (ADIPEX-P) 37.5 MG tablet; Take 1 tablet (37.5 mg total) by mouth daily before  breakfast.  Dispense: 30 tablet; Refill: 1  4. Primary insomnia Continue ambien as prescribed  - zolpidem (AMBIEN) 10 MG tablet; Take 1 tablet (10 mg total) by mouth at bedtime as needed for sleep. for sleep  Dispense: 30 tablet; Refill: 0   General Counseling: Lisa Osborn verbalizes understanding of the findings of todays visit and agrees with plan of treatment. I have discussed any further diagnostic evaluation that may be needed or ordered today. We also reviewed her medications today. she has been encouraged to call the office with any questions or concerns that should arise related to todays visit.    No orders of the defined types were placed in this encounter.   Meds ordered this encounter  Medications   phentermine (ADIPEX-P) 37.5 MG tablet    Sig: Take 1 tablet (37.5 mg total) by mouth daily before breakfast.    Dispense:  30 tablet    Refill:  1    For future refill   venlafaxine XR (EFFEXOR-XR) 150 MG 24 hr capsule    Sig: Take 1 capsule (150 mg total) by mouth daily with breakfast.    Dispense:  90 capsule    Refill:  3    For future refills   zolpidem (AMBIEN) 10 MG tablet    Sig: Take 1 tablet (10 mg total) by mouth at bedtime as needed for sleep. for sleep    Dispense:  30 tablet    Refill:  0    For future refills   amLODipine (NORVASC) 10 MG tablet    Sig: Take 1 tablet (10 mg total) by mouth daily.    Dispense:  90 tablet    Refill:  3    For future refills    Return in about 8 weeks (around 01/04/2023) for F/U, Weight loss, Illiana Losurdo PCP.   Total time spent:30 Minutes Time spent includes review of chart, medications, test results,  and follow up plan with the patient.   [place] Controlled Substance Database was reviewed by me.  This patient was seen by Jonetta Osgood, FNP-C in collaboration with Dr. Clayborn Bigness as a part of collaborative care agreement.   Mikele Sifuentes R. Valetta Fuller, MSN, FNP-C Internal medicine

## 2022-12-16 ENCOUNTER — Other Ambulatory Visit: Payer: Self-pay

## 2022-12-16 DIAGNOSIS — F5101 Primary insomnia: Secondary | ICD-10-CM

## 2022-12-30 ENCOUNTER — Encounter: Payer: BC Managed Care – PPO | Admitting: Nurse Practitioner

## 2023-01-04 ENCOUNTER — Ambulatory Visit: Payer: BC Managed Care – PPO | Admitting: Nurse Practitioner

## 2023-01-05 ENCOUNTER — Ambulatory Visit: Payer: BC Managed Care – PPO | Admitting: Nurse Practitioner

## 2023-01-05 ENCOUNTER — Encounter: Payer: Self-pay | Admitting: Nurse Practitioner

## 2023-01-05 VITALS — BP 135/84 | HR 86 | Temp 98.1°F | Resp 16 | Ht 63.0 in | Wt 140.4 lb

## 2023-01-05 DIAGNOSIS — R635 Abnormal weight gain: Secondary | ICD-10-CM

## 2023-01-05 DIAGNOSIS — N951 Menopausal and female climacteric states: Secondary | ICD-10-CM | POA: Diagnosis not present

## 2023-01-05 DIAGNOSIS — I10 Essential (primary) hypertension: Secondary | ICD-10-CM | POA: Diagnosis not present

## 2023-01-05 DIAGNOSIS — R300 Dysuria: Secondary | ICD-10-CM

## 2023-01-05 DIAGNOSIS — Z0000 Encounter for general adult medical examination without abnormal findings: Secondary | ICD-10-CM | POA: Diagnosis not present

## 2023-01-05 DIAGNOSIS — F5101 Primary insomnia: Secondary | ICD-10-CM

## 2023-01-05 DIAGNOSIS — F418 Other specified anxiety disorders: Secondary | ICD-10-CM

## 2023-01-05 MED ORDER — ZOLPIDEM TARTRATE 10 MG PO TABS: 10.0000 mg | ORAL_TABLET | Freq: Every evening | ORAL | 2 refills | Status: DC | PRN

## 2023-01-05 MED ORDER — VENLAFAXINE HCL ER 75 MG PO CP24: 75.0000 mg | ORAL_CAPSULE | Freq: Every day | ORAL | 0 refills | Status: DC

## 2023-01-05 MED ORDER — PHENTERMINE HCL 37.5 MG PO TABS: 37.5000 mg | ORAL_TABLET | Freq: Every day | ORAL | 1 refills | Status: DC

## 2023-01-05 MED ORDER — AUVELITY 45-105 MG PO TBCR: 1.0000 | EXTENDED_RELEASE_TABLET | Freq: Two times a day (BID) | ORAL | 3 refills | Status: DC

## 2023-01-05 NOTE — Progress Notes (Signed)
[facility] [address] [place], [place] [address]  Internal MEDICINE  Office Visit Note  Patient Name: Lisa Osborn  [id number]  [id number]  Date of Service: 01/05/2023  Chief Complaint  Patient presents with   Depression   Hypertension   Hyperlipidemia   Annual Exam    Weight loss     HPI Hisayo presents for an annual well visit and physical exam.  Well-appearing 57 y.o. female with hypertension, migraines, insomnia, high cholesterol and history of ovarian cancer.  Routine CRC screening: due in 2025 Routine mammogram: due in 2025  Labs: labs deferred for now, not due until December. 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  Tried wellbutrin a long time ago. Wanting to try something to help with depressive and anxious symptoms.    Current Medication: Outpatient Encounter Medications as of 01/05/2023  Medication Sig Note   amLODipine (NORVASC) 10 MG tablet Take 1 tablet (10 mg total) by mouth daily.    Dextromethorphan-buPROPion ER (AUVELITY) 45-105 MG TBCR Take 1 tablet by mouth 2 (two) times daily.    EPINEPHrine 0.3 mg/0.3 mL IJ SOAJ injection Inject 0.3 mg into the muscle as needed for anaphylaxis. 03/23/2021: Prn    estradiol (CLIMARA - DOSED IN MG/24 HR) 0.05 mg/24hr patch PLACE 1 PATCH (0.05 MG TOTAL) ONTO THE SKIN ONCE A WEEK.    hydrochlorothiazide (HYDRODIURIL) 12.5 MG tablet Take 1 tablet (12.5 mg total) by mouth daily.    ibuprofen (ADVIL) 800 MG tablet Take 1 tablet (800 mg total) by mouth every 8 (eight) hours as needed for moderate pain. For AFTER surgery only 03/23/2021: Prn    venlafaxine XR (EFFEXOR-XR) 75 MG 24 hr capsule Take 1 capsule (75 mg total) by mouth daily with breakfast.    [DISCONTINUED] phentermine (ADIPEX-P) 37.5 MG tablet Take 1 tablet (37.5 mg total) by mouth daily before breakfast.    [DISCONTINUED] venlafaxine XR (EFFEXOR-XR) 150 MG 24  hr capsule Take 1 capsule (150 mg total) by mouth daily with breakfast.    [DISCONTINUED] zolpidem (AMBIEN) 10 MG tablet Take 1 tablet (10 mg total) by mouth at bedtime as needed for sleep. for sleep    phentermine (ADIPEX-P) 37.5 MG tablet Take 1 tablet (37.5 mg total) by mouth daily before breakfast.    zolpidem (AMBIEN) 10 MG tablet Take 1 tablet (10 mg total) by mouth at bedtime as needed for sleep. for sleep    No facility-administered encounter medications on file as of 01/05/2023.    Surgical History: Past Surgical History:  Procedure Laterality Date   CESAREAN SECTION     CHOLECYSTECTOMY     COLONOSCOPY WITH PROPOFOL N/A 02/16/2021   Procedure: COLONOSCOPY WITH PROPOFOL;  Surgeon: Toney Reil, MD;  Location: [facility] ENDOSCOPY;  Service: Gastroenterology;  Laterality: N/A;   NOSE SURGERY     Surgery for deviated septum   OTHER SURGICAL HISTORY     Mesh placement for bladder prolapse versus urinary incontinence   ROBOTIC ASSISTED BILATERAL SALPINGO OOPHERECTOMY N/A 03/03/2021   Procedure: XI ROBOTIC ASSISTED BILATERAL SALPINGO OOPHORECTOMY,MINI LAPAROTOMY AND OMENTECTOMY, VAGINAL MASS EXCISION;  Surgeon: Carver Fila, MD;  Location: [facility] ORS;  Service: Gynecology;  Laterality: N/A;   VAGINAL HYSTERECTOMY      Medical History: Past Medical History:  Diagnosis Date   Allergy    Anxiety    Depression    Gout 2020   none since  then   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 in 14s and a second time in his 34s   Brain cancer Brother 85   Breast cancer Paternal Aunt        dx. 60s   Non-Hodgkin's lymphoma Paternal Aunt        dx. 60s   Thyroid cancer Paternal Aunt        dx. 50s   Melanoma  Paternal Aunt        dx. 41s   Pancreatic cancer Paternal Uncle 31   Gastric cancer Paternal Uncle 61   Prostate cancer Paternal Uncle 21   Breast cancer Maternal Grandmother    Lung cancer Paternal Grandmother        smoked, dx. 38s   Lung cancer Paternal Grandfather        smoked, dx. 32s   Breast cancer Cousin        pat cousin, dx. 41s    Social History   Socioeconomic History   Marital status: Married    Spouse name: Not on file   Number of children: Not on file   Years of education: Not on file   Highest education level: Not on file  Occupational History   Not on file  Tobacco Use   Smoking status: Never    Passive exposure: Past   Smokeless tobacco: Never  Vaping Use   Vaping status: Never Used  Substance and Sexual Activity   Alcohol use: Yes    Comment: ocassionally   Drug use: Never   Sexual activity: Not on file  Other Topics Concern   Not on file  Social History Narrative   Not on file   Social Determinants of Health   Financial Resource Strain: Not on file  Food Insecurity: Not on file  Transportation Needs: Not on file  Physical Activity: Not on file  Stress: Not on file  Social Connections: Not on file  Intimate Partner Violence: Not on file      Review of Systems  Constitutional:  Positive for fatigue. Negative for activity change, appetite change, chills, fever and unexpected weight change.  HENT: Negative.  Negative for congestion, ear pain, rhinorrhea, sore throat and trouble swallowing.   Eyes: Negative.   Respiratory: Negative.  Negative for cough, chest tightness, shortness of breath and wheezing.   Cardiovascular: Negative.  Negative for chest pain and palpitations.  Gastrointestinal: Negative.  Negative for abdominal pain, blood in stool, constipation, diarrhea, nausea and vomiting.  Endocrine: Positive for heat intolerance.  Genitourinary: Negative.  Negative for difficulty urinating, dysuria, frequency, hematuria and urgency.   Musculoskeletal: Negative.  Negative for arthralgias, back pain, joint swelling, myalgias and neck pain.  Skin: Negative.  Negative for rash and wound.  Allergic/Immunologic: Negative.  Negative for immunocompromised state.  Neurological: Negative.  Negative for dizziness, seizures, numbness and headaches.  Hematological: Negative.   Psychiatric/Behavioral:  Negative for behavioral problems, self-injury and suicidal ideas. The patient is not nervous/anxious.     Vital Signs: BP 135/84 Comment: 138/90  Pulse 86   Temp 98.1 F (36.7 C)   Resp 16   Ht 5\' 3"  (1.6 m)  Wt 140 lb 6.4 oz (63.7 kg)   SpO2 98%   BMI 24.87 kg/m    Physical Exam Vitals reviewed.  Constitutional:      General: She is awake. She is not in acute distress.    Appearance: Normal appearance. She is well-developed, well-groomed and normal weight. She is not ill-appearing or diaphoretic.  HENT:     Head: Normocephalic and atraumatic.     Right Ear: Tympanic membrane, ear canal and external ear normal.     Left Ear: Tympanic membrane, ear canal and external ear normal.     Nose: Nose normal. No congestion or rhinorrhea.     Mouth/Throat:     Lips: Pink.     Mouth: Mucous membranes are moist.     Pharynx: Oropharynx is clear. Uvula midline. No oropharyngeal exudate.  Eyes:     General: Lids are normal. Vision grossly intact. Gaze aligned appropriately. No scleral icterus.       Right eye: No discharge.        Left eye: No discharge.     Conjunctiva/sclera: Conjunctivae normal.     Pupils: Pupils are equal, round, and reactive to light.     Funduscopic exam:    Right eye: Red reflex present.        Left eye: Red reflex present. Neck:     Thyroid: No thyromegaly.     Vascular: No JVD.     Trachea: Trachea and phonation normal. No tracheal deviation.  Cardiovascular:     Rate and Rhythm: Normal rate and regular rhythm.     Pulses: Normal pulses.     Heart sounds: Normal heart sounds, S1 normal and S2  normal. No murmur heard.    No friction rub. No gallop.  Pulmonary:     Effort: Pulmonary effort is normal. No accessory muscle usage or respiratory distress.     Breath sounds: Normal breath sounds and air entry. No stridor. No decreased breath sounds, wheezing or rales.  Chest:     Chest wall: No tenderness.     Comments: Declined today, breast exam was done by gynecologic oncology recently.  Abdominal:     General: Bowel sounds are normal. There is no distension.     Palpations: Abdomen is soft. There is no mass.     Tenderness: There is no abdominal tenderness. There is no guarding or rebound.  Musculoskeletal:        General: No tenderness or deformity. Normal range of motion.     Cervical back: Normal range of motion and neck supple.     Right lower leg: No edema.     Left lower leg: No edema.  Lymphadenopathy:     Cervical: No cervical adenopathy.  Skin:    General: Skin is warm and dry.     Capillary Refill: Capillary refill takes less than 2 seconds.     Coloration: Skin is not pale.     Findings: No erythema or rash.  Neurological:     Mental Status: She is alert and oriented to person, place, and time.     Cranial Nerves: No cranial nerve deficit.     Motor: No abnormal muscle tone.     Coordination: Coordination normal.     Gait: Gait normal.     Deep Tendon Reflexes: Reflexes are normal and symmetric.  Psychiatric:        Mood and Affect: Mood normal.        Behavior: Behavior normal. Behavior is cooperative.  Thought Content: Thought content normal.        Judgment: Judgment normal.        Assessment/Plan: 1. Essential hypertension Stable, continue current medication as prescribed.   2. Abnormal weight gain Continue phentermine as prescribed, follow up in 8 weeks.  - phentermine (ADIPEX-P) 37.5 MG tablet; Take 1 tablet (37.5 mg total) by mouth daily before breakfast.  Dispense: 30 tablet; Refill: 1  3. Vasomotor symptoms due to  menopause Venlafaxine dose decreased to help with weaning.  - venlafaxine XR (EFFEXOR-XR) 75 MG 24 hr capsule; Take 1 capsule (75 mg total) by mouth daily with breakfast.  Dispense: 30 capsule; Refill: 0  4. Dysuria Routine urinalysis done  - UA/M w/rflx Culture, Routine - Microscopic Examination - Urine Culture, Reflex  5. Depression with anxiety Will try auvelity, sample provided to patient, prescription sent to pharmacy. Venlafaxine dose decreased to help with weaning off.  - Dextromethorphan-buPROPion ER (AUVELITY) 45-105 MG TBCR; Take 1 tablet by mouth 2 (two) times daily.  Dispense: 60 tablet; Refill: 3 - venlafaxine XR (EFFEXOR-XR) 75 MG 24 hr capsule; Take 1 capsule (75 mg total) by mouth daily with breakfast.  Dispense: 30 capsule; Refill: 0  6. Primary insomnia Continue ambien as prescribed.  - zolpidem (AMBIEN) 10 MG tablet; Take 1 tablet (10 mg total) by mouth at bedtime as needed for sleep. for sleep  Dispense: 30 tablet; Refill: 2      General Counseling: Makaleigh verbalizes understanding of the findings of todays visit and agrees with plan of treatment. I have discussed any further diagnostic evaluation that may be needed or ordered today. We also reviewed her medications today. she has been encouraged to call the office with any questions or concerns that should arise related to todays visit.    No orders of the defined types were placed in this encounter.   Meds ordered this encounter  Medications   Dextromethorphan-buPROPion ER (AUVELITY) 45-105 MG TBCR    Sig: Take 1 tablet by mouth 2 (two) times daily.    Dispense:  60 tablet    Refill:  3    Fill new script   venlafaxine XR (EFFEXOR-XR) 75 MG 24 hr capsule    Sig: Take 1 capsule (75 mg total) by mouth daily with breakfast.    Dispense:  30 capsule    Refill:  0   zolpidem (AMBIEN) 10 MG tablet    Sig: Take 1 tablet (10 mg total) by mouth at bedtime as needed for sleep. for sleep    Dispense:  30 tablet     Refill:  2    For future refills   phentermine (ADIPEX-P) 37.5 MG tablet    Sig: Take 1 tablet (37.5 mg total) by mouth daily before breakfast.    Dispense:  30 tablet    Refill:  1    For future refill    Return in about 8 weeks (around 03/02/2023) for F/U, Weight loss, Sirr Kabel PCP, eval new med.   Total time spent:30 Minutes Time spent includes review of chart, medications, test results, and follow up plan with the patient.   [place] Controlled Substance Database was reviewed by me.  This patient was seen by Sallyanne Kuster, FNP-C in collaboration with Dr. Beverely Risen as a part of collaborative care agreement.  Taffany Heiser R. Tedd Sias, MSN, FNP-C Internal medicine

## 2023-01-07 LAB — UA/M W/RFLX CULTURE, ROUTINE
Bilirubin, UA: NEGATIVE
Glucose, UA: NEGATIVE
Ketones, UA: NEGATIVE
Nitrite, UA: NEGATIVE
Protein,UA: NEGATIVE
RBC, UA: NEGATIVE
Specific Gravity, UA: 1.02 (ref 1.005–1.030)
Urobilinogen, Ur: 0.2 mg/dL (ref 0.2–1.0)
pH, UA: 6 (ref 5.0–7.5)

## 2023-01-07 LAB — MICROSCOPIC EXAMINATION
Bacteria, UA: NONE SEEN
Casts: NONE SEEN /LPF
Epithelial Cells (non renal): NONE SEEN /HPF (ref 0–10)
RBC, Urine: NONE SEEN /HPF (ref 0–2)

## 2023-01-07 LAB — URINE CULTURE, REFLEX

## 2023-01-24 ENCOUNTER — Encounter: Payer: Self-pay | Admitting: Nurse Practitioner

## 2023-02-02 ENCOUNTER — Other Ambulatory Visit: Payer: Self-pay | Admitting: Nurse Practitioner

## 2023-02-02 DIAGNOSIS — F418 Other specified anxiety disorders: Secondary | ICD-10-CM

## 2023-02-02 DIAGNOSIS — N951 Menopausal and female climacteric states: Secondary | ICD-10-CM

## 2023-02-28 ENCOUNTER — Encounter: Payer: Self-pay | Admitting: Nurse Practitioner

## 2023-02-28 ENCOUNTER — Ambulatory Visit (INDEPENDENT_AMBULATORY_CARE_PROVIDER_SITE_OTHER): Payer: BC Managed Care – PPO | Admitting: Nurse Practitioner

## 2023-02-28 VITALS — BP 132/88 | HR 82 | Temp 97.9°F | Resp 16 | Ht 63.0 in | Wt 145.0 lb

## 2023-02-28 DIAGNOSIS — F5101 Primary insomnia: Secondary | ICD-10-CM

## 2023-02-28 DIAGNOSIS — R635 Abnormal weight gain: Secondary | ICD-10-CM

## 2023-02-28 DIAGNOSIS — F4321 Adjustment disorder with depressed mood: Secondary | ICD-10-CM

## 2023-02-28 DIAGNOSIS — F418 Other specified anxiety disorders: Secondary | ICD-10-CM | POA: Diagnosis not present

## 2023-02-28 DIAGNOSIS — I10 Essential (primary) hypertension: Secondary | ICD-10-CM

## 2023-02-28 MED ORDER — ZOLPIDEM TARTRATE 10 MG PO TABS: 10.0000 mg | ORAL_TABLET | Freq: Every evening | ORAL | 2 refills | Status: DC | PRN

## 2023-02-28 NOTE — Progress Notes (Signed)
[facility] [address] [place], [place] [address]  Internal MEDICINE  Office Visit Note  Patient Name: Lisa Osborn  [id number]  [id number]  Date of Service: 02/28/2023  Chief Complaint  Patient presents with   Depression   Hypertension   Hyperlipidemia    HPI Lisa Osborn presents for a follow-up visit for depression, weight loss and grief Gayland Curry is helping, forgets to take the second dose sometimes, discussed setting reminders to help her remember to take the second dose. Venlafaxine was tapered off and stopped Grieving loss of mother in law recently.  Weight loss break through the holidays, will discuss again in January Insomnia -- taking ambien, remains effective.    Current Medication: Outpatient Encounter Medications as of 02/28/2023  Medication Sig Note   amLODipine (NORVASC) 10 MG tablet Take 1 tablet (10 mg total) by mouth daily.    Dextromethorphan-buPROPion ER (AUVELITY) 45-105 MG TBCR Take 1 tablet by mouth 2 (two) times daily.    EPINEPHrine 0.3 mg/0.3 mL IJ SOAJ injection Inject 0.3 mg into the muscle as needed for anaphylaxis. 03/23/2021: Prn    estradiol (CLIMARA - DOSED IN MG/24 HR) 0.05 mg/24hr patch PLACE 1 PATCH (0.05 MG TOTAL) ONTO THE SKIN ONCE A WEEK.    hydrochlorothiazide (HYDRODIURIL) 12.5 MG tablet Take 1 tablet (12.5 mg total) by mouth daily.    ibuprofen (ADVIL) 800 MG tablet Take 1 tablet (800 mg total) by mouth every 8 (eight) hours as needed for moderate pain. For AFTER surgery only 03/23/2021: Prn    phentermine (ADIPEX-P) 37.5 MG tablet Take 1 tablet (37.5 mg total) by mouth daily before breakfast.    [DISCONTINUED] venlafaxine XR (EFFEXOR-XR) 75 MG 24 hr capsule TAKE 1 CAPSULE BY MOUTH DAILY WITH BREAKFAST.    [DISCONTINUED] zolpidem (AMBIEN) 10 MG tablet Take 1 tablet (10 mg total) by mouth at bedtime as needed for sleep. for sleep    zolpidem (AMBIEN) 10 MG tablet Take 1 tablet (10 mg total) by mouth at bedtime as needed for sleep.  for sleep    No facility-administered encounter medications on file as of 02/28/2023.    Surgical History: Past Surgical History:  Procedure Laterality Date   CESAREAN SECTION     CHOLECYSTECTOMY     COLONOSCOPY WITH PROPOFOL N/A 02/16/2021   Procedure: COLONOSCOPY WITH PROPOFOL;  Surgeon: Toney Reil, MD;  Location[facility] ENDOSCOPY;  Service: Gastroenterology;  Laterality: N/A;   NOSE SURGERY     Surgery for deviated septum   OTHER SURGICAL HISTORY     Mesh placement for bladder prolapse versus urinary incontinence   ROBOTIC ASSISTED BILATERAL SALPINGO OOPHERECTOMY N/A 03/03/2021   Procedure: XI ROBOTIC ASSISTED BILATERAL SALPINGO OOPHORECTOMY,MINI LAPAROTOMY AND OMENTECTOMY, VAGINAL MASS EXCISION;  Surgeon: Carver Fila, MD;  Location: [facility] ORS;  Service: Gynecology;  Laterality: N/A;   VAGINAL HYSTERECTOMY      Medical History: Past Medical History:  Diagnosis Date   Allergy    Anxiety    Depression    Gout 2020   none since then   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 in 64s and a second time in his 43s  Brain cancer Brother 42   Breast cancer Paternal Aunt        dx. 60s   Non-Hodgkin's lymphoma Paternal Aunt        dx. 60s   Thyroid cancer Paternal Aunt        dx. 50s   Melanoma Paternal Aunt        dx. 73s   Pancreatic cancer Paternal Uncle 26   Gastric cancer Paternal Uncle 90   Prostate cancer Paternal Uncle 2   Breast cancer Maternal Grandmother    Lung cancer Paternal Grandmother        smoked, dx. 65s   Lung cancer Paternal Grandfather        smoked, dx. 61s   Breast cancer Cousin        pat cousin, dx. 23s    Social History   Socioeconomic History   Marital  status: Married    Spouse name: Not on file   Number of children: Not on file   Years of education: Not on file   Highest education level: Not on file  Occupational History   Not on file  Tobacco Use   Smoking status: Never    Passive exposure: Past   Smokeless tobacco: Never  Vaping Use   Vaping status: Never Used  Substance and Sexual Activity   Alcohol use: Yes    Comment: ocassionally   Drug use: Never   Sexual activity: Not on file  Other Topics Concern   Not on file  Social History Narrative   Not on file   Social Determinants of Health   Financial Resource Strain: Not on file  Food Insecurity: Not on file  Transportation Needs: Not on file  Physical Activity: Not on file  Stress: Not on file  Social Connections: Not on file  Intimate Partner Violence: Not on file      Review of Systems  Constitutional:  Positive for diaphoresis (night sweats). Negative for chills, fatigue and unexpected weight change.  HENT:  Positive for postnasal drip. Negative for congestion, rhinorrhea, sneezing and sore throat.   Eyes:  Negative for redness.  Respiratory:  Negative for cough, chest tightness, shortness of breath and wheezing.   Cardiovascular: Negative.  Negative for chest pain and palpitations.  Gastrointestinal:  Negative for abdominal pain, constipation, diarrhea, nausea and vomiting.  Endocrine: Positive for heat intolerance (hot flashes).  Genitourinary:  Negative for dysuria and frequency.  Musculoskeletal: Negative.  Negative for arthralgias, back pain, joint swelling and neck pain.  Skin:  Negative for rash.  Neurological: Negative.  Negative for tremors and numbness.  Hematological:  Negative for adenopathy. Does not bruise/bleed easily.  Psychiatric/Behavioral:  Positive for behavioral problems (Depression), dysphoric mood and sleep disturbance. Negative for self-injury and suicidal ideas. The patient is nervous/anxious.     Vital Signs: BP 132/88   Pulse 82    Temp 97.9 F (36.6 C)   Resp 16   Ht 5\' 3"  (1.6 m)   Wt 145 lb (65.8 kg)   SpO2 98%   BMI 25.69 kg/m    Physical Exam Vitals reviewed.  Constitutional:      General: She is not in acute distress.    Appearance: Normal appearance. She is well-developed, well-groomed and overweight. She is not ill-appearing.  HENT:     Head: Normocephalic and atraumatic.  Eyes:     Pupils: Pupils are equal, round, and reactive to light.  Cardiovascular:     Rate and Rhythm: Normal rate and regular rhythm.  Pulmonary:     Effort: Pulmonary effort is normal. No respiratory distress.  Neurological:     Mental Status: She is alert and oriented to person, place, and time.  Psychiatric:        Attention and Perception: Attention normal.        Mood and Affect: Mood and affect normal.        Speech: Speech normal.        Behavior: Behavior normal. Behavior is cooperative.        Thought Content: Thought content normal.        Judgment: Judgment normal.        Assessment/Plan: 1. Essential hypertension Stable, continue current medications as prescribed.   2. Abnormal weight gain Not a priority right now, will take a break from phentermine and discuss trying again in January   3. Primary insomnia Continue ambien as prescribed, follow up in 3 months for additional refills .  - zolpidem (AMBIEN) 10 MG tablet; Take 1 tablet (10 mg total) by mouth at bedtime as needed for sleep. for sleep  Dispense: 30 tablet; Refill: 2  4. Depression with anxiety Doing well with auvelity, continue as prescribed.   5. Grieving Appropriate grieving, no issues    General Counseling: Lisa Osborn verbalizes understanding of the findings of todays visit and agrees with plan of treatment. I have discussed any further diagnostic evaluation that may be needed or ordered today. We also reviewed her medications today. she has been encouraged to call the office with any questions or concerns that should arise related to  todays visit.    No orders of the defined types were placed in this encounter.   Meds ordered this encounter  Medications   zolpidem (AMBIEN) 10 MG tablet    Sig: Take 1 tablet (10 mg total) by mouth at bedtime as needed for sleep. for sleep    Dispense:  30 tablet    Refill:  2    For future refills    Return in about 3 months (around 05/26/2023) for F/U, med refill, Isidro Monks PCP ambien.   Total time spent:30 Minutes Time spent includes review of chart, medications, test results, and follow up plan with the patient.   [place] Controlled Substance Database was reviewed by me.  This patient was seen by Sallyanne Kuster, FNP-C in collaboration with Dr. Beverely Risen as a part of collaborative care agreement.   Charolette Bultman R. Tedd Sias, MSN, FNP-C Internal medicine

## 2023-03-06 ENCOUNTER — Ambulatory Visit
Admission: EM | Admit: 2023-03-06 | Discharge: 2023-03-06 | Disposition: A | Discharge status: Home / Self Care | Payer: BC Managed Care – PPO | Attending: Emergency Medicine | Admitting: Emergency Medicine

## 2023-03-14 ENCOUNTER — Encounter: Payer: Self-pay | Admitting: Nurse Practitioner

## 2023-03-14 ENCOUNTER — Telehealth (INDEPENDENT_AMBULATORY_CARE_PROVIDER_SITE_OTHER): Payer: BC Managed Care – PPO | Admitting: Nurse Practitioner

## 2023-03-14 VITALS — Resp 16 | Ht 63.0 in | Wt 143.0 lb

## 2023-03-14 DIAGNOSIS — N202 Calculus of kidney with calculus of ureter: Secondary | ICD-10-CM

## 2023-03-14 MED ORDER — TAMSULOSIN HCL 0.4 MG PO CAPS: 0.4000 mg | ORAL_CAPSULE | Freq: Every day | ORAL | 1 refills | Status: DC

## 2023-03-14 MED ORDER — OXYCODONE-ACETAMINOPHEN 7.5-325 MG PO TABS: 1.0000 | ORAL_TABLET | ORAL | 0 refills | Status: DC | PRN

## 2023-03-14 MED ORDER — PHENAZOPYRIDINE HCL 200 MG PO TABS
200.0000 mg | ORAL_TABLET | Freq: Three times a day (TID) | ORAL | 0 refills | Status: DC | PRN
Start: 2023-03-14 — End: 2023-07-19

## 2023-03-14 NOTE — Progress Notes (Signed)
[facility] [address] [place], [place] [address]  Internal MEDICINE  Telephone Visit  Patient Name: Lisa Osborn  [id number]  [id number]  Date of Service: 03/14/2023  I connected with the patient at 1600 by telephone and verified the patients identity using two identifiers.   I discussed the limitations, risks, security and privacy concerns of performing an evaluation and management service by telephone and the availability of in person appointments. I also discussed with the patient that there may be a patient responsible charge related to the service.  The patient expressed understanding and agrees to proceed.    Chief Complaint  Patient presents with  . Telephone Screen    Kidney stone.  . Telephone Assessment    HPI Maycie presents for a telehealth virtual visit for   Current Medication: Outpatient Encounter Medications as of 03/14/2023  Medication Sig Note  . amLODipine (NORVASC) 10 MG tablet Take 1 tablet (10 mg total) by mouth daily.   Marland Kitchen Dextromethorphan-buPROPion ER (AUVELITY) 45-105 MG TBCR Take 1 tablet by mouth 2 (two) times daily.   Marland Kitchen EPINEPHrine 0.3 mg/0.3 mL IJ SOAJ injection Inject 0.3 mg into the muscle as needed for anaphylaxis. 03/23/2021: Prn   . estradiol (CLIMARA - DOSED IN MG/24 HR) 0.05 mg/24hr patch PLACE 1 PATCH (0.05 MG TOTAL) ONTO THE SKIN ONCE A WEEK.   . hydrochlorothiazide (HYDRODIURIL) 12.5 MG tablet Take 1 tablet (12.5 mg total) by mouth daily.   Marland Kitchen ibuprofen (ADVIL) 800 MG tablet Take 1 tablet (800 mg total) by mouth every 8 (eight) hours as needed for moderate pain. For AFTER surgery only 03/23/2021: Prn   . phentermine (ADIPEX-P) 37.5 MG tablet Take 1 tablet (37.5 mg total) by mouth daily before breakfast.   . zolpidem (AMBIEN) 10 MG tablet Take 1 tablet (10 mg total) by mouth at bedtime as needed for sleep. for sleep    No facility-administered encounter medications on file as of 03/14/2023.    Surgical History: Past Surgical  History:  Procedure Laterality Date  . CESAREAN SECTION    . CHOLECYSTECTOMY    . COLONOSCOPY WITH PROPOFOL N/A 02/16/2021   Procedure: COLONOSCOPY WITH PROPOFOL;  Surgeon: Toney Reil, MD;  Location: [facility] ENDOSCOPY;  Service: Gastroenterology;  Laterality: N/A;  . NOSE SURGERY     Surgery for deviated septum  . OTHER SURGICAL HISTORY     Mesh placement for bladder prolapse versus urinary incontinence  . ROBOTIC ASSISTED BILATERAL SALPINGO OOPHERECTOMY N/A 03/03/2021   Procedure: XI ROBOTIC ASSISTED BILATERAL SALPINGO OOPHORECTOMY,MINI LAPAROTOMY AND OMENTECTOMY, VAGINAL MASS EXCISION;  Surgeon: Carver Fila, MD;  Location: [facility] ORS;  Service: Gynecology;  Laterality: N/A;  . VAGINAL HYSTERECTOMY      Medical History: Past Medical History:  Diagnosis Date  . Allergy   . Anxiety   . Depression   . Gout 2020   none since then  . 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 HLD (hyperlipidemia)   . Hypertension     Family History: Family History  Problem Relation Age of Onset  . Hyperlipidemia Mother   . Hyperlipidemia Father   . Hypertension Father   . Renal cancer Father 67  . Prostate cancer Father 23  . Melanoma Father        dx. in 84s and a second time in his 81s  . Brain cancer Brother 22  . Breast cancer Paternal Aunt        dx. 29s  . Non-Hodgkin's lymphoma Paternal  Aunt        dx. 60s  . Thyroid cancer Paternal Aunt        dx. 66s  . Melanoma Paternal Aunt        dx. 9s  . Pancreatic cancer Paternal Uncle 56  . Gastric cancer Paternal Uncle 60  . Prostate cancer Paternal Uncle 38  . Breast cancer Maternal Grandmother   . Lung cancer Paternal Grandmother        smoked, dx. 58s  . Lung cancer Paternal Grandfather        smoked, dx. 109s  . Breast cancer Cousin        pat cousin, dx. 46s    Social History   Socioeconomic History  . Marital status: Married    Spouse name: Not on file  . Number of children: Not on file  . Years of  education: Not on file  . Highest education level: Not on file  Occupational History  . Not on file  Tobacco Use  . Smoking status: Never    Passive exposure: Past  . Smokeless tobacco: Never  Vaping Use  . Vaping status: Never Used  Substance and Sexual Activity  . Alcohol use: Yes    Comment: ocassionally  . Drug use: Never  . Sexual activity: Not on file  Other Topics Concern  . Not on file  Social History Narrative  . Not on file   Social Determinants of Health   Financial Resource Strain: Not on file  Food Insecurity: Not on file  Transportation Needs: Not on file  Physical Activity: Not on file  Stress: Not on file  Social Connections: Not on file  Intimate Partner Violence: Not on file      Review of Systems  Vital Signs: Resp 16   Ht 5\' 3"  (1.6 m)   Wt 143 lb (64.9 kg)   BMI 25.33 kg/m    Observation/Objective:     Assessment/Plan:   General Counseling: Jerra verbalizes understanding of the findings of today's phone visit and agrees with plan of treatment. I have discussed any further diagnostic evaluation that may be needed or ordered today. We also reviewed her medications today. she has been encouraged to call the office with any questions or concerns that should arise related to todays visit.  No follow-ups on file.   No orders of the defined types were placed in this encounter.   No orders of the defined types were placed in this encounter.   Time spent:*** Minutes Time spent with patient included reviewing progress notes, labs, imaging studies, and discussing plan for follow up.  [place] Controlled Substance Database was reviewed by me for overdose risk score (ORS) if appropriate.  This patient was seen by Sallyanne Kuster, FNP-C in collaboration with Dr. Beverely Risen as a part of collaborative care agreement.  Anguel Delapena R. Tedd Sias, MSN, FNP-C Internal medicine

## 2023-03-15 ENCOUNTER — Encounter: Payer: Self-pay | Admitting: Nurse Practitioner

## 2023-03-15 ENCOUNTER — Telehealth: Payer: Self-pay

## 2023-03-15 NOTE — Telephone Encounter (Signed)
Left message for patient to give office a call.

## 2023-04-25 ENCOUNTER — Other Ambulatory Visit: Payer: Self-pay

## 2023-04-25 DIAGNOSIS — F418 Other specified anxiety disorders: Secondary | ICD-10-CM

## 2023-04-25 MED ORDER — AUVELITY 45-105 MG PO TBCR: 1.0000 | EXTENDED_RELEASE_TABLET | Freq: Two times a day (BID) | ORAL | 3 refills | Status: DC

## 2023-05-26 ENCOUNTER — Encounter: Payer: Self-pay | Admitting: Nurse Practitioner

## 2023-05-26 ENCOUNTER — Other Ambulatory Visit: Payer: Self-pay | Admitting: Nurse Practitioner

## 2023-05-26 ENCOUNTER — Ambulatory Visit (INDEPENDENT_AMBULATORY_CARE_PROVIDER_SITE_OTHER): Payer: Self-pay | Admitting: Nurse Practitioner

## 2023-05-26 VITALS — BP 136/82 | HR 95 | Temp 98.2°F | Resp 16 | Ht 63.0 in | Wt 151.6 lb

## 2023-05-26 DIAGNOSIS — N951 Menopausal and female climacteric states: Secondary | ICD-10-CM

## 2023-05-26 DIAGNOSIS — I10 Essential (primary) hypertension: Secondary | ICD-10-CM | POA: Diagnosis not present

## 2023-05-26 DIAGNOSIS — E663 Overweight: Secondary | ICD-10-CM

## 2023-05-26 DIAGNOSIS — F5101 Primary insomnia: Secondary | ICD-10-CM | POA: Diagnosis not present

## 2023-05-26 DIAGNOSIS — F418 Other specified anxiety disorders: Secondary | ICD-10-CM

## 2023-05-26 DIAGNOSIS — Z6827 Body mass index (BMI) 27.0-27.9, adult: Secondary | ICD-10-CM

## 2023-05-26 MED ORDER — ZOLPIDEM TARTRATE 10 MG PO TABS: 10.0000 mg | ORAL_TABLET | Freq: Every evening | ORAL | 2 refills | Status: DC | PRN

## 2023-05-26 MED ORDER — AUVELITY 45-105 MG PO TBCR: 1.0000 | EXTENDED_RELEASE_TABLET | Freq: Two times a day (BID) | ORAL | 3 refills | Status: DC

## 2023-05-26 MED ORDER — HYDROCHLOROTHIAZIDE 12.5 MG PO TABS: 12.5000 mg | ORAL_TABLET | Freq: Every day | ORAL | 3 refills | Status: DC

## 2023-05-26 MED ORDER — PHENTERMINE HCL 37.5 MG PO TABS: 37.5000 mg | ORAL_TABLET | Freq: Every day | ORAL | 1 refills | Status: DC

## 2023-05-26 NOTE — Progress Notes (Signed)
 [facility] [address] [place], [place] [address]  Internal MEDICINE  Office Visit Note  Patient Name: Lisa Osborn  [id number]  [id number]  Date of Service: 05/26/2023  Chief Complaint  Patient presents with   Depression   Hypertension   Hyperlipidemia   Follow-up    HPI Lisa Osborn presents for a follow-up visit for hypertension, anxiety, depression, insomnia and weight loss.  Hypertension -- controlled with hydrochlorothiazide   Anxiety/depression -- taking auvelity  still, often forgets to take 2nd dose each day but doing fine with once a day. Will consider taking twice daily if she feels like she needs it.  Insomnia -- taking ambien  which remains effective. Due for refills Weight loss -- stopped taking phentermine  for a while, but has restarted and wants to continue, has gained a few lbs. Issues with right foot makes it difficult to do physical activity for now.  Worsening carpal tunnel and right plantar fasciitis, will be seeing specialist soon. Podiatry and orthopedic for hands. At [facility] clinic.      Current Medication: Outpatient Encounter Medications as of 05/26/2023  Medication Sig Note   amLODipine  (NORVASC ) 10 MG tablet Take 1 tablet (10 mg total) by mouth daily.    EPINEPHrine 0.3 mg/0.3 mL IJ SOAJ injection Inject 0.3 mg into the muscle as needed for anaphylaxis. 03/23/2021: Prn    estradiol  (CLIMARA  - DOSED IN MG/24 HR) 0.05 mg/24hr patch PLACE 1 PATCH (0.05 MG TOTAL) ONTO THE SKIN ONCE A WEEK.    ibuprofen  (ADVIL ) 800 MG tablet Take 1 tablet (800 mg total) by mouth every 8 (eight) hours as needed for moderate pain. For AFTER surgery only 03/23/2021: Prn    ondansetron  (ZOFRAN -ODT) 4 MG disintegrating tablet Take 4 mg by mouth every 8 (eight) hours as needed.    oxyCODONE -acetaminophen  (PERCOCET) 7.5-325 MG tablet Take 1 tablet by mouth every 4 (four) hours as needed for severe pain (pain score 7-10).    phenazopyridine  (PYRIDIUM ) 200 MG tablet Take 1  tablet (200 mg total) by mouth 3 (three) times daily as needed for pain.    tamsulosin  (FLOMAX ) 0.4 MG CAPS capsule Take 1 capsule (0.4 mg total) by mouth daily.    [DISCONTINUED] Dextromethorphan-buPROPion ER (AUVELITY ) 45-105 MG TBCR Take 1 tablet by mouth 2 (two) times daily.    [DISCONTINUED] hydrochlorothiazide  (HYDRODIURIL ) 12.5 MG tablet Take 1 tablet (12.5 mg total) by mouth daily.    [DISCONTINUED] phentermine  (ADIPEX-P ) 37.5 MG tablet Take 1 tablet (37.5 mg total) by mouth daily before breakfast.    [DISCONTINUED] zolpidem  (AMBIEN ) 10 MG tablet Take 1 tablet (10 mg total) by mouth at bedtime as needed for sleep. for sleep    Dextromethorphan-buPROPion ER (AUVELITY ) 45-105 MG TBCR Take 1 tablet by mouth 2 (two) times daily.    hydrochlorothiazide  (HYDRODIURIL ) 12.5 MG tablet Take 1 tablet (12.5 mg total) by mouth daily.    phentermine  (ADIPEX-P ) 37.5 MG tablet Take 1 tablet (37.5 mg total) by mouth daily before breakfast.    zolpidem  (AMBIEN ) 10 MG tablet Take 1 tablet (10 mg total) by mouth at bedtime as needed for sleep. for sleep    No facility-administered encounter medications on file as of 05/26/2023.    Surgical History: Past Surgical History:  Procedure Laterality Date   CESAREAN SECTION     CHOLECYSTECTOMY     COLONOSCOPY WITH PROPOFOL  N/A 02/16/2021   Procedure: COLONOSCOPY WITH PROPOFOL ;  Surgeon: Unk Corinn Skiff, MD;  Location: [facility] ENDOSCOPY;  Service: Gastroenterology;  Laterality: N/A;  NOSE SURGERY     Surgery for deviated septum   OTHER SURGICAL HISTORY     Mesh placement for bladder prolapse versus urinary incontinence   ROBOTIC ASSISTED BILATERAL SALPINGO OOPHERECTOMY N/A 03/03/2021   Procedure: XI ROBOTIC ASSISTED BILATERAL SALPINGO OOPHORECTOMY,MINI LAPAROTOMY AND OMENTECTOMY, VAGINAL MASS EXCISION;  Surgeon: Viktoria Comer SAUNDERS, MD;  Location: [facility] ORS;  Service: Gynecology;  Laterality: N/A;   VAGINAL HYSTERECTOMY      Medical History: Past Medical  History:  Diagnosis Date   Allergy    Anxiety    Depression    Gout 2020   none since then   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 in 87s and a second time in his 19s   Brain cancer Brother 53   Breast cancer Paternal Aunt        dx. 60s   Non-Hodgkin's lymphoma Paternal Aunt        dx. 64s   Thyroid  cancer Paternal Aunt        dx. 50s   Melanoma Paternal Aunt        dx. 68s   Pancreatic cancer Paternal Uncle 54   Gastric cancer Paternal Uncle 45   Prostate cancer Paternal Uncle 29   Breast cancer Maternal Grandmother    Lung cancer Paternal Grandmother        smoked, dx. 70s   Lung cancer Paternal Grandfather        smoked, dx. 61s   Breast cancer Cousin        pat cousin, dx. 59s    Social History   Socioeconomic History   Marital status: Married    Spouse name: Not on file   Number of children: Not on file   Years of education: Not on file   Highest education level: Not on file  Occupational History   Not on file  Tobacco Use   Smoking status: Never    Passive exposure: Past   Smokeless tobacco: Never  Vaping Use   Vaping status: Never Used  Substance and Sexual Activity   Alcohol use: Yes    Comment: ocassionally   Drug use: Never   Sexual activity: Not on file  Other Topics Concern   Not on file  Social History Narrative   Not on file   Social Drivers of Health   Financial Resource Strain: Not on file  Food Insecurity: Not on file  Transportation Needs: Not on file  Physical Activity: Not on file  Stress: Not on file  Social Connections: Not on file  Intimate Partner Violence: Not on file      Review of Systems  Constitutional:  Positive for diaphoresis (night  sweats). Negative for chills, fatigue and unexpected weight change.  HENT:  Positive for postnasal drip. Negative for congestion, rhinorrhea, sneezing and sore throat.   Eyes:  Negative for redness.  Respiratory:  Negative for cough, chest tightness, shortness of breath and wheezing.   Cardiovascular: Negative.  Negative for chest pain and palpitations.  Gastrointestinal:  Negative for abdominal pain, constipation, diarrhea, nausea and vomiting.  Endocrine: Positive for heat intolerance (hot flashes).  Genitourinary:  Negative for dysuria  and frequency.  Musculoskeletal: Negative.  Negative for arthralgias, back pain, joint swelling and neck pain.  Skin:  Negative for rash.  Neurological: Negative.  Negative for tremors and numbness.  Hematological:  Negative for adenopathy. Does not bruise/bleed easily.  Psychiatric/Behavioral:  Positive for behavioral problems (Depression), dysphoric mood and sleep disturbance. Negative for self-injury and suicidal ideas. The patient is nervous/anxious.     Vital Signs: BP 136/82   Pulse 95   Temp 98.2 F (36.8 C)   Resp 16   Ht 5' 3 (1.6 m)   Wt 151 lb 9.6 oz (68.8 kg)   SpO2 96%   BMI 26.85 kg/m    Physical Exam Vitals reviewed.  Constitutional:      General: She is not in acute distress.    Appearance: Normal appearance. She is well-developed, well-groomed and overweight. She is not ill-appearing.  HENT:     Head: Normocephalic and atraumatic.  Eyes:     Pupils: Pupils are equal, round, and reactive to light.  Cardiovascular:     Rate and Rhythm: Normal rate and regular rhythm.  Pulmonary:     Effort: Pulmonary effort is normal. No respiratory distress.  Neurological:     Mental Status: She is alert and oriented to person, place, and time.  Psychiatric:        Attention and Perception: Attention normal.        Mood and Affect: Mood and affect normal.        Speech: Speech normal.        Behavior: Behavior normal. Behavior is  cooperative.        Thought Content: Thought content normal.        Judgment: Judgment normal.        Assessment/Plan: 1. Essential hypertension (Primary) Stable, continue amlodipine  and hydrochlorothiazide  as prescribed.  - hydrochlorothiazide  (HYDRODIURIL ) 12.5 MG tablet; Take 1 tablet (12.5 mg total) by mouth daily.  Dispense: 90 tablet; Refill: 3  2. Vasomotor symptoms due to menopause Continue climara  patch, prescribed by obgyn   3. Primary insomnia Continue ambien  as prescribed. Follow up in 3 months for additional refills - zolpidem  (AMBIEN ) 10 MG tablet; Take 1 tablet (10 mg total) by mouth at bedtime as needed for sleep. for sleep  Dispense: 30 tablet; Refill: 2  4. Depression with anxiety Continue auvelity  as prescribed  - Dextromethorphan-buPROPion ER (AUVELITY ) 45-105 MG TBCR; Take 1 tablet by mouth 2 (two) times daily.  Dispense: 60 tablet; Refill: 3  5. Overweight with body mass index (BMI) of 27 to 27.9 in adult Continue phentermine  as prescribed. Follow up in 8 weeks for weigh in  - phentermine  (ADIPEX-P ) 37.5 MG tablet; Take 1 tablet (37.5 mg total) by mouth daily before breakfast.  Dispense: 30 tablet; Refill: 1   General Counseling: Bryce verbalizes understanding of the findings of todays visit and agrees with plan of treatment. I have discussed any further diagnostic evaluation that may be needed or ordered today. We also reviewed her medications today. she has been encouraged to call the office with any questions or concerns that should arise related to todays visit.    No orders of the defined types were placed in this encounter.   Meds ordered this encounter  Medications   phentermine  (ADIPEX-P ) 37.5 MG tablet    Sig: Take 1 tablet (37.5 mg total) by mouth daily before breakfast.    Dispense:  30 tablet    Refill:  1    For future refill   zolpidem  (  AMBIEN ) 10 MG tablet    Sig: Take 1 tablet (10 mg total) by mouth at bedtime as needed for sleep. for  sleep    Dispense:  30 tablet    Refill:  2    For future refills   hydrochlorothiazide  (HYDRODIURIL ) 12.5 MG tablet    Sig: Take 1 tablet (12.5 mg total) by mouth daily.    Dispense:  90 tablet    Refill:  3    For future refills   Dextromethorphan-buPROPion ER (AUVELITY ) 45-105 MG TBCR    Sig: Take 1 tablet by mouth 2 (two) times daily.    Dispense:  60 tablet    Refill:  3    For future refills    Return in about 8 weeks (around 07/21/2023) for F/U, Weight loss, Asaiah Hunnicutt PCP.   Total time spent:30 Minutes Time spent includes review of chart, medications, test results, and follow up plan with the patient.   [place] Controlled Substance Database was reviewed by me.  This patient was seen by Mardy Maxin, FNP-C in collaboration with Dr. Sigrid Bathe as a part of collaborative care agreement.   Julius Matus R. Maxin, MSN, FNP-C Internal medicine

## 2023-05-26 NOTE — Addendum Note (Signed)
 Addended by: Sallyanne Kuster on: 05/26/2023 12:52 PM   Modules accepted: Orders

## 2023-05-26 NOTE — Telephone Encounter (Signed)
 Not covered.

## 2023-07-19 ENCOUNTER — Encounter: Payer: Self-pay | Admitting: Nurse Practitioner

## 2023-07-19 ENCOUNTER — Ambulatory Visit: Payer: 59 | Admitting: Nurse Practitioner

## 2023-07-19 VITALS — BP 136/88 | HR 95 | Temp 97.6°F | Resp 16 | Ht 63.0 in | Wt 145.4 lb

## 2023-07-19 DIAGNOSIS — J069 Acute upper respiratory infection, unspecified: Secondary | ICD-10-CM | POA: Diagnosis not present

## 2023-07-19 DIAGNOSIS — F5101 Primary insomnia: Secondary | ICD-10-CM | POA: Diagnosis not present

## 2023-07-19 DIAGNOSIS — E663 Overweight: Secondary | ICD-10-CM

## 2023-07-19 DIAGNOSIS — Z6827 Body mass index (BMI) 27.0-27.9, adult: Secondary | ICD-10-CM | POA: Diagnosis not present

## 2023-07-19 MED ORDER — AZITHROMYCIN 250 MG PO TABS: ORAL_TABLET | ORAL | 0 refills | Status: AC

## 2023-07-19 MED ORDER — PHENTERMINE HCL 37.5 MG PO TABS: 37.5000 mg | ORAL_TABLET | Freq: Every day | ORAL | 1 refills | Status: DC

## 2023-07-19 MED ORDER — ZOLPIDEM TARTRATE 10 MG PO TABS: 10.0000 mg | ORAL_TABLET | Freq: Every evening | ORAL | 0 refills | Status: DC | PRN

## 2023-07-19 NOTE — Progress Notes (Signed)
 [facility] [address] [place], [place] [address]  Internal MEDICINE  Office Visit Note  Patient Name: Lisa Osborn  [id number]  [id number]  Date of Service: 07/19/2023  Chief Complaint  Patient presents with   Depression   Hyperlipidemia   Hypertension   Follow-up    Weight loss     HPI Lakesha presents for a follow-up visit for URI symptoms, weight loss, hypertension and refills.  Has been in and out of the hospital with her grandson who had the flu and now her husband is sick as well. Has been coughing, had a fever. Symptoms started around 4 days ago.  Weight loss -- lost 6 lbs -- on phentermine and has been sick lately Hypertension -- controlled with current medications.     Current Medication: Outpatient Encounter Medications as of 07/19/2023  Medication Sig Note   amLODipine (NORVASC) 10 MG tablet Take 1 tablet (10 mg total) by mouth daily.    azithromycin (ZITHROMAX) 250 MG tablet Take 2 tablets on day 1, then 1 tablet daily on days 2 through 5    Dextromethorphan-buPROPion ER (AUVELITY) 45-105 MG TBCR Take 1 tablet by mouth 2 (two) times daily.    EPINEPHrine 0.3 mg/0.3 mL IJ SOAJ injection Inject 0.3 mg into the muscle as needed for anaphylaxis. 03/23/2021: Prn    estradiol (CLIMARA - DOSED IN MG/24 HR) 0.05 mg/24hr patch PLACE 1 PATCH (0.05 MG TOTAL) ONTO THE SKIN ONCE A WEEK.    hydrochlorothiazide (HYDRODIURIL) 12.5 MG tablet Take 1 tablet (12.5 mg total) by mouth daily.    ibuprofen (ADVIL) 800 MG tablet Take 1 tablet (800 mg total) by mouth every 8 (eight) hours as needed for moderate pain. For AFTER surgery only 03/23/2021: Prn    ondansetron (ZOFRAN-ODT) 4 MG disintegrating tablet Take 4 mg by mouth every 8 (eight) hours as needed.    [DISCONTINUED] oxyCODONE-acetaminophen (PERCOCET) 7.5-325 MG tablet Take 1 tablet by mouth every 4 (four) hours as needed for severe pain (pain score 7-10).    [DISCONTINUED] phenazopyridine (PYRIDIUM) 200 MG tablet  Take 1 tablet (200 mg total) by mouth 3 (three) times daily as needed for pain.    [DISCONTINUED] phentermine (ADIPEX-P) 37.5 MG tablet Take 1 tablet (37.5 mg total) by mouth daily before breakfast.    [DISCONTINUED] tamsulosin (FLOMAX) 0.4 MG CAPS capsule Take 1 capsule (0.4 mg total) by mouth daily.    [DISCONTINUED] zolpidem (AMBIEN) 10 MG tablet Take 1 tablet (10 mg total) by mouth at bedtime as needed for sleep. for sleep    phentermine (ADIPEX-P) 37.5 MG tablet Take 1 tablet (37.5 mg total) by mouth daily before breakfast.    zolpidem (AMBIEN) 10 MG tablet Take 1 tablet (10 mg total) by mouth at bedtime as needed for sleep. for sleep    No facility-administered encounter medications on file as of 07/19/2023.    Surgical History: Past Surgical History:  Procedure Laterality Date   CESAREAN SECTION     CHOLECYSTECTOMY     COLONOSCOPY WITH PROPOFOL N/A 02/16/2021   Procedure: COLONOSCOPY WITH PROPOFOL;  Surgeon: Toney Reil, MD;  Location: [facility] ENDOSCOPY;  Service: Gastroenterology;  Laterality: N/A;   NOSE SURGERY     Surgery for deviated septum   OTHER SURGICAL HISTORY     Mesh placement for bladder prolapse versus urinary incontinence   ROBOTIC ASSISTED BILATERAL SALPINGO OOPHERECTOMY N/A 03/03/2021   Procedure: XI ROBOTIC ASSISTED BILATERAL SALPINGO OOPHORECTOMY,MINI LAPAROTOMY AND OMENTECTOMY, VAGINAL MASS EXCISION;  Surgeon: Eugene Garnet  R, MD;  Location: [facility] ORS;  Service: Gynecology;  Laterality: N/A;   VAGINAL HYSTERECTOMY      Medical History: Past Medical History:  Diagnosis Date   Allergy    Anxiety    Depression    Gout 2020   none since then   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 in 62s and a second time in his 63s   Brain cancer Brother 81   Breast cancer Paternal Aunt        dx. 60s   Non-Hodgkin's lymphoma Paternal Aunt        dx. 60s   Thyroid cancer Paternal Aunt        dx. 50s   Melanoma Paternal Aunt        dx. 30s   Pancreatic cancer Paternal Uncle 21   Gastric cancer Paternal Uncle 5   Prostate cancer Paternal Uncle 64   Breast cancer Maternal Grandmother    Lung cancer Paternal Grandmother        smoked, dx. 74s   Lung cancer Paternal Grandfather        smoked, dx. 36s   Breast cancer Cousin        pat cousin, dx. 62s    Social History   Socioeconomic History   Marital status: Married    Spouse name: Not on file   Number of children: Not on file   Years of education: Not on file   Highest education level: Not on file  Occupational History   Not on file  Tobacco Use   Smoking status: Never    Passive exposure: Past   Smokeless tobacco: Never  Vaping Use   Vaping status: Never Used  Substance and Sexual Activity   Alcohol use: Yes    Comment: ocassionally   Drug use: Never   Sexual activity: Not on file  Other Topics Concern   Not on file  Social History Narrative   Not on file   Social Drivers of Health   Financial Resource Strain: Low Risk  (05/31/2023)   Received from [facility]   Overall Financial Resource Strain (CARDIA)    Difficulty of Paying Living Expenses: Not hard at all  Food Insecurity: No Food Insecurity (05/31/2023)   Received from [facility]   Hunger Vital Sign    Worried About Running Out of Food in the Last Year: Never true    Ran Out of Food in the Last Year: Never true  Transportation Needs: No Transportation Needs (05/31/2023)   Received from [facility] - Transportation    In the past 12 months, has lack of transportation kept you from medical appointments or from getting medications?: No    Lack of Transportation (Non-Medical): No   Physical Activity: Not on file  Stress: Not on file  Social Connections: Not on file  Intimate Partner Violence: Not on file      Review of Systems  Constitutional:  Positive for appetite change, chills, fatigue and fever.  HENT:  Positive for congestion, postnasal drip, rhinorrhea, sinus pressure, sinus pain, sneezing and sore throat.  Respiratory:  Positive for cough, chest tightness and shortness of breath. Negative for wheezing.   Cardiovascular: Negative.  Negative for chest pain and palpitations.  Gastrointestinal: Negative.   Musculoskeletal: Negative.   Neurological:  Positive for headaches.    Vital Signs: BP 136/88   Pulse 95   Temp 97.6 F (36.4 C)   Resp 16   Ht 5\' 3"  (1.6 m)   Wt 145 lb 6.4 oz (66 kg)   SpO2 96%   BMI 25.76 kg/m    Physical Exam Vitals reviewed.  Constitutional:      General: She is not in acute distress.    Appearance: Normal appearance. She is not ill-appearing.  HENT:     Head: Normocephalic and atraumatic.     Right Ear: Tympanic membrane, ear canal and external ear normal.     Left Ear: Tympanic membrane, ear canal and external ear normal.     Nose: Congestion and rhinorrhea present.     Mouth/Throat:     Mouth: Mucous membranes are moist.     Pharynx: Posterior oropharyngeal erythema present.  Eyes:     Pupils: Pupils are equal, round, and reactive to light.  Cardiovascular:     Rate and Rhythm: Normal rate and regular rhythm.     Heart sounds: Normal heart sounds. No murmur heard. Pulmonary:     Effort: No respiratory distress.     Breath sounds: Normal breath sounds.  Skin:    General: Skin is warm and dry.     Capillary Refill: Capillary refill takes less than 2 seconds.  Neurological:     Mental Status: She is alert and oriented to person, place, and time.  Psychiatric:        Mood and Affect: Mood normal.        Behavior: Behavior normal.        Assessment/Plan: 1. Upper respiratory tract infection,  unspecified type (Primary) Zpak prescribed, take until gone  - azithromycin (ZITHROMAX) 250 MG tablet; Take 2 tablets on day 1, then 1 tablet daily on days 2 through 5  Dispense: 6 tablet; Refill: 0  2. Overweight with body mass index (BMI) of 27 to 27.9 in adult Last 6 lbs since last visit. Continue phentermine as prescribed. Follow up in 3 months  - phentermine (ADIPEX-P) 37.5 MG tablet; Take 1 tablet (37.5 mg total) by mouth daily before breakfast.  Dispense: 30 tablet; Refill: 1  3. Primary insomnia Continue ambien as prescribed. Follow up in 3 months for additional refills  - zolpidem (AMBIEN) 10 MG tablet; Take 1 tablet (10 mg total) by mouth at bedtime as needed for sleep. for sleep  Dispense: 30 tablet; Refill: 0   General Counseling: Emersyn verbalizes understanding of the findings of todays visit and agrees with plan of treatment. I have discussed any further diagnostic evaluation that may be needed or ordered today. We also reviewed her medications today. she has been encouraged to call the office with any questions or concerns that should arise related to todays visit.    No orders of the defined types were placed in this encounter.   Meds ordered this encounter  Medications   zolpidem (AMBIEN) 10 MG tablet    Sig: Take 1 tablet (10 mg total) by mouth at bedtime as needed for sleep. for sleep    Dispense:  30 tablet    Refill:  0    For future refills, please keep on file with her other refills   phentermine (ADIPEX-P) 37.5  MG tablet    Sig: Take 1 tablet (37.5 mg total) by mouth daily before breakfast.    Dispense:  30 tablet    Refill:  1    For future refill   azithromycin (ZITHROMAX) 250 MG tablet    Sig: Take 2 tablets on day 1, then 1 tablet daily on days 2 through 5    Dispense:  6 tablet    Refill:  0    Fill new script today    Return in about 3 months (around 10/12/2023) for F/U, Claud Gowan PCP ambien refills. .   Total time spent:30 Minutes Time spent  includes review of chart, medications, test results, and follow up plan with the patient.   [place] Controlled Substance Database was reviewed by me.  This patient was seen by Sallyanne Kuster, FNP-C in collaboration with Dr. Beverely Risen as a part of collaborative care agreement.   Glennon Kopko R. Tedd Sias, MSN, FNP-C Internal medicine

## 2023-08-31 ENCOUNTER — Other Ambulatory Visit: Payer: Self-pay | Admitting: Nurse Practitioner

## 2023-08-31 DIAGNOSIS — F418 Other specified anxiety disorders: Secondary | ICD-10-CM

## 2023-10-10 ENCOUNTER — Encounter: Payer: Self-pay | Admitting: Nurse Practitioner

## 2023-10-10 ENCOUNTER — Ambulatory Visit (INDEPENDENT_AMBULATORY_CARE_PROVIDER_SITE_OTHER): Payer: 59 | Admitting: Nurse Practitioner

## 2023-10-10 VITALS — BP 120/74 | HR 86 | Temp 98.2°F | Resp 16 | Ht 63.0 in | Wt 150.0 lb

## 2023-10-10 DIAGNOSIS — E538 Deficiency of other specified B group vitamins: Secondary | ICD-10-CM

## 2023-10-10 DIAGNOSIS — M064 Inflammatory polyarthropathy: Secondary | ICD-10-CM | POA: Diagnosis not present

## 2023-10-10 DIAGNOSIS — E782 Mixed hyperlipidemia: Secondary | ICD-10-CM

## 2023-10-10 DIAGNOSIS — I10 Essential (primary) hypertension: Secondary | ICD-10-CM | POA: Diagnosis not present

## 2023-10-10 DIAGNOSIS — Z6827 Body mass index (BMI) 27.0-27.9, adult: Secondary | ICD-10-CM

## 2023-10-10 DIAGNOSIS — M6281 Muscle weakness (generalized): Secondary | ICD-10-CM | POA: Diagnosis not present

## 2023-10-10 DIAGNOSIS — R5383 Other fatigue: Secondary | ICD-10-CM | POA: Diagnosis not present

## 2023-10-10 DIAGNOSIS — E663 Overweight: Secondary | ICD-10-CM

## 2023-10-10 DIAGNOSIS — Z808 Family history of malignant neoplasm of other organs or systems: Secondary | ICD-10-CM

## 2023-10-10 DIAGNOSIS — E559 Vitamin D deficiency, unspecified: Secondary | ICD-10-CM

## 2023-10-10 DIAGNOSIS — F418 Other specified anxiety disorders: Secondary | ICD-10-CM

## 2023-10-10 DIAGNOSIS — F5101 Primary insomnia: Secondary | ICD-10-CM

## 2023-10-10 MED ORDER — AMLODIPINE BESYLATE 10 MG PO TABS: 10.0000 mg | ORAL_TABLET | Freq: Every day | ORAL | 3 refills | Status: AC

## 2023-10-10 MED ORDER — PHENTERMINE HCL 37.5 MG PO TABS: 37.5000 mg | ORAL_TABLET | Freq: Every day | ORAL | 2 refills | Status: DC

## 2023-10-10 MED ORDER — AUVELITY 45-105 MG PO TBCR: 1.0000 | EXTENDED_RELEASE_TABLET | Freq: Two times a day (BID) | ORAL | 3 refills | Status: DC

## 2023-10-10 MED ORDER — ZOLPIDEM TARTRATE 10 MG PO TABS: 10.0000 mg | ORAL_TABLET | Freq: Every evening | ORAL | 2 refills | Status: DC | PRN

## 2023-10-10 NOTE — Progress Notes (Signed)
 [facility] [address] [place], [place] [address]  Internal MEDICINE  Office Visit Note  Patient Name: Lisa Osborn  [id number]  [id number]  Date of Service: 10/10/2023  Chief Complaint  Patient presents with   Depression   Hypertension   Hyperlipidemia   Follow-up    HPI Lisa Osborn presents for a follow-up visit for foot pain, fatigue, weakness, arthritis, labs.  Chronic right foot issues -- sees podiatry but has not been improving much per patient. Has special shoes, has received cortisone injections, etc.  Increased fatigue, arthritis and muscle weakness -- she attributes this to not being able to be more active due to her chronic foot pain.  Per patient -- her daughter has thyroid cancer and is having her thyroid gland removed. She is now worried about her levels and wants to have them checked.  Insomnia -- taking ambien  for sleep. This remains effective, due for refills today.  Depression and anxiety -- continues to take auvelity  which is helping.  Hypertension -- controlled with amlodipine .  Weight loss -- gained 5 lbs. Ran out of phentermine  a little over a week ago.    Current Medication: Outpatient Encounter Medications as of 10/10/2023  Medication Sig Note   EPINEPHrine 0.3 mg/0.3 mL IJ SOAJ injection Inject 0.3 mg into the muscle as needed for anaphylaxis. 03/23/2021: Prn    estradiol  (CLIMARA  - DOSED IN MG/24 HR) 0.05 mg/24hr patch PLACE 1 PATCH (0.05 MG TOTAL) ONTO THE SKIN ONCE A WEEK.    hydrochlorothiazide  (HYDRODIURIL ) 12.5 MG tablet Take 1 tablet (12.5 mg total) by mouth daily.    ibuprofen  (ADVIL ) 800 MG tablet Take 1 tablet (800 mg total) by mouth every 8 (eight) hours as needed for moderate pain. For AFTER surgery only 03/23/2021: Prn    ondansetron  (ZOFRAN -ODT) 4 MG disintegrating tablet Take 4 mg by mouth every 8 (eight) hours as needed.    [DISCONTINUED] amLODipine  (NORVASC ) 10 MG tablet Take 1 tablet (10 mg total) by mouth daily.     [DISCONTINUED] AUVELITY  45-105 MG TBCR TAKE ONE TABLET BY MOUTH TWICE DAILY    [DISCONTINUED] phentermine  (ADIPEX-P ) 37.5 MG tablet Take 1 tablet (37.5 mg total) by mouth daily before breakfast.    [DISCONTINUED] zolpidem  (AMBIEN ) 10 MG tablet Take 1 tablet (10 mg total) by mouth at bedtime as needed for sleep. for sleep    amLODipine  (NORVASC ) 10 MG tablet Take 1 tablet (10 mg total) by mouth daily.    Dextromethorphan-buPROPion ER (AUVELITY ) 45-105 MG TBCR Take 1 tablet by mouth 2 (two) times daily.    phentermine  (ADIPEX-P ) 37.5 MG tablet Take 1 tablet (37.5 mg total) by mouth daily before breakfast.    zolpidem  (AMBIEN ) 10 MG tablet Take 1 tablet (10 mg total) by mouth at bedtime as needed for sleep. for sleep    No facility-administered encounter medications on file as of 10/10/2023.    Surgical History: Past Surgical History:  Procedure Laterality Date   CESAREAN SECTION     CHOLECYSTECTOMY     COLONOSCOPY WITH PROPOFOL  N/A 02/16/2021   Procedure: COLONOSCOPY WITH PROPOFOL ;  Surgeon: Selena Daily, MD;  Location: [facility] ENDOSCOPY;  Service: Gastroenterology;  Laterality: N/A;   NOSE SURGERY     Surgery for deviated septum   OTHER SURGICAL HISTORY     Mesh placement for bladder prolapse versus urinary incontinence   ROBOTIC ASSISTED BILATERAL SALPINGO OOPHERECTOMY N/A 03/03/2021   Procedure: XI ROBOTIC ASSISTED BILATERAL SALPINGO OOPHORECTOMY,MINI LAPAROTOMY AND OMENTECTOMY, VAGINAL MASS EXCISION;  Surgeon:  Suzi Essex, MD;  Location: [facility]S;  Service: Gynecology;  Laterality: N/A;   VAGINAL HYSTERECTOMY      Medical History: Past Medical History:  Diagnosis Date   Allergy    Anxiety    Depression    Gout 2020   none since then   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 in 22s and a second time in his 50s   Brain cancer Brother 97   Breast cancer Paternal Aunt        dx. 60s   Non-Hodgkin's lymphoma Paternal Aunt        dx. 60s   Thyroid cancer Paternal Aunt        dx. 50s   Melanoma Paternal Aunt        dx. 74s   Pancreatic cancer Paternal Uncle 3   Gastric cancer Paternal Uncle 33   Prostate cancer Paternal Uncle 31   Breast cancer Maternal Grandmother    Lung cancer Paternal Grandmother        smoked, dx. 91s   Lung cancer Paternal Grandfather        smoked, dx. 82s   Breast cancer Cousin        pat cousin, dx. 77s    Social History   Socioeconomic History   Marital status: Married    Spouse name: Not on file   Number of children: Not on file   Years of education: Not on file   Highest education level: Not on file  Occupational History   Not on file  Tobacco Use   Smoking status: Never    Passive exposure: Past   Smokeless tobacco: Never  Vaping Use   Vaping status: Never Used  Substance and Sexual Activity   Alcohol use: Yes    Comment: ocassionally   Drug use: Never   Sexual activity: Not on file  Other Topics Concern   Not on file  Social History Narrative   Not on file   Social Drivers of Health   Financial Resource Strain: Low Risk  (05/31/2023)   Received from [facility]   Overall Financial Resource Strain (CARDIA)    Difficulty of Paying Living Expenses: Not hard at all  Food Insecurity: No Food Insecurity (05/31/2023)   Received from [facility]   Hunger Vital Sign    Worried About Running Out of Food in the Last Year: Never true    Ran Out of Food in the Last Year: Never true  Transportation Needs: No Transportation Needs (05/31/2023)   Received from [facility] - Transportation    In the past 12 months, has lack of transportation kept you from medical appointments or  from getting medications?: No    Lack of Transportation (Non-Medical): No  Physical Activity: Not on file  Stress: Not on file  Social Connections: Not on file  Intimate Partner Violence: Not on file      Review of Systems  Constitutional:  Positive for activity change, diaphoresis (night sweats), fatigue and unexpected weight change. Negative for chills.  HENT:  Positive for postnasal drip. Negative for  congestion, rhinorrhea, sneezing and sore throat.   Eyes:  Negative for redness.  Respiratory: Negative.  Negative for cough, chest tightness, shortness of breath and wheezing.   Cardiovascular: Negative.  Negative for chest pain and palpitations.  Gastrointestinal:  Negative for abdominal pain, constipation, diarrhea, nausea and vomiting.  Endocrine: Positive for heat intolerance (hot flashes).  Genitourinary: Negative.  Negative for dysuria and frequency.  Musculoskeletal: Negative.  Negative for arthralgias, back pain, joint swelling and neck pain.  Skin:  Negative for rash.  Neurological:  Positive for weakness. Negative for tremors and numbness.  Hematological:  Negative for adenopathy. Does not bruise/bleed easily.  Psychiatric/Behavioral:  Positive for behavioral problems (Depression), depression, dysphoric mood and sleep disturbance. Negative for self-injury and suicidal ideas. The patient is nervous/anxious.     Vital Signs: BP 120/74   Pulse 86   Temp 98.2 F (36.8 C)   Resp 16   Ht 5\' 3"  (1.6 m)   Wt 150 lb (68 kg)   SpO2 97%   BMI 26.57 kg/m    Physical Exam Vitals reviewed.  Constitutional:      General: She is not in acute distress.    Appearance: Normal appearance. She is well-developed, well-groomed and overweight. She is not ill-appearing.  HENT:     Head: Normocephalic and atraumatic.  Eyes:     Pupils: Pupils are equal, round, and reactive to light.  Cardiovascular:     Rate and Rhythm: Normal rate and regular rhythm.  Pulmonary:     Effort:  Pulmonary effort is normal. No respiratory distress.  Neurological:     Mental Status: She is alert and oriented to person, place, and time.  Psychiatric:        Attention and Perception: Attention normal.        Mood and Affect: Mood and affect normal.        Speech: Speech normal.        Behavior: Behavior normal. Behavior is cooperative.        Thought Content: Thought content normal.        Judgment: Judgment normal.        Assessment/Plan: 1. Other fatigue (Primary) Routine and additional labs ordered for further evaluation.  - CBC with Differential/Platelet - CMP14+EGFR - Lipid Profile - B12 and Folate Panel - TSH+T4F+T3Free+ThyAbs+TPO+VD25 - Sed Rate (ESR) - C-reactive protein - Rheumatoid Factor - ANA Direct w/Reflex if Positive - Iron, TIBC and Ferritin Panel  2. Inflammatory polyarthritis (HCC) Routine and additional labs ordered for further evaluation  - CBC with Differential/Platelet - CMP14+EGFR - Lipid Profile - B12 and Folate Panel - TSH+T4F+T3Free+ThyAbs+TPO+VD25 - Sed Rate (ESR) - C-reactive protein - Rheumatoid Factor - ANA Direct w/Reflex if Positive - Iron, TIBC and Ferritin Panel  3. Muscle weakness Routine and additional labs ordered for further evaluation  - CBC with Differential/Platelet - CMP14+EGFR - Lipid Profile - B12 and Folate Panel - TSH+T4F+T3Free+ThyAbs+TPO+VD25 - Sed Rate (ESR) - C-reactive protein - Rheumatoid Factor - ANA Direct w/Reflex if Positive - Iron, TIBC and Ferritin Panel  4. Essential hypertension Stable, continue amlodipine  and hydrochlorothiazide  as prescribed. Additional and routine labs ordered  - amLODipine  (NORVASC ) 10 MG tablet; Take 1 tablet (10 mg total) by mouth daily.  Dispense: 90 tablet; Refill: 3 - CBC with Differential/Platelet - CMP14+EGFR - Lipid Profile - B12 and Folate Panel - TSH+T4F+T3Free+ThyAbs+TPO+VD25 - Sed Rate (ESR) - C-reactive protein - Rheumatoid Factor - ANA Direct w/Reflex  if Positive - Iron, TIBC and Ferritin Panel  5. Mixed hyperlipidemia Routine and additional labs ordered  - CBC with Differential/Platelet - CMP14+EGFR - Lipid Profile - B12 and Folate Panel - TSH+T4F+T3Free+ThyAbs+TPO+VD25 - Sed Rate (ESR) - C-reactive protein - Rheumatoid Factor - ANA Direct w/Reflex if Positive - Iron, TIBC and Ferritin Panel  6. B12 deficiency Routine and additional labs ordered  - CBC with Differential/Platelet - CMP14+EGFR - Lipid Profile - B12 and Folate Panel - TSH+T4F+T3Free+ThyAbs+TPO+VD25 - Sed Rate (ESR) - C-reactive protein - Rheumatoid Factor - ANA Direct w/Reflex if Positive - Iron, TIBC and Ferritin Panel  7. Vitamin D deficiency Routine and additional labs ordered  - CBC with Differential/Platelet - CMP14+EGFR - Lipid Profile - B12 and Folate Panel - TSH+T4F+T3Free+ThyAbs+TPO+VD25 - Sed Rate (ESR) - C-reactive protein - Rheumatoid Factor - ANA Direct w/Reflex if Positive - Iron, TIBC and Ferritin Panel  8. Overweight with body mass index (BMI) of 27 to 27.9 in adult Continue phentermine  as prescribed.  - phentermine  (ADIPEX-P ) 37.5 MG tablet; Take 1 tablet (37.5 mg total) by mouth daily before breakfast.  Dispense: 30 tablet; Refill: 2  9. Depression with anxiety Continue auvelity  as prescribed.  - Dextromethorphan-buPROPion ER (AUVELITY ) 45-105 MG TBCR; Take 1 tablet by mouth 2 (two) times daily.  Dispense: 60 tablet; Refill: 3  10. Primary insomnia Continue ambien  as prescribed. Follow up in 3 months for additional refills. Routine and additional labs ordered  - zolpidem  (AMBIEN ) 10 MG tablet; Take 1 tablet (10 mg total) by mouth at bedtime as needed for sleep. for sleep  Dispense: 30 tablet; Refill: 2 - CBC with Differential/Platelet - CMP14+EGFR - Lipid Profile - B12 and Folate Panel - TSH+T4F+T3Free+ThyAbs+TPO+VD25 - Sed Rate (ESR) - C-reactive protein - Rheumatoid Factor - ANA Direct w/Reflex if Positive - Iron,  TIBC and Ferritin Panel  11. Family history of thyroid cancer Thyroid labs ordered    General Counseling: ellasyn swilling understanding of the findings of todays visit and agrees with plan of treatment. I have discussed any further diagnostic evaluation that may be needed or ordered today. We also reviewed her medications today. she has been encouraged to call the office with any questions or concerns that should arise related to todays visit.    Orders Placed This Encounter  Procedures   CBC with Differential/Platelet   CMP14+EGFR   Lipid Profile   B12 and Folate Panel   TSH+T4F+T3Free+ThyAbs+TPO+VD25   Sed Rate (ESR)   C-reactive protein   Rheumatoid Factor   ANA Direct w/Reflex if Positive   Iron, TIBC and Ferritin Panel    Meds ordered this encounter  Medications   phentermine  (ADIPEX-P ) 37.5 MG tablet    Sig: Take 1 tablet (37.5 mg total) by mouth daily before breakfast.    Dispense:  30 tablet    Refill:  2    For future refill   zolpidem  (AMBIEN ) 10 MG tablet    Sig: Take 1 tablet (10 mg total) by mouth at bedtime as needed for sleep. for sleep    Dispense:  30 tablet    Refill:  2    For future refills, please keep on file with her other refills   amLODipine  (NORVASC ) 10 MG tablet    Sig: Take 1 tablet (10 mg total) by mouth daily.    Dispense:  90 tablet    Refill:  3    For future refills   Dextromethorphan-buPROPion ER (AUVELITY ) 45-105 MG TBCR    Sig: Take 1 tablet by mouth 2 (two) times daily.  Dispense:  60 tablet    Refill:  3    Return for F/U, Labs, Ambrosio Reuter PCP in 3-4 weeks. .   Total time spent:30 Minutes Time spent includes review of chart, medications, test results, and follow up plan with the patient.   [place] Controlled Substance Database was reviewed by me.  This patient was seen by Laurence Pons, FNP-C in collaboration with Dr. Verneta Gone as a part of collaborative care agreement.   Trampas Stettner R. Bobbi Burow, MSN, FNP-C Internal medicine

## 2023-10-12 LAB — CBC WITH DIFFERENTIAL/PLATELET
Basophils Absolute: 0.1 10*3/uL (ref 0.0–0.2)
Basos: 2 %
EOS (ABSOLUTE): 0.3 10*3/uL (ref 0.0–0.4)
Eos: 5 %
Hematocrit: 41.5 % (ref 34.0–46.6)
Hemoglobin: 13.6 g/dL (ref 11.1–15.9)
Immature Grans (Abs): 0 10*3/uL (ref 0.0–0.1)
Immature Granulocytes: 1 %
Lymphocytes Absolute: 1.8 10*3/uL (ref 0.7–3.1)
Lymphs: 34 %
MCH: 29.1 pg (ref 26.6–33.0)
MCHC: 32.8 g/dL (ref 31.5–35.7)
MCV: 89 fL (ref 79–97)
Monocytes Absolute: 0.6 10*3/uL (ref 0.1–0.9)
Monocytes: 11 %
Neutrophils Absolute: 2.5 10*3/uL (ref 1.4–7.0)
Neutrophils: 47 %
Platelets: 331 10*3/uL (ref 150–450)
RBC: 4.68 x10E6/uL (ref 3.77–5.28)
RDW: 13.2 % (ref 11.7–15.4)
WBC: 5.2 10*3/uL (ref 3.4–10.8)

## 2023-10-12 LAB — LIPID PANEL
Chol/HDL Ratio: 3.4 ratio (ref 0.0–4.4)
Cholesterol, Total: 232 mg/dL — ABNORMAL HIGH (ref 100–199)
HDL: 68 mg/dL (ref >39)
LDL Chol Calc (NIH): 148 mg/dL — ABNORMAL HIGH (ref 0–99)
Triglycerides: 93 mg/dL (ref 0–149)
VLDL Cholesterol Cal: 16 mg/dL (ref 5–40)

## 2023-10-12 LAB — TSH+T4F+T3FREE+THYABS+TPO+VD25
Free T4: 0.67 ng/dL — ABNORMAL LOW (ref 0.82–1.77)
T3, Free: 3.9 pg/mL (ref 2.0–4.4)
TSH: 0.536 u[IU]/mL (ref 0.450–4.500)
Thyroglobulin Antibody: <1 [IU]/mL (ref 0.0–0.9)
Thyroperoxidase Ab SerPl-aCnc: <9 [IU]/mL (ref 0–34)
Vit D, 25-Hydroxy: 28.7 ng/mL — ABNORMAL LOW (ref 30.0–100.0)

## 2023-10-12 LAB — CMP14+EGFR
ALT: 28 IU/L (ref 0–32)
AST: 28 IU/L (ref 0–40)
Albumin: 4.3 g/dL (ref 3.8–4.9)
Alkaline Phosphatase: 104 IU/L (ref 44–121)
BUN/Creatinine Ratio: 27 — ABNORMAL HIGH (ref 9–23)
BUN: 15 mg/dL (ref 6–24)
Bilirubin Total: 0.6 mg/dL (ref 0.0–1.2)
CO2: 23 mmol/L (ref 20–29)
Calcium: 10 mg/dL (ref 8.7–10.2)
Chloride: 103 mmol/L (ref 96–106)
Creatinine, Ser: 0.56 mg/dL — ABNORMAL LOW (ref 0.57–1.00)
Globulin, Total: 2.6 g/dL (ref 1.5–4.5)
Glucose: 104 mg/dL — ABNORMAL HIGH (ref 70–99)
Potassium: 4.1 mmol/L (ref 3.5–5.2)
Sodium: 139 mmol/L (ref 134–144)
Total Protein: 6.9 g/dL (ref 6.0–8.5)
eGFR: 106 mL/min/{1.73_m2} (ref >59)

## 2023-10-12 LAB — ANA W/REFLEX IF POSITIVE: Anti Nuclear Antibody (ANA): NEGATIVE

## 2023-10-12 LAB — B12 AND FOLATE PANEL
Folate: 15.6 ng/mL (ref >3.0)
Vitamin B-12: 368 pg/mL (ref 232–1245)

## 2023-10-12 LAB — C-REACTIVE PROTEIN: CRP: <1 mg/L (ref 0–10)

## 2023-10-12 LAB — IRON,TIBC AND FERRITIN PANEL
Ferritin: 55 ng/mL (ref 15–150)
Iron Saturation: 26 % (ref 15–55)
Iron: 106 ug/dL (ref 27–159)
Total Iron Binding Capacity: 406 ug/dL (ref 250–450)
UIBC: 300 ug/dL (ref 131–425)

## 2023-10-12 LAB — SEDIMENTATION RATE: Sed Rate: 13 mm/h (ref 0–40)

## 2023-10-12 LAB — RHEUMATOID FACTOR: Rheumatoid fact SerPl-aCnc: 10.8 [IU]/mL (ref <14.0)

## 2023-10-18 ENCOUNTER — Encounter: Payer: Self-pay | Admitting: Nurse Practitioner

## 2023-10-31 ENCOUNTER — Ambulatory Visit: Admitting: Nurse Practitioner

## 2023-10-31 ENCOUNTER — Encounter: Payer: Self-pay | Admitting: Nurse Practitioner

## 2023-10-31 VITALS — BP 136/78 | HR 93 | Temp 98.1°F | Resp 16 | Ht 63.0 in | Wt 151.0 lb

## 2023-10-31 DIAGNOSIS — E559 Vitamin D deficiency, unspecified: Secondary | ICD-10-CM | POA: Diagnosis not present

## 2023-10-31 DIAGNOSIS — R946 Abnormal results of thyroid function studies: Secondary | ICD-10-CM

## 2023-10-31 DIAGNOSIS — Z808 Family history of malignant neoplasm of other organs or systems: Secondary | ICD-10-CM

## 2023-10-31 DIAGNOSIS — N390 Urinary tract infection, site not specified: Secondary | ICD-10-CM | POA: Diagnosis not present

## 2023-10-31 DIAGNOSIS — E782 Mixed hyperlipidemia: Secondary | ICD-10-CM | POA: Diagnosis not present

## 2023-10-31 DIAGNOSIS — R300 Dysuria: Secondary | ICD-10-CM

## 2023-10-31 MED ORDER — PHENAZOPYRIDINE HCL 200 MG PO TABS: 200.0000 mg | ORAL_TABLET | Freq: Three times a day (TID) | ORAL | 0 refills | Status: DC | PRN

## 2023-10-31 MED ORDER — NITROFURANTOIN MACROCRYSTAL 100 MG PO CAPS: 100.0000 mg | ORAL_CAPSULE | Freq: Two times a day (BID) | ORAL | 0 refills | Status: AC

## 2023-10-31 NOTE — Patient Instructions (Signed)
 Can try chromium picolinate 1000 mcg daily  And can try OTC berberine supplement  Try flaxseed oil supplement for cholesterol 1000 mg daily.   Increase vitamin D to 5000 units daily.

## 2023-10-31 NOTE — Progress Notes (Signed)
 [facility] [address] [place], [place] [address]  Internal MEDICINE  Office Visit Note  Patient Name: Lisa Osborn  [id number]  [id number]  Date of Service: 10/31/2023  Chief Complaint  Patient presents with   Follow-up    Labs review    HPI Lisa Osborn presents for a follow-up visit for lab results and possible UTI Autoimmune labs were normal.  Low free T4 and borderline low TSH Low vitamin D  Possible UTI -- urinary discomfort, urgency, frequency, pelvic pressure, back pain, burning with urination, has been taking AZO, took 3 days of leftover macrobid .  Elevated LDL level Impaired fasting glucose --104   Current Medication: Outpatient Encounter Medications as of 10/31/2023  Medication Sig Note   amLODipine  (NORVASC ) 10 MG tablet Take 1 tablet (10 mg total) by mouth daily.    Dextromethorphan-buPROPion ER (AUVELITY ) 45-105 MG TBCR Take 1 tablet by mouth 2 (two) times daily.    EPINEPHrine 0.3 mg/0.3 mL IJ SOAJ injection Inject 0.3 mg into the muscle as needed for anaphylaxis. 03/23/2021: Prn    estradiol  (CLIMARA  - DOSED IN MG/24 HR) 0.05 mg/24hr patch PLACE 1 PATCH (0.05 MG TOTAL) ONTO THE SKIN ONCE A WEEK.    hydrochlorothiazide  (HYDRODIURIL ) 12.5 MG tablet Take 1 tablet (12.5 mg total) by mouth daily.    ibuprofen  (ADVIL ) 800 MG tablet Take 1 tablet (800 mg total) by mouth every 8 (eight) hours as needed for moderate pain. For AFTER surgery only 03/23/2021: Prn    nitrofurantoin  (MACRODANTIN ) 100 MG capsule Take 1 capsule (100 mg total) by mouth 2 (two) times daily for 7 days. Take with food    ondansetron  (ZOFRAN -ODT) 4 MG disintegrating tablet Take 4 mg by mouth every 8 (eight) hours as needed.    phenazopyridine  (PYRIDIUM ) 200 MG tablet Take 1 tablet (200 mg total) by mouth 3 (three) times daily as needed for pain.    phentermine  (ADIPEX-P ) 37.5 MG tablet Take 1 tablet (37.5 mg total) by mouth daily before breakfast.    zolpidem  (AMBIEN ) 10 MG tablet Take 1  tablet (10 mg total) by mouth at bedtime as needed for sleep. for sleep    No facility-administered encounter medications on file as of 10/31/2023.    Surgical History: Past Surgical History:  Procedure Laterality Date   CESAREAN SECTION     CHOLECYSTECTOMY     COLONOSCOPY WITH PROPOFOL  N/A 02/16/2021   Procedure: COLONOSCOPY WITH PROPOFOL ;  Surgeon: Selena Daily, MD;  Location: [facility] ENDOSCOPY;  Service: Gastroenterology;  Laterality: N/A;   NOSE SURGERY     Surgery for deviated septum   OTHER SURGICAL HISTORY     Mesh placement for bladder prolapse versus urinary incontinence   ROBOTIC ASSISTED BILATERAL SALPINGO OOPHERECTOMY N/A 03/03/2021   Procedure: XI ROBOTIC ASSISTED BILATERAL SALPINGO OOPHORECTOMY,MINI LAPAROTOMY AND OMENTECTOMY, VAGINAL MASS EXCISION;  Surgeon: Suzi Essex, MD;  Location: [facility];  Service: Gynecology;  Laterality: N/A;   VAGINAL HYSTERECTOMY      Medical History: Past Medical History:  Diagnosis Date   Allergy    Anxiety    Depression    Gout 2020   none since then   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 in 48s and a second time in his 35s   Brain cancer Brother 78   Breast cancer Paternal Aunt        dx. 60s   Non-Hodgkin's lymphoma Paternal Aunt        dx. 19s   Thyroid  cancer Paternal Aunt        dx. 50s   Melanoma Paternal Aunt        dx. 17s   Pancreatic cancer Paternal Uncle 80   Gastric cancer Paternal Uncle 54   Prostate cancer Paternal Uncle 3   Breast cancer Maternal Grandmother    Lung cancer Paternal Grandmother        smoked, dx. 42s   Lung cancer Paternal Grandfather        smoked, dx. 61s   Breast cancer Cousin        pat cousin, dx. 27s     Social History   Socioeconomic History   Marital status: Married    Spouse name: Not on file   Number of children: Not on file   Years of education: Not on file   Highest education level: Not on file  Occupational History   Not on file  Tobacco Use   Smoking status: Never    Passive exposure: Past   Smokeless tobacco: Never  Vaping Use   Vaping status: Never Used  Substance and Sexual Activity   Alcohol use: Yes    Comment: ocassionally   Drug use: Never   Sexual activity: Not on file  Other Topics Concern   Not on file  Social History Narrative   Not on file   Social Drivers of Health   Financial Resource Strain: Low Risk  (05/31/2023)   Received [facility]   Overall Financial Resource Strain (CARDIA)    Difficulty of Paying Living Expenses: Not hard at all  Food Insecurity: No Food Insecurity (05/31/2023)   Received from [facility]   Hunger Vital Sign    Worried About Running Out of Food in the Last Year: Never true    Ran Out of Food in the Last Year: Never true  Transportation Needs: No Transportation Needs (05/31/2023)   Received from [facility] - Transportation    In the past 12 months, has lack of transportation kept you from medical appointments or from getting medications?: No    Lack of Transportation (Non-Medical): No  Physical Activity: Not on file  Stress: Not on file  Social Connections: Not on file  Intimate Partner Violence: Not on file      Review of Systems  Constitutional:  Positive for fatigue.  Respiratory: Negative.  Negative for cough, chest tightness, shortness of breath and wheezing.   Cardiovascular: Negative.  Negative for chest pain and palpitations.  Gastrointestinal: Negative.   Genitourinary:  Positive for dysuria, frequency and urgency.  Musculoskeletal:  Positive for arthralgias and back pain.  Neurological: Negative.     Vital Signs: BP 136/78   Pulse  93   Temp 98.1 F (36.7 C)   Resp 16   Ht 5' 3 (1.6 m)   Wt 151 lb (68.5 kg)   SpO2 97%   BMI 26.75 kg/m    Physical Exam Vitals reviewed.  Constitutional:      General: She is not in acute distress.    Appearance: Normal appearance. She is well-developed, well-groomed and overweight. She is not ill-appearing.  HENT:     Head: Normocephalic and atraumatic.  Eyes:     Pupils: Pupils are equal, round, and reactive to light.  Cardiovascular:     Rate and Rhythm: Normal rate and regular rhythm.  Pulmonary:     Effort: Pulmonary effort is normal. No respiratory distress.  Neurological:     Mental Status: She is alert and oriented to person, place, and time.  Psychiatric:        Attention and Perception: Attention normal.        Mood and Affect: Mood and affect normal.        Speech: Speech normal.        Behavior: Behavior normal. Behavior is cooperative.        Thought Content: Thought content normal.        Judgment: Judgment normal.        Assessment/Plan: 1. Abnormal thyroid  function test (Primary) Thyroid  ultrasound ordered.  - US  THYROID ; Future  2. Urinary tract infection without hematuria, site unspecified Macrobid  prescribed. Take until gone. Pyridium  prescribed as needed.  - nitrofurantoin  (MACRODANTIN ) 100 MG capsule; Take 1 capsule (100 mg total) by mouth 2 (two) times daily for 7 days. Take with food  Dispense: 14 capsule; Refill: 0 - phenazopyridine  (PYRIDIUM ) 200 MG tablet; Take 1 tablet (200 mg total) by mouth 3 (three) times daily as needed for pain.  Dispense: 15 tablet; Refill: 0  3. Mixed hyperlipidemia Encourage patient to limit red meat intake, and increase lean proteins in diet. Add fish oil or flaxseed oil supplement OTC.   4. Vitamin D deficiency Take OTC vitamin D supplement 2000 units daily.   5. Dysuria Macrobid  prescribed, take until gone. Urinalysis and culture sent. Pyridium  prescribed for dysuria  - nitrofurantoin  (MACRODANTIN ) 100  MG capsule; Take 1 capsule (100 mg total) by mouth 2 (two) times daily for 7 days. Take with food  Dispense: 14 capsule; Refill: 0 - phenazopyridine  (PYRIDIUM ) 200 MG tablet; Take 1 tablet (200 mg total) by mouth 3 (three) times daily as needed for pain.  Dispense: 15 tablet; Refill: 0 - UA/M w/rflx Culture, Routine  6. Family history of thyroid  cancer Thyroid  ultrasound ordered  - US  THYROID ; Future   General Counseling: Quenesha verbalizes understanding of the findings of todays visit and agrees with plan of treatment. I have discussed any further diagnostic evaluation that may be needed or ordered today. We also reviewed her medications today. she has been encouraged to call the office with any questions or concerns that should arise related to todays visit.    Orders Placed This Encounter  Procedures   US  THYROID    UA/M w/rflx Culture, Routine    Meds ordered this encounter  Medications   nitrofurantoin  (MACRODANTIN ) 100 MG capsule    Sig: Take 1 capsule (100 mg total) by mouth 2 (two) times daily for 7 days. Take with food    Dispense:  14 capsule    Refill:  0    Fill new script today   phenazopyridine  (PYRIDIUM ) 200 MG tablet    Sig: Take 1 tablet (200 mg total) by mouth 3 (three) times daily as needed for pain.    Dispense:  15 tablet    Refill:  0    Return in about 3 weeks (around 11/21/2023) for F/U, Ultrasound, N[place]iba Guallpa PCP thyroid .   Total time spent:30 Minutes Time spent includes review of chart, medications, test results, and follow up plan with the patient.   [place] Controlled Substance Database was reviewed  by me.  This patient was seen by Laurence Pons, FNP-C in collaboration with Dr. Verneta Gone as a part of collaborative care agreement.   Kimmerly Lora R. Bobbi Burow, MSN, FNP-C Internal medicine

## 2023-11-01 ENCOUNTER — Ambulatory Visit: Payer: Self-pay | Admitting: Nurse Practitioner

## 2023-11-01 ENCOUNTER — Ambulatory Visit
Admission: RE | Admit: 2023-11-01 | Discharge: 2023-11-01 | Disposition: A | Discharge status: Home / Self Care | Payer: Self-pay | Source: Ambulatory Visit | Attending: Nurse Practitioner | Admitting: Nurse Practitioner

## 2023-11-01 ENCOUNTER — Telehealth: Payer: Self-pay

## 2023-11-01 DIAGNOSIS — R946 Abnormal results of thyroid function studies: Secondary | ICD-10-CM

## 2023-11-01 DIAGNOSIS — Z808 Family history of malignant neoplasm of other organs or systems: Secondary | ICD-10-CM

## 2023-11-01 LAB — UA/M W/RFLX CULTURE, ROUTINE

## 2023-11-01 NOTE — Telephone Encounter (Signed)
 Called [facility] and add on urine culture

## 2023-11-02 ENCOUNTER — Telehealth: Payer: Self-pay | Admitting: Nurse Practitioner

## 2023-11-02 DIAGNOSIS — Z808 Family history of malignant neoplasm of other organs or systems: Secondary | ICD-10-CM

## 2023-11-02 DIAGNOSIS — E041 Nontoxic single thyroid nodule: Secondary | ICD-10-CM

## 2023-11-02 DIAGNOSIS — R9389 Abnormal findings on diagnostic imaging of other specified body structures: Secondary | ICD-10-CM

## 2023-11-02 NOTE — Telephone Encounter (Signed)
 Patient had thyroid ultrasound done. 1 nodule found on the mid right thyroid lobe that was Ti-RADs 4. FNA biopsy is recommended and patient is agreeable.   Will refer to ENT for biopsy.

## 2023-11-02 NOTE — Telephone Encounter (Signed)
 Otolaryngology appointment 11/03/2023 @ [facility]

## 2023-11-02 NOTE — Telephone Encounter (Signed)
 Urgent Otolaryngology referral sent via [employer] to Dr. Celso College w/ [facility].  Notified patient. Gave pt telephone# [phone]-Toni

## 2023-11-02 NOTE — Telephone Encounter (Signed)
 Left message for patient to discuss ultrasound results and next steps

## 2023-11-02 NOTE — Telephone Encounter (Signed)
 Received lab orders from [facility] for correction & signature. Gave to Alyssa-Toni

## 2023-11-03 ENCOUNTER — Other Ambulatory Visit: Payer: Self-pay | Admitting: Otolaryngology

## 2023-11-03 DIAGNOSIS — E041 Nontoxic single thyroid nodule: Secondary | ICD-10-CM

## 2023-11-03 LAB — SPECIMEN STATUS REPORT

## 2023-11-03 LAB — CULTURE, URINE COMPREHENSIVE

## 2023-11-04 ENCOUNTER — Encounter: Payer: Self-pay | Admitting: Nurse Practitioner

## 2023-11-08 NOTE — Progress Notes (Signed)
 Left VM for patient to arrive @ 2pm for 2:30 appointment. Left number for callback with any questions

## 2023-11-09 ENCOUNTER — Ambulatory Visit
Admission: RE | Admit: 2023-11-09 | Discharge: 2023-11-09 | Disposition: A | Discharge status: Home / Self Care | Source: Ambulatory Visit | Attending: Otolaryngology | Admitting: Otolaryngology

## 2023-11-09 VITALS — BP 143/87 | HR 100

## 2023-11-09 DIAGNOSIS — E041 Nontoxic single thyroid nodule: Secondary | ICD-10-CM | POA: Insufficient documentation

## 2023-11-09 MED ORDER — LIDOCAINE HCL (PF) 1 % IJ SOLN
10.0000 mL | Freq: Once | INTRAMUSCULAR | Status: AC
Start: 2023-11-09 — End: 2023-11-09
  Administered 2023-11-09: 10 mL via INTRADERMAL
  Filled 2023-11-09: qty 10

## 2023-11-09 NOTE — Procedures (Signed)
 PROCEDURE SUMMARY:  Using direct ultrasound guidance, 5 passes were made using 25 g needles into the nodule within the mid, right lobe of the thyroid .   Ultrasound was used to confirm needle placements on all occasions.   EBL = trace  Specimens were sent to Pathology for analysis.  See procedure note under Imaging tab in Epic for full procedure details.  Electronically Signed: Daryle Boyington M Daniqua Campoy, PA-C 11/09/2023, 4:00 PM

## 2023-11-10 LAB — CYTOLOGY - NON PAP

## 2023-11-21 ENCOUNTER — Encounter: Payer: Self-pay | Admitting: Nurse Practitioner

## 2023-11-21 ENCOUNTER — Ambulatory Visit (INDEPENDENT_AMBULATORY_CARE_PROVIDER_SITE_OTHER): Admitting: Nurse Practitioner

## 2023-11-21 VITALS — BP 134/86 | HR 99 | Temp 98.1°F | Resp 16 | Ht 63.0 in | Wt 148.2 lb

## 2023-11-21 DIAGNOSIS — E041 Nontoxic single thyroid nodule: Secondary | ICD-10-CM | POA: Diagnosis not present

## 2023-11-21 DIAGNOSIS — Z808 Family history of malignant neoplasm of other organs or systems: Secondary | ICD-10-CM

## 2023-11-21 DIAGNOSIS — Z1231 Encounter for screening mammogram for malignant neoplasm of breast: Secondary | ICD-10-CM

## 2023-11-21 DIAGNOSIS — F418 Other specified anxiety disorders: Secondary | ICD-10-CM | POA: Diagnosis not present

## 2023-11-21 MED ORDER — AUVELITY 45-105 MG PO TBCR: 1.0000 | EXTENDED_RELEASE_TABLET | Freq: Two times a day (BID) | ORAL | 3 refills | Status: DC

## 2023-11-21 NOTE — Progress Notes (Signed)
 [facility] [address] [place], [place] [address]  Internal MEDICINE  Office Visit Note  Patient Name: Lisa Osborn  [id number]  [id number]  Date of Service: 11/21/2023  Chief Complaint  Patient presents with   Depression   Hypertension   Hyperlipidemia   Follow-up    HPI Teshara presents for a follow-up visit for recent thyroid  biopsy, screenings and refills.  Thyroid  biopsy -- atypia of unknown significance. The biopsy was sent off for additional testing and she was told this would take a couple of weeks to get the results.  Due for routine mammogram UTI symptoms have resolved.  Depression with anxiety -- taking auvelity , no issues, due for refills.     Current Medication: Outpatient Encounter Medications as of 11/21/2023  Medication Sig Note   amLODipine  (NORVASC ) 10 MG tablet Take 1 tablet (10 mg total) by mouth daily.    Dextromethorphan-buPROPion ER (AUVELITY ) 45-105 MG TBCR Take 1 tablet by mouth 2 (two) times daily.    EPINEPHrine 0.3 mg/0.3 mL IJ SOAJ injection Inject 0.3 mg into the muscle as needed for anaphylaxis. 03/23/2021: Prn    estradiol  (CLIMARA  - DOSED IN MG/24 HR) 0.05 mg/24hr patch PLACE 1 PATCH (0.05 MG TOTAL) ONTO THE SKIN ONCE A WEEK.    hydrochlorothiazide  (HYDRODIURIL ) 12.5 MG tablet Take 1 tablet (12.5 mg total) by mouth daily.    ibuprofen  (ADVIL ) 800 MG tablet Take 1 tablet (800 mg total) by mouth every 8 (eight) hours as needed for moderate pain. For AFTER surgery only 03/23/2021: Prn    ondansetron  (ZOFRAN -ODT) 4 MG disintegrating tablet Take 4 mg by mouth every 8 (eight) hours as needed.    phentermine  (ADIPEX-P ) 37.5 MG tablet Take 1 tablet (37.5 mg total) by mouth daily before breakfast.    zolpidem  (AMBIEN ) 10 MG tablet Take 1 tablet (10 mg total) by mouth at bedtime as needed for sleep. for sleep    [DISCONTINUED] Dextromethorphan-buPROPion ER (AUVELITY ) 45-105 MG TBCR Take 1 tablet by mouth 2 (two) times daily.     [DISCONTINUED] phenazopyridine  (PYRIDIUM ) 200 MG tablet Take 1 tablet (200 mg total) by mouth 3 (three) times daily as needed for pain.    No facility-administered encounter medications on file as of 11/21/2023.    Surgical History: Past Surgical History:  Procedure Laterality Date   CESAREAN SECTION     CHOLECYSTECTOMY     COLONOSCOPY WITH PROPOFOL  N/A 02/16/2021   Procedure: COLONOSCOPY WITH PROPOFOL ;  Surgeon: Unk Corinn Skiff, MD;  Location: [facility] ENDOSCOPY;  Service: Gastroenterology;  Laterality: N/A;   NOSE SURGERY     Surgery for deviated septum   OTHER SURGICAL HISTORY     Mesh placement for bladder prolapse versus urinary incontinence   ROBOTIC ASSISTED BILATERAL SALPINGO OOPHERECTOMY N/A 03/03/2021   Procedure: XI ROBOTIC ASSISTED BILATERAL SALPINGO OOPHORECTOMY,MINI LAPAROTOMY AND OMENTECTOMY, VAGINAL MASS EXCISION;  Surgeon: Viktoria Comer SAUNDERS, MD;  Location: [facility];  Service: Gynecology;  Laterality: N/A;   VAGINAL HYSTERECTOMY      Medical History: Past Medical History:  Diagnosis Date   Allergy    Anxiety    Depression    Gout 2020   none since then   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 in 61s and a second time in his 74s   Brain cancer Brother 28   Breast cancer Paternal Aunt        dx. 60s   Non-Hodgkin's lymphoma Paternal Aunt        dx. 60s   Thyroid  cancer Paternal Aunt        dx. 50s   Melanoma Paternal Aunt        dx. 61s   Pancreatic cancer Paternal Uncle 51   Gastric cancer Paternal Uncle 52   Prostate cancer Paternal Uncle 33   Breast cancer Maternal Grandmother    Lung cancer Paternal Grandmother        smoked, dx. 42s   Lung cancer Paternal Grandfather         smoked, dx. 87s   Breast cancer Cousin        pat cousin, dx. 49s    Social History   Socioeconomic History   Marital status: Married    Spouse name: Not on file   Number of children: Not on file   Years of education: Not on file   Highest education level: Not on file  Occupational History   Not on file  Tobacco Use   Smoking status: Never    Passive exposure: Past   Smokeless tobacco: Never  Vaping Use   Vaping status: Never Used  Substance and Sexual Activity   Alcohol use: Yes    Comment: ocassionally   Drug use: Never   Sexual activity: Not on file  Other Topics Concern   Not on file  Social History Narrative   Not on file   Social Drivers of Health   Financial Resource Strain: Low Risk  (05/31/2023)   Received from [facility]   Overall Financial Resource Strain (CARDIA)    Difficulty of Paying Living Expenses: Not hard at all  Food Insecurity: No Food Insecurity (05/31/2023)   Received from [facility]   Hunger Vital Sign    Within the past 12 months, you worried that your food would run out before you got the money to buy more.: Never true    Within the past 12 months, the food you bought just didn't last and you didn't have money to get more.: Never true  Transportation Needs: No Transportation Needs (05/31/2023)   Received from [facility] Transportation    In the past 12 months, has lack of transportation kept you from medical appointments or from getting medications?: No    Lack of Transportation (Non-Medical): No  Physical Activity: Not on file  Stress: Not on file  Social Connections: Not on file  Intimate Partner Violence: Not on file      Review of Systems  Constitutional:  Positive for fatigue.  Respiratory: Negative.  Negative for cough, chest tightness, shortness of breath and wheezing.   Cardiovascular: Negative.  Negative for chest pain and palpitations.  Gastrointestinal:  Negative.   Genitourinary:  Negative for dysuria, frequency and urgency.  Musculoskeletal:  Positive for arthralgias and back pain.  Neurological: Negative.     Vital Signs: BP 134/86   Pulse 99   Temp 98.1 F (36.7 C)   Resp 16   Ht 5' 3 (1.6 m)   Wt 148 lb 3.2 oz (67.2 kg)   SpO2 98%   BMI 26.25 kg/m    Physical Exam Vitals reviewed.  Constitutional:      Appearance: Normal appearance.  HENT:     Head: Normocephalic and atraumatic.   Eyes:     Pupils: Pupils are equal, round, and reactive to light.    Cardiovascular:     Rate and Rhythm: Normal rate and regular rhythm.  Pulmonary:     Effort: Pulmonary effort is normal. No respiratory distress.   Neurological:     Mental Status: She is alert and oriented to person, place, and time.   Psychiatric:        Mood and Affect: Mood normal.        Behavior: Behavior normal.        Assessment/Plan: 1. Right thyroid  nodule (Primary) Waiting for additional testing results from thyroid  biopsy  2. Family history of thyroid  cancer Waiting for additional testing results from thyroid  biopsy  3. Encounter for screening mammogram for malignant neoplasm of breast Routine mammogram ordered  - MM 3D SCREENING MAMMOGRAM BILATERAL BREAST; Future  4. Depression with anxiety Continue auvelity  as prescribed, refills ordered  - Dextromethorphan-buPROPion ER (AUVELITY ) 45-105 MG TBCR; Take 1 tablet by mouth 2 (two) times daily.  Dispense: 60 tablet; Refill: 3   General Counseling: Avery verbalizes understanding of the findings of todays visit and agrees with plan of treatment. I have discussed any further diagnostic evaluation that may be needed or ordered today. We also reviewed her medications today. she has been encouraged to call the office with any questions or concerns that should arise related to todays visit.    Orders Placed This Encounter  Procedures   MM 3D SCREENING MAMMOGRAM BILATERAL BREAST    Meds ordered  this encounter  Medications   Dextromethorphan-buPROPion ER (AUVELITY ) 45-105 MG TBCR    Sig: Take 1 tablet by mouth 2 (two) times daily.    Dispense:  60 tablet    Refill:  3    Return for previously scheduled , CPE, Brixton Schnapp PCP in late august. .   Total time spent:30 Minutes Time spent includes review of chart, medications, test results, and follow up plan with the patient.   [place] Controlled Substance Database was reviewed by me.  This patient was seen by Mardy Maxin, FNP-C in collaboration with Dr. Sigrid Bathe as a part of collaborative care agreement.   Mileydi Milsap R. Maxin, MSN, FNP-C Internal medicine

## 2023-11-22 ENCOUNTER — Encounter: Payer: Self-pay | Admitting: Nurse Practitioner

## 2023-11-24 ENCOUNTER — Encounter: Payer: Self-pay | Admitting: Radiology

## 2023-12-27 ENCOUNTER — Ambulatory Visit
Admission: RE | Admit: 2023-12-27 | Discharge: 2023-12-27 | Disposition: A | Discharge status: Home / Self Care | Source: Ambulatory Visit | Attending: Nurse Practitioner | Admitting: Nurse Practitioner

## 2023-12-27 DIAGNOSIS — Z1231 Encounter for screening mammogram for malignant neoplasm of breast: Secondary | ICD-10-CM | POA: Diagnosis present

## 2024-01-06 ENCOUNTER — Telehealth: Payer: Self-pay | Admitting: Nurse Practitioner

## 2024-01-06 NOTE — Telephone Encounter (Signed)
 Left vm and sent mychart message to confirm 01/12/24 appointment-Toni

## 2024-01-12 ENCOUNTER — Encounter: Payer: Self-pay | Admitting: Nurse Practitioner

## 2024-01-12 ENCOUNTER — Ambulatory Visit: Payer: 59 | Admitting: Nurse Practitioner

## 2024-01-12 VITALS — BP 136/82 | HR 91 | Temp 96.7°F | Resp 16 | Ht 63.0 in | Wt 145.8 lb

## 2024-01-12 DIAGNOSIS — E041 Nontoxic single thyroid nodule: Secondary | ICD-10-CM

## 2024-01-12 DIAGNOSIS — Z6827 Body mass index (BMI) 27.0-27.9, adult: Secondary | ICD-10-CM

## 2024-01-12 DIAGNOSIS — N390 Urinary tract infection, site not specified: Secondary | ICD-10-CM | POA: Diagnosis not present

## 2024-01-12 DIAGNOSIS — F5101 Primary insomnia: Secondary | ICD-10-CM

## 2024-01-12 DIAGNOSIS — E663 Overweight: Secondary | ICD-10-CM | POA: Diagnosis not present

## 2024-01-12 DIAGNOSIS — Z0001 Encounter for general adult medical examination with abnormal findings: Secondary | ICD-10-CM

## 2024-01-12 DIAGNOSIS — Z90710 Acquired absence of both cervix and uterus: Secondary | ICD-10-CM

## 2024-01-12 DIAGNOSIS — E559 Vitamin D deficiency, unspecified: Secondary | ICD-10-CM | POA: Diagnosis not present

## 2024-01-12 DIAGNOSIS — Z6825 Body mass index (BMI) 25.0-25.9, adult: Secondary | ICD-10-CM

## 2024-01-12 LAB — POCT URINALYSIS DIPSTICK
Bilirubin, UA: NEGATIVE
Blood, UA: NEGATIVE
Glucose, UA: NEGATIVE
Ketones, UA: NEGATIVE
Leukocytes, UA: NEGATIVE
Nitrite, UA: NEGATIVE
Protein, UA: POSITIVE — AB
Spec Grav, UA: 1.01 (ref 1.010–1.025)
Urobilinogen, UA: 0.2 U/dL
pH, UA: 5 (ref 5.0–8.0)

## 2024-01-12 MED ORDER — PHENTERMINE HCL 37.5 MG PO TABS: 37.5000 mg | ORAL_TABLET | Freq: Every day | ORAL | 2 refills | Status: DC

## 2024-01-12 MED ORDER — ZOLPIDEM TARTRATE 10 MG PO TABS: 10.0000 mg | ORAL_TABLET | Freq: Every evening | ORAL | 2 refills | Status: DC | PRN

## 2024-01-12 MED ORDER — NITROFURANTOIN MONOHYD MACRO 100 MG PO CAPS: 100.0000 mg | ORAL_CAPSULE | Freq: Two times a day (BID) | ORAL | 0 refills | Status: AC

## 2024-01-12 NOTE — Progress Notes (Signed)
 [facility], [place] [address] [place], [place] [address]  Internal MEDICINE  Office Visit Note  Patient Name: Lisa Osborn  [id number]  [id number]  Date of Service: 01/12/2024  Chief Complaint  Patient presents with   Depression   Hypertension   Hyperlipidemia   Annual Exam    HPI Lisa Osborn presents for an annual well visit and physical exam.  Well-appearing 58 y.o. female with hypertension, migraines and history of ovarian cancer s/p surgical removal.  --lives at home with family, currently retired from Agricultural consultant.  BP and other vital signs are normal Routine CRC screening: due in 2032 Routine mammogram: done in August this year  DEXA scan: done in 2017, osteopenia of femur neck, the rest was normal.  Pap smear: discontinued due to hysterectomy.  Labs: labs done in may, previously discussed, up to date.  New or worsening pain:  none  Other concerns:  Symptoms of UTI -- onset was about 4 days ago. Reports back pain, frequency, urgency, urinary discomfort, and pelvic pressure. Urinalysis was normal. Took AZO 2 days ago.    Current Medication: Outpatient Encounter Medications as of 01/12/2024  Medication Sig Note   nitrofurantoin , macrocrystal-monohydrate, (MACROBID ) 100 MG capsule Take 1 capsule (100 mg total) by mouth 2 (two) times daily for 7 days. Take with food    amLODipine  (NORVASC ) 10 MG tablet Take 1 tablet (10 mg total) by mouth daily.    Dextromethorphan-buPROPion ER (AUVELITY ) 45-105 MG TBCR Take 1 tablet by mouth 2 (two) times daily.    EPINEPHrine 0.3 mg/0.3 mL IJ SOAJ injection Inject 0.3 mg into the muscle as needed for anaphylaxis. 03/23/2021: Prn    estradiol  (CLIMARA  - DOSED IN MG/24 HR) 0.05 mg/24hr patch PLACE 1 PATCH (0.05 MG TOTAL) ONTO THE SKIN ONCE A WEEK.    hydrochlorothiazide  (HYDRODIURIL ) 12.5 MG tablet Take 1 tablet (12.5 mg total) by mouth daily.    ibuprofen  (ADVIL ) 800 MG tablet Take 1 tablet (800 mg total) by mouth every 8 (eight) hours as  needed for moderate pain. For AFTER surgery only 03/23/2021: Prn    ondansetron  (ZOFRAN -ODT) 4 MG disintegrating tablet Take 4 mg by mouth every 8 (eight) hours as needed.    phentermine  (ADIPEX-P ) 37.5 MG tablet Take 1 tablet (37.5 mg total) by mouth daily before breakfast.    zolpidem  (AMBIEN ) 10 MG tablet Take 1 tablet (10 mg total) by mouth at bedtime as needed for sleep. for sleep    [DISCONTINUED] phentermine  (ADIPEX-P ) 37.5 MG tablet Take 1 tablet (37.5 mg total) by mouth daily before breakfast.    [DISCONTINUED] zolpidem  (AMBIEN ) 10 MG tablet Take 1 tablet (10 mg total) by mouth at bedtime as needed for sleep. for sleep    No facility-administered encounter medications on file as of 01/12/2024.    Surgical History: Past Surgical History:  Procedure Laterality Date   CESAREAN SECTION     CHOLECYSTECTOMY     COLONOSCOPY WITH PROPOFOL  N/A 02/16/2021   Procedure: COLONOSCOPY WITH PROPOFOL ;  Surgeon: Unk Corinn Skiff, MD;  Location: [facility] ENDOSCOPY;  Service: Gastroenterology;  Laterality: N/A;   NOSE SURGERY     Surgery for deviated septum   OTHER SURGICAL HISTORY     Mesh placement for bladder prolapse versus urinary incontinence   ROBOTIC ASSISTED BILATERAL SALPINGO OOPHERECTOMY N/A 03/03/2021   Procedure: XI ROBOTIC ASSISTED BILATERAL SALPINGO OOPHORECTOMY,MINI LAPAROTOMY AND OMENTECTOMY, VAGINAL MASS EXCISION;  Surgeon: Viktoria Comer SAUNDERS, MD;  Location: [facility] ORS;  Service: Gynecology;  Laterality: N/A;   VAGINAL  HYSTERECTOMY      Medical History: Past Medical History:  Diagnosis Date   Allergy    Anxiety    Depression    Gout 2020   none since then   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 in 3s and  a second time in his 60s   Brain cancer Brother 45   Breast cancer Paternal Aunt        dx. 60s   Non-Hodgkin's lymphoma Paternal Aunt        dx. 85s   Thyroid  cancer Paternal Aunt        dx. 50s   Melanoma Paternal Aunt        dx. 82s   Pancreatic cancer Paternal Uncle 58   Gastric cancer Paternal Uncle 94   Prostate cancer Paternal Uncle 49   Breast cancer Maternal Grandmother    Lung cancer Paternal Grandmother        smoked, dx. 28s   Lung cancer Paternal Grandfather        smoked, dx. 76s   Breast cancer Cousin        pat cousin, dx. 63s    Social History   Socioeconomic History   Marital status: Married    Spouse name: Not on file   Number of children: Not on file   Years of education: Not on file   Highest education level: Not on file  Occupational History   Not on file  Tobacco Use   Smoking status: Never    Passive exposure: Past   Smokeless tobacco: Never  Vaping Use   Vaping status: Never Used  Substance and Sexual Activity   Alcohol use: Yes    Comment: ocassionally   Drug use: Never   Sexual activity: Not on file  Other Topics Concern   Not on file  Social History Narrative   Not on file   Social Drivers of Health   Financial Resource Strain: Low Risk  (05/31/2023)   Received from [facility]   Overall Financial Resource Strain (CARDIA)    Difficulty of Paying Living Expenses: Not hard at all  Food Insecurity: No Food Insecurity (05/31/2023)   Received from [facility]   Hunger Vital Sign    Within the past 12 months, you worried that your food would run out before you got the money to buy more.: Never true    Within the past 12 months, the food you bought just didn't last and you didn't have money to get more.: Never true  Transportation Needs: No Transportation Needs (05/31/2023)   Received from [facility] - Transportation    In the past 12 months, has lack of transportation kept  you from medical appointments or from getting medications?: No    Lack of Transportation (Non-Medical): No  Physical Activity: Not on file  Stress: Not on file  Social Connections: Not on file  Intimate Partner Violence: Not on file      Review of Systems  Constitutional:  Positive for fatigue. Negative for activity change, appetite change, chills, fever and unexpected weight change.  HENT: Negative.  Negative for congestion, ear pain, rhinorrhea, sore throat and trouble swallowing.   Eyes: Negative.   Respiratory: Negative.  Negative for cough, chest tightness, shortness of breath and wheezing.   Cardiovascular: Negative.  Negative for chest pain and palpitations.  Gastrointestinal: Negative.  Negative for abdominal pain, blood in stool, constipation, diarrhea, nausea and vomiting.  Endocrine: Positive for heat intolerance.  Genitourinary:  Positive for dysuria, flank pain, frequency, hematuria, pelvic pain and urgency. Negative for difficulty urinating.  Musculoskeletal:  Negative for arthralgias, back pain, joint swelling, myalgias and neck pain.  Skin: Negative.  Negative for rash and wound.  Allergic/Immunologic: Negative.  Negative for immunocompromised state.  Neurological: Negative.  Negative for dizziness, seizures, numbness and headaches.  Hematological: Negative.   Psychiatric/Behavioral:  Negative for behavioral problems, self-injury and suicidal ideas. The patient is not nervous/anxious.     Vital Signs: BP 136/82   Pulse 91   Temp (!) 96.7 F (35.9 C)   Resp 16   Ht 5' 3 (1.6 m)   Wt 145 lb 12.8 oz (66.1 kg)   SpO2 96%   BMI 25.83 kg/m    Physical Exam Vitals reviewed.  Constitutional:      General: She is awake. She is not in acute distress.    Appearance: Normal appearance. She is well-developed, well-groomed and normal weight. She is not ill-appearing or diaphoretic.  HENT:     Head: Normocephalic and atraumatic.     Right Ear: Tympanic membrane, ear  canal and external ear normal.     Left Ear: Tympanic membrane, ear canal and external ear normal.     Nose: Nose normal. No congestion or rhinorrhea.     Mouth/Throat:     Lips: Pink.     Mouth: Mucous membranes are moist.     Pharynx: Oropharynx is clear. Uvula midline. No oropharyngeal exudate.  Eyes:     General: Lids are normal. Vision grossly intact. Gaze aligned appropriately. No scleral icterus.       Right eye: No discharge.        Left eye: No discharge.     Conjunctiva/sclera: Conjunctivae normal.     Pupils: Pupils are equal, round, and reactive to light.     Funduscopic exam:    Right eye: Red reflex present.        Left eye: Red reflex present. Neck:     Thyroid : No thyromegaly.     Vascular: No JVD.     Trachea: Trachea and phonation normal. No tracheal deviation.  Cardiovascular:     Rate and Rhythm: Normal rate and regular rhythm.     Pulses: Normal pulses.     Heart sounds: Normal heart sounds, S1 normal and S2 normal. No murmur heard.    No friction rub. No gallop.  Pulmonary:     Effort: Pulmonary effort is normal. No accessory muscle usage or respiratory distress.     Breath sounds: Normal breath sounds and air entry. No stridor. No decreased breath sounds, wheezing or rales.  Chest:     Chest wall: No tenderness.     Comments: Still regularly seeing gynecologic oncology  Abdominal:     General: Bowel sounds are normal. There is no distension.     Palpations: Abdomen is soft. There is no mass.     Tenderness: There is no abdominal tenderness. There is no guarding or rebound.  Musculoskeletal:        General: No tenderness or deformity. Normal range of motion.  Cervical back: Normal range of motion and neck supple.     Right lower leg: No edema.     Left lower leg: No edema.  Lymphadenopathy:     Cervical: No cervical adenopathy.  Skin:    General: Skin is warm and dry.     Capillary Refill: Capillary refill takes less than 2 seconds.      Coloration: Skin is not pale.     Findings: No erythema or rash.  Neurological:     Mental Status: She is alert and oriented to person, place, and time.     Cranial Nerves: No cranial nerve deficit.     Motor: No abnormal muscle tone.     Coordination: Coordination normal.     Gait: Gait normal.     Deep Tendon Reflexes: Reflexes are normal and symmetric.  Psychiatric:        Mood and Affect: Mood normal.        Behavior: Behavior normal. Behavior is cooperative.        Thought Content: Thought content normal.        Judgment: Judgment normal.        Assessment/Plan: 1. Encounter for routine adult health examination with abnormal findings (Primary) Age-appropriate preventive screenings and vaccinations discussed, annual physical exam completed. Routine labs for health maintenance are up to date for now. PHM updated.    2. Urinary tract infection without hematuria, site unspecified Abnormal urinalysis in office, urine sent for culture. Nitrofurantoin  prescribed, take until gone.  - POCT urinalysis dipstick - CULTURE, URINE COMPREHENSIVE - nitrofurantoin , macrocrystal-monohydrate, (MACROBID ) 100 MG capsule; Take 1 capsule (100 mg total) by mouth 2 (two) times daily for 7 days. Take with food  Dispense: 14 capsule; Refill: 0  3. Right thyroid  nodule Continue periodic thyroid  level checks and thyroid  ultrasound as needed. She had a thyroid  nodule biopsy done in June that was [facility] category 3 and additional genetic testing was done on the sample and it was negative.   4. Vitamin D deficiency Continue OTC vitamin D supplement  5. Overweight with body mass index (BMI) of 25 to 25.9 in adult Continue phentermine  as prescribed, follow up in 3 months  - phentermine  (ADIPEX-P ) 37.5 MG tablet; Take 1 tablet (37.5 mg total) by mouth daily before breakfast.  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   7. Primary  insomnia Continue ambien  as prescribed. Follow up in 3 months for additional refills.  - zolpidem  (AMBIEN ) 10 MG tablet; Take 1 tablet (10 mg total) by mouth at bedtime as needed for sleep. for sleep  Dispense: 30 tablet; Refill: 2     General Counseling: Lilyana verbalizes understanding of the findings of todays visit and agrees with plan of treatment. I have discussed any further diagnostic evaluation that may be needed or ordered today. We also reviewed her medications today. she has been encouraged to call the office with any questions or concerns that should arise related to todays visit.    Orders Placed This Encounter  Procedures   CULTURE, URINE COMPREHENSIVE   POCT urinalysis dipstick    Meds ordered this encounter  Medications   zolpidem  (AMBIEN ) 10 MG tablet    Sig: Take 1 tablet (10 mg total) by mouth at bedtime as needed for sleep. for sleep    Dispense:  30 tablet    Refill:  2    For future refills, please keep on file with her other  refills   phentermine  (ADIPEX-P ) 37.5 MG tablet    Sig: Take 1 tablet (37.5 mg total) by mouth daily before breakfast.    Dispense:  30 tablet    Refill:  2    For future refill   nitrofurantoin , macrocrystal-monohydrate, (MACROBID ) 100 MG capsule    Sig: Take 1 capsule (100 mg total) by mouth 2 (two) times daily for 7 days. Take with food    Dispense:  14 capsule    Refill:  0    Fill new script today    Return in about 12 weeks (around 04/05/2024) for F/U, Chinelo Benn PCP ambien .   Total time spent:30 Minutes Time spent includes review of chart, medications, test results, and follow up plan with the patient.   [place] Controlled Substance Database was reviewed by me.  This patient was seen by Mardy Maxin, FNP-C in collaboration with Dr. Sigrid Bathe as a part of collaborative care agreement.  Quynn Vilchis R. Maxin, MSN, FNP-C Internal medicine

## 2024-01-17 LAB — CULTURE, URINE COMPREHENSIVE

## 2024-02-25 ENCOUNTER — Encounter: Payer: Self-pay | Admitting: Nurse Practitioner

## 2024-02-25 DIAGNOSIS — Z90710 Acquired absence of both cervix and uterus: Secondary | ICD-10-CM | POA: Insufficient documentation

## 2024-02-25 DIAGNOSIS — E559 Vitamin D deficiency, unspecified: Secondary | ICD-10-CM | POA: Insufficient documentation

## 2024-03-06 ENCOUNTER — Other Ambulatory Visit: Payer: Self-pay | Admitting: Nurse Practitioner

## 2024-03-06 DIAGNOSIS — F418 Other specified anxiety disorders: Secondary | ICD-10-CM

## 2024-04-02 ENCOUNTER — Ambulatory Visit (INDEPENDENT_AMBULATORY_CARE_PROVIDER_SITE_OTHER): Admitting: Nurse Practitioner

## 2024-04-02 ENCOUNTER — Encounter: Payer: Self-pay | Admitting: Nurse Practitioner

## 2024-04-02 VITALS — BP 134/86 | HR 89 | Temp 96.4°F | Resp 16 | Ht 63.0 in | Wt 147.2 lb

## 2024-04-02 DIAGNOSIS — E663 Overweight: Secondary | ICD-10-CM

## 2024-04-02 DIAGNOSIS — F5101 Primary insomnia: Secondary | ICD-10-CM

## 2024-04-02 DIAGNOSIS — I10 Essential (primary) hypertension: Secondary | ICD-10-CM

## 2024-04-02 DIAGNOSIS — Z6826 Body mass index (BMI) 26.0-26.9, adult: Secondary | ICD-10-CM

## 2024-04-02 MED ORDER — PHENTERMINE HCL 37.5 MG PO TABS: 37.5000 mg | ORAL_TABLET | Freq: Every day | ORAL | 2 refills | Status: AC

## 2024-04-02 MED ORDER — HYDROCHLOROTHIAZIDE 12.5 MG PO TABS: 12.5000 mg | ORAL_TABLET | Freq: Every day | ORAL | 3 refills | Status: AC

## 2024-04-02 MED ORDER — ZOLPIDEM TARTRATE 10 MG PO TABS: 10.0000 mg | ORAL_TABLET | Freq: Every evening | ORAL | 2 refills | Status: AC | PRN

## 2024-04-02 NOTE — Progress Notes (Signed)
 [facility] [address] [place], [place] [address]  Internal MEDICINE  Office Visit Note  Patient Name: Lisa Osborn  [id number]  [id number]  Date of Service: 04/02/2024  Chief Complaint  Patient presents with   Depression   Hypertension   Hyperlipidemia   Follow-up    HPI Autumm presents for a follow-up visit for hypertension, weight loss, and insomnia  Hypertension -- controlled with amlodipine  and hydrochlorothiazide  as prescribed.  Weight loss -- appetite controlled with phentermine , weight is stable.  Insomnia -- takes ambienat bedtime, due for refills.      Current Medication: Outpatient Encounter Medications as of 04/02/2024  Medication Sig Note   amLODipine  (NORVASC ) 10 MG tablet Take 1 tablet (10 mg total) by mouth daily.    AUVELITY  45-105 MG TBCR TAKE 1 TABLET BY MOUTH TWICE DAILY    EPINEPHrine 0.3 mg/0.3 mL IJ SOAJ injection Inject 0.3 mg into the muscle as needed for anaphylaxis. 03/23/2021: Prn    hydrochlorothiazide  (HYDRODIURIL ) 12.5 MG tablet Take 1 tablet (12.5 mg total) by mouth daily.    ibuprofen  (ADVIL ) 800 MG tablet Take 1 tablet (800 mg total) by mouth every 8 (eight) hours as needed for moderate pain. For AFTER surgery only 03/23/2021: Prn    phentermine  (ADIPEX-P ) 37.5 MG tablet Take 1 tablet (37.5 mg total) by mouth daily before breakfast.    zolpidem  (AMBIEN ) 10 MG tablet Take 1 tablet (10 mg total) by mouth at bedtime as needed for sleep. for sleep    [DISCONTINUED] estradiol  (CLIMARA  - DOSED IN MG/24 HR) 0.05 mg/24hr patch PLACE 1 PATCH (0.05 MG TOTAL) ONTO THE SKIN ONCE A WEEK. (Patient not taking: Reported on 04/02/2024)    [DISCONTINUED] hydrochlorothiazide  (HYDRODIURIL ) 12.5 MG tablet Take 1 tablet (12.5 mg total) by mouth daily.    [DISCONTINUED] ondansetron  (ZOFRAN -ODT) 4 MG disintegrating tablet Take 4 mg by mouth every 8 (eight) hours as needed. (Patient not taking: Reported on 04/02/2024)    [DISCONTINUED] phentermine   (ADIPEX-P ) 37.5 MG tablet Take 1 tablet (37.5 mg total) by mouth daily before breakfast.    [DISCONTINUED] zolpidem  (AMBIEN ) 10 MG tablet Take 1 tablet (10 mg total) by mouth at bedtime as needed for sleep. for sleep    No facility-administered encounter medications on file as of 04/02/2024.    Surgical History: Past Surgical History:  Procedure Laterality Date   CESAREAN SECTION     CHOLECYSTECTOMY     COLONOSCOPY WITH PROPOFOL  N/A 02/16/2021   Procedure: COLONOSCOPY WITH PROPOFOL ;  Surgeon: Unk Corinn Skiff, MD;  Location: [facility] ENDOSCOPY;  Service: Gastroenterology;  Laterality: N/A;   NOSE SURGERY     Surgery for deviated septum   OTHER SURGICAL HISTORY     Mesh placement for bladder prolapse versus urinary incontinence   ROBOTIC ASSISTED BILATERAL SALPINGO OOPHERECTOMY N/A 03/03/2021   Procedure: XI ROBOTIC ASSISTED BILATERAL SALPINGO OOPHORECTOMY,MINI LAPAROTOMY AND OMENTECTOMY, VAGINAL MASS EXCISION;  Surgeon: Viktoria Comer SAUNDERS, MD;  Location: [facility];  Service: Gynecology;  Laterality: N/A;   VAGINAL HYSTERECTOMY      Medical History: Past Medical History:  Diagnosis Date   Allergy    Anxiety    Depression    Gout 2020   none since then   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 in 58s and a second time in his 60s   Brain cancer Brother 21   Breast cancer Paternal Aunt        dx. 60s   Non-Hodgkin's lymphoma Paternal Aunt        dx. 65s   Thyroid  cancer Paternal Aunt        dx. 50s   Melanoma Paternal Aunt        dx. 81s   Pancreatic cancer Paternal Uncle 41   Gastric cancer Paternal Uncle 43   Prostate cancer Paternal Uncle 42   Breast cancer Maternal Grandmother    Lung cancer Paternal  Grandmother        smoked, dx. 58s   Lung cancer Paternal Grandfather        smoked, dx. 64s   Breast cancer Cousin        pat cousin, dx. 5s    Social History   Socioeconomic History   Marital status: Married    Spouse name: Not on file   Number of children: Not on file   Years of education: Not on file   Highest education level: Not on file  Occupational History   Not on file  Tobacco Use   Smoking status: Never    Passive exposure: Past   Smokeless tobacco: Never  Vaping Use   Vaping status: Never Used  Substance and Sexual Activity   Alcohol use: Yes    Comment: ocassionally   Drug use: Never   Sexual activity: Not on file  Other Topics Concern   Not on file  Social History Narrative   Not on file   Social Drivers of Health   Financial Resource Strain: Low Risk  (05/31/2023)   Received from [facility]   Overall Financial Resource Strain (CARDIA)    Difficulty of Paying Living Expenses: Not hard at all  Food Insecurity: No Food Insecurity (05/31/2023)   Received from [facility]   Hunger Vital Sign    Within the past 12 months, you worried that your food would run out before you got the money to buy more.: Never true    Within the past 12 months, the food you bought just didn't last and you didn't have money to get more.: Never true  Transportation Needs: No Transportation Needs (05/31/2023)   Received from [facility] - Transportation    In the past 12 months, has lack of transportation kept you from medical appointments or from getting medications?: No    Lack of Transportation (Non-Medical): No  Physical Activity: Not on file  Stress: Not on file  Social Connections: Not on file  Intimate Partner Violence: Not on file      Review of Systems  Constitutional:  Positive for activity change, diaphoresis (night sweats), fatigue and unexpected weight change. Negative for chills.  HENT:  Positive  for postnasal drip. Negative for congestion, rhinorrhea, sneezing and sore throat.   Eyes:  Negative for redness.  Respiratory: Negative.  Negative for cough, chest tightness, shortness of breath and wheezing.   Cardiovascular: Negative.  Negative for chest pain and palpitations.  Gastrointestinal:  Negative for abdominal pain, constipation, diarrhea, nausea and vomiting.  Endocrine: Positive for heat intolerance (hot flashes).  Genitourinary: Negative.  Negative for dysuria and frequency.  Musculoskeletal: Negative.  Negative for arthralgias, back pain, joint swelling  and neck pain.  Skin:  Negative for rash.  Neurological:  Positive for weakness. Negative for tremors and numbness.  Hematological:  Negative for adenopathy. Does not bruise/bleed easily.  Psychiatric/Behavioral:  Positive for behavioral problems (Depression), dysphoric mood and sleep disturbance. Negative for self-injury and suicidal ideas. The patient is nervous/anxious.     Vital Signs: BP 134/86   Pulse 89   Temp (!) 96.4 F (35.8 C)   Resp 16   Ht 5' 3 (1.6 m)   Wt 147 lb 3.2 oz (66.8 kg)   SpO2 98%   BMI 26.08 kg/m    Physical Exam Vitals reviewed.  Constitutional:      General: She is not in acute distress.    Appearance: Normal appearance. She is well-developed, well-groomed and overweight. She is not ill-appearing.  HENT:     Head: Normocephalic and atraumatic.  Eyes:     Pupils: Pupils are equal, round, and reactive to light.  Cardiovascular:     Rate and Rhythm: Normal rate and regular rhythm.  Pulmonary:     Effort: Pulmonary effort is normal. No respiratory distress.  Neurological:     Mental Status: She is alert and oriented to person, place, and time.  Psychiatric:        Attention and Perception: Attention normal.        Mood and Affect: Mood and affect normal.        Speech: Speech normal.        Behavior: Behavior normal. Behavior is cooperative.        Thought Content: Thought content  normal.        Judgment: Judgment normal.        Assessment/Plan: 1. Essential hypertension (Primary) Stable, continue amlodipine  and hydrochlorothiazide  as prescribed.  - hydrochlorothiazide  (HYDRODIURIL ) 12.5 MG tablet; Take 1 tablet (12.5 mg total) by mouth daily.  Dispense: 90 tablet; Refill: 3  2. Overweight with body mass index (BMI) of 26 to 26.9 in adult Continue phentermine  as prescribed. Follow up in a few months for refills.  - phentermine  (ADIPEX-P ) 37.5 MG tablet; Take 1 tablet (37.5 mg total) by mouth daily before breakfast.  Dispense: 30 tablet; Refill: 2  3. Primary insomnia Continue ambien  as prescribed, follow up in 3 months for additional refills.  - zolpidem  (AMBIEN ) 10 MG tablet; Take 1 tablet (10 mg total) by mouth at bedtime as needed for sleep. for sleep  Dispense: 30 tablet; Refill: 2   General Counseling: Hibah verbalizes understanding of the findings of todays visit and agrees with plan of treatment. I have discussed any further diagnostic evaluation that may be needed or ordered today. We also reviewed her medications today. she has been encouraged to call the office with any questions or concerns that should arise related to todays visit.    No orders of the defined types were placed in this encounter.   Meds ordered this encounter  Medications   zolpidem  (AMBIEN ) 10 MG tablet    Sig: Take 1 tablet (10 mg total) by mouth at bedtime as needed for sleep. for sleep    Dispense:  30 tablet    Refill:  2    For future refills, please keep on file with her other refills   hydrochlorothiazide  (HYDRODIURIL ) 12.5 MG tablet    Sig: Take 1 tablet (12.5 mg total) by mouth daily.    Dispense:  90 tablet    Refill:  3    For future refills   phentermine  (ADIPEX-P ) 37.5 MG  tablet    Sig: Take 1 tablet (37.5 mg total) by mouth daily before breakfast.    Dispense:  30 tablet    Refill:  2    For future refill    Return in about 3 months (around 06/27/2024)  for F/U, Luca Dyar PCP med refills .   Total time spent:30 Minutes Time spent includes review of chart, medications, test results, and follow up plan with the patient.   [place] Controlled Substance Database was reviewed by me.  This patient was seen by Mardy Maxin, FNP-C in collaboration with Dr. Sigrid Bathe as a part of collaborative care agreement.   Araya Roel R. Maxin, MSN, FNP-C Internal medicine

## 2024-04-24 ENCOUNTER — Encounter: Payer: Self-pay | Admitting: Nurse Practitioner

## 2024-07-02 ENCOUNTER — Ambulatory Visit: Admitting: Nurse Practitioner

## 2025-01-14 ENCOUNTER — Encounter: Admitting: Nurse Practitioner
